# Patient Record
Sex: Male | Born: 1935 | Race: Black or African American | Hispanic: No | Smoking: Former smoker
Health system: Southern US, Community
[De-identification: ages and names within clinical notes are randomized; demographics above are authoritative.]

## PROBLEM LIST (undated history)

## (undated) DIAGNOSIS — N183 Chronic kidney disease, stage 3 unspecified: Secondary | ICD-10-CM

## (undated) DIAGNOSIS — Z91148 Patient's other noncompliance with medication regimen for other reason: Secondary | ICD-10-CM

## (undated) DIAGNOSIS — I441 Atrioventricular block, second degree: Secondary | ICD-10-CM

## (undated) DIAGNOSIS — E039 Hypothyroidism, unspecified: Secondary | ICD-10-CM

## (undated) DIAGNOSIS — I2510 Atherosclerotic heart disease of native coronary artery without angina pectoris: Secondary | ICD-10-CM

## (undated) DIAGNOSIS — Z9114 Patient's other noncompliance with medication regimen: Secondary | ICD-10-CM

## (undated) DIAGNOSIS — I10 Essential (primary) hypertension: Secondary | ICD-10-CM

## (undated) DIAGNOSIS — E785 Hyperlipidemia, unspecified: Secondary | ICD-10-CM

## (undated) DIAGNOSIS — I340 Nonrheumatic mitral (valve) insufficiency: Secondary | ICD-10-CM

## (undated) DIAGNOSIS — H9190 Unspecified hearing loss, unspecified ear: Secondary | ICD-10-CM

## (undated) DIAGNOSIS — I5022 Chronic systolic (congestive) heart failure: Secondary | ICD-10-CM

## (undated) DIAGNOSIS — I4819 Other persistent atrial fibrillation: Secondary | ICD-10-CM

## (undated) DIAGNOSIS — I7300 Raynaud's syndrome without gangrene: Secondary | ICD-10-CM

## (undated) DIAGNOSIS — F4310 Post-traumatic stress disorder, unspecified: Secondary | ICD-10-CM

## (undated) HISTORY — PX: INGUINAL HERNIA REPAIR: SUR1180

## (undated) HISTORY — DX: Atrioventricular block, second degree: I44.1

## (undated) HISTORY — DX: Patient's other noncompliance with medication regimen for other reason: Z91.148

## (undated) HISTORY — PX: APPENDECTOMY: SHX54

## (undated) HISTORY — DX: Patient's other noncompliance with medication regimen: Z91.14

## (undated) HISTORY — DX: Other persistent atrial fibrillation: I48.19

## (undated) HISTORY — DX: Atherosclerotic heart disease of native coronary artery without angina pectoris: I25.10

## (undated) HISTORY — DX: Nonrheumatic mitral (valve) insufficiency: I34.0

## (undated) HISTORY — DX: Essential (primary) hypertension: I10

## (undated) HISTORY — DX: Hyperlipidemia, unspecified: E78.5

## (undated) HISTORY — PX: PROSTATECTOMY: SHX69

## (undated) HISTORY — PX: TONSILLECTOMY: SUR1361

---

## 1997-11-05 ENCOUNTER — Encounter (HOSPITAL_COMMUNITY): Admission: RE | Admit: 1997-11-05 | Discharge: 1998-02-03 | Payer: Self-pay | Admitting: Cardiology

## 1997-12-12 ENCOUNTER — Other Ambulatory Visit: Admission: RE | Admit: 1997-12-12 | Discharge: 1997-12-12 | Payer: Self-pay | Admitting: Cardiology

## 1998-02-03 ENCOUNTER — Encounter (HOSPITAL_COMMUNITY): Admission: RE | Admit: 1998-02-03 | Discharge: 1998-05-04 | Payer: Self-pay | Admitting: Cardiology

## 1998-05-05 ENCOUNTER — Encounter (HOSPITAL_COMMUNITY): Admission: RE | Admit: 1998-05-05 | Discharge: 1998-08-03 | Payer: Self-pay | Admitting: Cardiology

## 1998-05-23 ENCOUNTER — Other Ambulatory Visit: Admission: RE | Admit: 1998-05-23 | Discharge: 1998-05-23 | Payer: Self-pay | Admitting: Family Medicine

## 1998-06-05 ENCOUNTER — Ambulatory Visit (HOSPITAL_COMMUNITY): Admission: RE | Admit: 1998-06-05 | Discharge: 1998-06-05 | Payer: Self-pay | Admitting: Gastroenterology

## 1998-08-04 ENCOUNTER — Encounter (HOSPITAL_COMMUNITY): Admission: RE | Admit: 1998-08-04 | Discharge: 1998-11-02 | Payer: Self-pay | Admitting: Cardiology

## 1998-08-31 ENCOUNTER — Ambulatory Visit (HOSPITAL_BASED_OUTPATIENT_CLINIC_OR_DEPARTMENT_OTHER): Admission: RE | Admit: 1998-08-31 | Discharge: 1998-08-31 | Payer: Self-pay | Admitting: General Surgery

## 1998-09-02 ENCOUNTER — Emergency Department (HOSPITAL_COMMUNITY): Admission: EM | Admit: 1998-09-02 | Discharge: 1998-09-02 | Payer: Self-pay | Admitting: Emergency Medicine

## 1998-11-03 ENCOUNTER — Encounter (HOSPITAL_COMMUNITY): Admission: RE | Admit: 1998-11-03 | Discharge: 1999-02-01 | Payer: Self-pay | Admitting: Cardiology

## 1999-02-02 ENCOUNTER — Encounter (HOSPITAL_COMMUNITY): Admission: RE | Admit: 1999-02-02 | Discharge: 1999-03-05 | Payer: Self-pay | Admitting: Cardiology

## 1999-03-06 ENCOUNTER — Encounter (HOSPITAL_COMMUNITY): Admission: RE | Admit: 1999-03-06 | Discharge: 1999-06-04 | Payer: Self-pay | Admitting: Cardiology

## 1999-09-25 ENCOUNTER — Encounter: Admission: RE | Admit: 1999-09-25 | Discharge: 1999-09-25 | Payer: Self-pay | Admitting: Orthopedic Surgery

## 1999-09-25 ENCOUNTER — Encounter: Payer: Self-pay | Admitting: Orthopedic Surgery

## 2000-11-25 ENCOUNTER — Encounter: Payer: Self-pay | Admitting: Emergency Medicine

## 2000-11-25 ENCOUNTER — Emergency Department (HOSPITAL_COMMUNITY): Admission: EM | Admit: 2000-11-25 | Discharge: 2000-11-25 | Payer: Self-pay | Admitting: *Deleted

## 2000-11-28 ENCOUNTER — Emergency Department (HOSPITAL_COMMUNITY): Admission: EM | Admit: 2000-11-28 | Discharge: 2000-11-28 | Payer: Self-pay | Admitting: Emergency Medicine

## 2000-12-02 ENCOUNTER — Emergency Department (HOSPITAL_COMMUNITY): Admission: EM | Admit: 2000-12-02 | Discharge: 2000-12-02 | Payer: Self-pay | Admitting: Emergency Medicine

## 2001-03-04 ENCOUNTER — Encounter: Admission: RE | Admit: 2001-03-04 | Discharge: 2001-03-04 | Payer: Self-pay | Admitting: Orthopedic Surgery

## 2001-03-04 ENCOUNTER — Encounter: Payer: Self-pay | Admitting: Orthopedic Surgery

## 2001-08-05 HISTORY — PX: CARDIAC CATHETERIZATION: SHX172

## 2001-09-01 ENCOUNTER — Encounter: Payer: Self-pay | Admitting: Orthopedic Surgery

## 2001-09-01 ENCOUNTER — Encounter: Admission: RE | Admit: 2001-09-01 | Discharge: 2001-09-01 | Payer: Self-pay | Admitting: Orthopedic Surgery

## 2001-09-02 ENCOUNTER — Ambulatory Visit (HOSPITAL_BASED_OUTPATIENT_CLINIC_OR_DEPARTMENT_OTHER): Admission: RE | Admit: 2001-09-02 | Discharge: 2001-09-03 | Payer: Self-pay | Admitting: Orthopedic Surgery

## 2001-09-17 ENCOUNTER — Inpatient Hospital Stay (HOSPITAL_COMMUNITY): Admission: EM | Admit: 2001-09-17 | Discharge: 2001-09-19 | Payer: Self-pay | Admitting: Emergency Medicine

## 2001-09-17 ENCOUNTER — Encounter: Payer: Self-pay | Admitting: Emergency Medicine

## 2001-09-18 ENCOUNTER — Encounter: Payer: Self-pay | Admitting: Internal Medicine

## 2002-01-04 ENCOUNTER — Encounter: Payer: Self-pay | Admitting: Family Medicine

## 2002-01-04 ENCOUNTER — Ambulatory Visit (HOSPITAL_COMMUNITY): Admission: RE | Admit: 2002-01-04 | Discharge: 2002-01-04 | Payer: Self-pay | Admitting: Family Medicine

## 2003-09-19 ENCOUNTER — Encounter: Admission: RE | Admit: 2003-09-19 | Discharge: 2003-09-19 | Payer: Self-pay | Admitting: Orthopedic Surgery

## 2004-01-18 ENCOUNTER — Encounter: Admission: RE | Admit: 2004-01-18 | Discharge: 2004-01-18 | Payer: Self-pay | Admitting: Orthopedic Surgery

## 2004-01-20 ENCOUNTER — Ambulatory Visit (HOSPITAL_COMMUNITY): Admission: RE | Admit: 2004-01-20 | Discharge: 2004-01-20 | Payer: Self-pay | Admitting: Orthopedic Surgery

## 2004-01-20 ENCOUNTER — Ambulatory Visit (HOSPITAL_BASED_OUTPATIENT_CLINIC_OR_DEPARTMENT_OTHER): Admission: RE | Admit: 2004-01-20 | Discharge: 2004-01-20 | Payer: Self-pay | Admitting: Orthopedic Surgery

## 2004-10-01 ENCOUNTER — Ambulatory Visit (HOSPITAL_COMMUNITY): Admission: RE | Admit: 2004-10-01 | Discharge: 2004-10-01 | Payer: Self-pay | Admitting: Family Medicine

## 2006-11-04 ENCOUNTER — Emergency Department (HOSPITAL_COMMUNITY): Admission: EM | Admit: 2006-11-04 | Discharge: 2006-11-04 | Payer: Self-pay | Admitting: Emergency Medicine

## 2007-10-14 ENCOUNTER — Inpatient Hospital Stay (HOSPITAL_COMMUNITY): Admission: RE | Admit: 2007-10-14 | Discharge: 2007-10-19 | Payer: Self-pay | Admitting: Urology

## 2007-10-14 ENCOUNTER — Encounter (INDEPENDENT_AMBULATORY_CARE_PROVIDER_SITE_OTHER): Payer: Self-pay | Admitting: Urology

## 2007-11-03 ENCOUNTER — Emergency Department (HOSPITAL_COMMUNITY): Admission: EM | Admit: 2007-11-03 | Discharge: 2007-11-04 | Payer: Self-pay | Admitting: Emergency Medicine

## 2008-10-02 ENCOUNTER — Emergency Department (HOSPITAL_COMMUNITY): Admission: EM | Admit: 2008-10-02 | Discharge: 2008-10-02 | Payer: Self-pay | Admitting: Emergency Medicine

## 2009-01-02 ENCOUNTER — Emergency Department (HOSPITAL_COMMUNITY): Admission: EM | Admit: 2009-01-02 | Discharge: 2009-01-02 | Payer: Self-pay | Admitting: Emergency Medicine

## 2010-06-19 ENCOUNTER — Ambulatory Visit: Payer: Self-pay | Admitting: Cardiology

## 2010-07-12 ENCOUNTER — Ambulatory Visit: Payer: Self-pay | Admitting: Cardiology

## 2010-08-27 ENCOUNTER — Ambulatory Visit: Payer: Self-pay | Admitting: Cardiology

## 2010-09-10 ENCOUNTER — Ambulatory Visit (INDEPENDENT_AMBULATORY_CARE_PROVIDER_SITE_OTHER): Payer: Medicare Other | Admitting: *Deleted

## 2010-09-10 DIAGNOSIS — R079 Chest pain, unspecified: Secondary | ICD-10-CM

## 2010-09-20 ENCOUNTER — Telehealth (INDEPENDENT_AMBULATORY_CARE_PROVIDER_SITE_OTHER): Payer: Self-pay | Admitting: Radiology

## 2010-09-24 ENCOUNTER — Encounter: Payer: Self-pay | Admitting: Internal Medicine

## 2010-09-24 ENCOUNTER — Ambulatory Visit (HOSPITAL_COMMUNITY): Payer: Medicare Other | Attending: Cardiovascular Disease

## 2010-09-24 DIAGNOSIS — R0602 Shortness of breath: Secondary | ICD-10-CM

## 2010-09-24 DIAGNOSIS — I4949 Other premature depolarization: Secondary | ICD-10-CM

## 2010-09-24 DIAGNOSIS — R0989 Other specified symptoms and signs involving the circulatory and respiratory systems: Secondary | ICD-10-CM

## 2010-09-24 DIAGNOSIS — R079 Chest pain, unspecified: Secondary | ICD-10-CM | POA: Insufficient documentation

## 2010-09-26 NOTE — Progress Notes (Signed)
Summary: nuc pre-procedure  Phone Note Outgoing Call   Call placed by: Harlow Asa CNMT Call placed to: Patient Reason for Call: Confirm/change Appt Summary of Call: Left message with information on My[employer] Information Sheet (see scanned document for details).       Nuclear Med Background Indications for Stress Test: Evaluation for Ischemia   History: Heart Catheterization, Myocardial Infarction, Myocardial Perfusion Study  History Comments: MI- remote lateral MI; '03 Cath. occ. LCX- treated medically; '07 MPS EF=44% (-) isch.,large fixed defect; chronic mitral insuff.;CHF  Symptoms: Chest Pain, Dizziness, SOB    Nuclear Pre-Procedure Cardiac Risk Factors: Hypertension, Lipids

## 2010-10-02 NOTE — Assessment & Plan Note (Signed)
Summary: Cardiology Nuclear Testing  Nuclear Med Background Indications for Stress Test: Evaluation for Ischemia   History: Abnormal EKG, Heart Catheterization, Myocardial Infarction, Myocardial Perfusion Study  History Comments: MI- remote lateral MI; '03 Cath. occ. LCX- treated medically; '07 MPS EF=44% (-) isch.,large fixed defect; chronic mitral insuff.;CHF  Symptoms: Chest Pain, Dizziness, Palpitations, SOB    Nuclear Pre-Procedure Cardiac Risk Factors: Hypertension, Lipids Caffeine/Decaff Intake: None NPO After: 5:30 AM Lungs: clear IV 0.9% NS with Angio Cath: 20g     IV Site: R Hand IV Started by: Stanton Kidney, EMT-P Chest Size (in) 42     Height (in): 70.5 Weight (lb): 198 BMI: 28.11  Nuclear Med Study 1 or 2 day study:  1 day     Stress Test Type:  Eugenie Birks Reading MD:  Dietrich Pates, MD     Referring MD:  Waldon Reining Reade([place]) Resting Radionuclide:  Technetium 46m Tetrofosmin     Resting Radionuclide Dose:  10.9 mCi  Stress Radionuclide:  Technetium 50m Tetrofosmin     Stress Radionuclide Dose:  33 mCi   Stress Protocol  Max Systolic BP: 151 mm Hg Lexiscan: 0.4 mg   Stress Test Technologist:  Milana Na, EMT-P     Nuclear Technologist:  Domenic Polite, CNMT  Rest Procedure  Myocardial perfusion imaging was performed at rest 45 minutes following the intravenous administration of Technetium 95m Tetrofosmin.  Stress Procedure  The patient received IV Lexiscan 0.4 mg over 15-seconds.  2nd degree avb at baseline. Technetium 29m Tetrofosmin injected at 30-seconds.  There were no significant changes and rare pvcs with infusion.  Quantitative spect images were obtained after a 45 minute delay.  QPS Raw Data Images:  Diaphragm underlies heart.  Images were motion corrected Stress Images:  Defect in the inferior wall (base, mid, distal), lateral wall (base, mid), inferolateral wall (base, mid, distal) and thinning of apex. Rest Images:  no significant change  from the stress images. Subtraction (SDS):  No evidence of ischemia. Transient Ischemic Dilatation:  1.09  (Normal <1.22)  Lung/Heart Ratio:  .34  (Normal <0.45)  Quantitative Gated Spect Images QGS EDV:  163 ml QGS ESV:  97 ml QGS EF:  40 %   Overall Impression  Exercise Capacity: Lexiscan with no exercise. BP Response: Normal blood pressure response. Clinical Symptoms: Chest tightness ECG Impression: No significant ST segment change suggestive of ischemia. Overall Impression Comments: Scar and possible soft tissue attenuation in the inferior, inferolateral, lateral  wall.  No ischemia.

## 2010-10-17 ENCOUNTER — Other Ambulatory Visit: Payer: Self-pay | Admitting: Gastroenterology

## 2010-11-13 LAB — URINALYSIS, ROUTINE W REFLEX MICROSCOPIC
Bilirubin Urine: NEGATIVE
Ketones, ur: NEGATIVE mg/dL
Nitrite: NEGATIVE
pH: 5.5 (ref 5.0–8.0)

## 2010-11-13 LAB — URINE MICROSCOPIC-ADD ON

## 2010-11-20 LAB — COMPREHENSIVE METABOLIC PANEL
ALT: 22 U/L (ref 0–53)
Alkaline Phosphatase: 88 U/L (ref 39–117)
CO2: 21 mEq/L (ref 19–32)
Chloride: 106 mEq/L (ref 96–112)
GFR calc non Af Amer: 59 mL/min — ABNORMAL LOW (ref >60)
Glucose, Bld: 113 mg/dL — ABNORMAL HIGH (ref 70–99)
Potassium: 4.3 mEq/L (ref 3.5–5.1)
Sodium: 134 mEq/L — ABNORMAL LOW (ref 135–145)
Total Bilirubin: 0.5 mg/dL (ref 0.3–1.2)

## 2010-11-20 LAB — CBC
Hemoglobin: 15.9 g/dL (ref 13.0–17.0)
MCHC: 33.9 g/dL (ref 30.0–36.0)
RBC: 5.62 MIL/uL (ref 4.22–5.81)
WBC: 8 10*3/uL (ref 4.0–10.5)

## 2010-11-20 LAB — POCT I-STAT, CHEM 8
Calcium, Ion: 1.04 mmol/L — ABNORMAL LOW (ref 1.12–1.32)
Chloride: 109 mEq/L (ref 96–112)
Creatinine, Ser: 1.3 mg/dL (ref 0.4–1.5)
Glucose, Bld: 121 mg/dL — ABNORMAL HIGH (ref 70–99)
HCT: 51 % (ref 39.0–52.0)
Potassium: 4.4 mEq/L (ref 3.5–5.1)

## 2010-11-20 LAB — PROTIME-INR: Prothrombin Time: 13 seconds (ref 11.6–15.2)

## 2010-11-20 LAB — DIFFERENTIAL
Basophils Relative: 1 % (ref 0–1)
Eosinophils Absolute: 0.1 10*3/uL (ref 0.0–0.7)
Eosinophils Relative: 1 % (ref 0–5)
Neutrophils Relative %: 61 % (ref 43–77)

## 2010-11-20 LAB — URINE CULTURE

## 2010-11-20 LAB — LIPASE, BLOOD: Lipase: 26 U/L (ref 11–59)

## 2010-11-20 LAB — URINE MICROSCOPIC-ADD ON

## 2010-11-20 LAB — URINALYSIS, ROUTINE W REFLEX MICROSCOPIC
Bilirubin Urine: NEGATIVE
Glucose, UA: NEGATIVE mg/dL
Hgb urine dipstick: NEGATIVE
Protein, ur: 100 mg/dL — AB
Specific Gravity, Urine: 1.03 (ref 1.005–1.030)
Urobilinogen, UA: 0.2 mg/dL (ref 0.0–1.0)

## 2010-12-12 ENCOUNTER — Encounter: Payer: Self-pay | Admitting: Cardiology

## 2010-12-12 DIAGNOSIS — E785 Hyperlipidemia, unspecified: Secondary | ICD-10-CM | POA: Insufficient documentation

## 2010-12-12 DIAGNOSIS — R42 Dizziness and giddiness: Secondary | ICD-10-CM | POA: Insufficient documentation

## 2010-12-12 DIAGNOSIS — I2510 Atherosclerotic heart disease of native coronary artery without angina pectoris: Secondary | ICD-10-CM | POA: Insufficient documentation

## 2010-12-12 DIAGNOSIS — I7300 Raynaud's syndrome without gangrene: Secondary | ICD-10-CM | POA: Insufficient documentation

## 2010-12-12 DIAGNOSIS — I252 Old myocardial infarction: Secondary | ICD-10-CM | POA: Insufficient documentation

## 2010-12-12 DIAGNOSIS — I509 Heart failure, unspecified: Secondary | ICD-10-CM | POA: Insufficient documentation

## 2010-12-12 DIAGNOSIS — I441 Atrioventricular block, second degree: Secondary | ICD-10-CM | POA: Insufficient documentation

## 2010-12-12 DIAGNOSIS — I340 Nonrheumatic mitral (valve) insufficiency: Secondary | ICD-10-CM | POA: Insufficient documentation

## 2010-12-12 DIAGNOSIS — I10 Essential (primary) hypertension: Secondary | ICD-10-CM | POA: Insufficient documentation

## 2010-12-17 ENCOUNTER — Ambulatory Visit: Payer: Medicare Other | Admitting: Cardiology

## 2010-12-18 NOTE — Discharge Summary (Signed)
NAMEVEARL, ALLBAUGH              ACCOUNT NO.:  [id number]   MEDICAL RECORD NO.:  [id number]          PATIENT TYPE:  INP   LOCATION:  1512                         FACILITY:  [facility]   PHYSICIAN:  Boston Service, M.D.DATE OF BIRTH:  [birth year]   DATE OF ADMISSION:  10/14/2007  DATE OF DISCHARGE:  10/19/2007                               DISCHARGE SUMMARY   Indications, past history, medications, allergies, past medical history,  past social history, physical exam and review of systems were all  outlined in the admitting note.   HOSPITAL COURSE:  The patient was admitted October 14, 2007.  An 77-year-  old male with 80 gram prostate, urinary retention, postvoid residual of  [id number] mL, underwent TURP.  Due to his elevated postvoid, a Bonanno SP  tube was left in at the time of surgery.  Postop day #1, the patient  managed to pull out his Ainsworth catheter, which had been used for  continuous bladder irrigation in through the [place] tube and out  through the [place].  It was discarded and replaced with a three-way  Foley catheter.  Bleeding, which had occurred at the time of the patient  pulling out the catheter, resolved with a three-way catheter.  By postop  day #2, we were able to discontinue the continuous bladder irrigation  and by postop day #3, Foley catheter was discontinued at that point, the  patient refused to go home.  He voided on his own on several occasions  and the suprapubic tube was used to check the postvoid residuals.  Voided volumes ranged between about 300 and 600 mL, postvoids strange  between about 300 and in 500 mL.  By postop day #5, the patient was  voiding increasing volumes, residuals were decreasing and it was felt  that combination of his age, 65 years, and hypotonic bladder was slowly  resolving.  He was discharged home October 19, 2007.  Final path on tissue  removed returned BPH.   DISCHARGE MEDICATIONS:  Vicodin, Macrobid with p.r.n. finasteride  and  Flomax.   PLAN:  Will see him back in the office in about a week with a voiding  diary.  Will make a decision at that time about Bonanno SP tube removal.           ______________________________  Boston Service, M.D.     RH/MEDQ  D:  10/29/2007  T:  10/30/2007  Job:  [id number]   cc:   Peter M. [place], M.D.  Fax: [phone]

## 2010-12-18 NOTE — Op Note (Signed)
NAMEDUFF, POZZI              ACCOUNT NO.:  [id number]   MEDICAL RECORD NO.:  [id number]          PATIENT TYPE:  INP   LOCATION:  0004                         FACILITY:  [facility]   PHYSICIAN:  Boston Service, M.D.DATE OF BIRTH:  May 14, 1919   DATE OF PROCEDURE:  10/14/2007  DATE OF DISCHARGE:                               OPERATIVE REPORT   I have reviewed office notes from September 29, 2007.  Appreciate  preoperative clearance by Peter Swaziland, M.D.   PREOPERATIVE DIAGNOSES:  1. Benign prostatic hypertrophy.  2. Urinary retention, postvoid residuals between 800 and 1000 mL      despite Avodart and Flomax therapy.  3. Previous biopsy x3 showed an enlarged, 80 grams, prostate, but no      evidence of cancer.  The PSA pattern recently 11.4, 12.2,  11.8, 12, and 12.7.   POSTOPERATIVE DIAGNOSES:  1. Benign prostatic hypertrophy.  2. Urinary retention, postvoid residuals between 800 and 1000 mL      despite Avodart and Flomax therapy.  3. Previous biopsy x3 showed an enlarged, 80 grams, prostate, but no      evidence of cancer.  The PSA pattern recently 11.4, 12.2,  11.8, 12, and 12.7.   PROCEDURE:  1. Cystoscopy.  2. Bonanno SP tube placement.  3. Transurethral resection of the prostate.  4. Resection and vaporization.  Gyrus  system utilized.   SURGEON:  Boston Service, M.D.   ANESTHESIA:  Spinal.   FINDINGS:  Benign prostatic hypertrophy.   SPECIMENS:  Transurethral resection chips.   ESTIMATED BLOOD LOSS:  Per Anesthesia,  less than 100 mL.   COMPLICATIONS:  None obvious.   DESCRIPTION OF PROCEDURE:  The patient was prepped and draped in the  dorsal lithotomy position after institution of adequate level of general  anesthesia.  Well lubricated 21-French panendoscope was gently inserted  at the urethral meatus.  Normal urethra and sphincter, elongated  prostatic urethra, coapting lateral lobes, densely trabeculated, large  capacity bladder.  Difficulty  visualizing orifices mainly due to  enlarged median tissue.  With the patient in steep Trendelenburg  position, bladder was filled to capacity, about 1000 mL.  Spinal needle  was passed one fingerbreadth above the symphysis pubis with immediate  return of urine.  Bonanno SP tube was then passed adjacent to the spinal  needle with immediate return of urine, advanced into the bladder  position.  Anterior bladder wall confirmed cystoscopically, sewn in  place with 4-0 nylon.   Resectoscope sheath was then inserted.  Resection was initiated with a  single furrow at the 6 o'clock position to allow free efflux of chips.  Gyrus  bipolar system was used.  Resection was then carried from the 10  o'clock position down to the 6 o'clock position on the right lobe, from  the 2 o'clock position on the left lobe, down to the 6 o'clock position.  Small amount of tissue was resected anteriorly and the capsular  perforations were noted.  Once resection had been carried down to the  capsule, the loop element was replaced with the Gyrus nob  which on  the  cautery setting was used to smooth out the floor of the prostatic  urethra and obtain adequate hemostasis.  Chips were then irrigated free  from the bladder.  Bladder was filled to capacity.  Resectoscope sheath  was withdrawn.  Ainsworth catheter was inserted over a stylet with  immediate return of several hundred ml of irrigant, 30 mL in the  balloon.  Catheter was left to light traction.  Continuous bladder  irrigation set up into the [place] and out through the [place].  The  patient returned to recovery in satisfactory condition.           ______________________________  Boston Service, M.D.     RH/MEDQ  D:  10/14/2007  T:  10/15/2007  Job:  [id number]   cc:   Peter M. [place], M.D.  Fax: [phone]

## 2010-12-21 NOTE — Op Note (Signed)
[place]  Patient:    HEBER, HOOG Visit Number: [id number] MRN: [id number]          Service Type: DSU Location: [facility] Attending Physician:  Teena Dunk Dictated by:   Sharlot Gowda., M.D. Proc. Date: 09/02/01 Admit Date:  09/02/2001                             Operative Report  PREOPERATIVE DIAGNOSES: 1. Severe rotator cuff tear, right shoulder. 2. Degenerative tear in anterior and superior labrum. 3. Impingement and acromioclavicular joint arthritis.  POSTOPERATIVE DIAGNOSES: 1. Severe rotator cuff tear, right shoulder. 2. Degenerative tear in anterior and superior labrum. 3. Impingement and acromioclavicular joint arthritis.  OPERATION: 1. Open rotator cuff repair with acromioplasty. 2. Laparoscopic debridement of torn labrum. 3. Open distal clavicle excision.  SURGEON:  Sharlot Gowda., M.D.  ASSISTANT:  Arnoldo Morale, P.A.  INDICATIONS:  This is an 77 year old male with a severe rotator cuff tear not responding to conservative treatment, thought to be amenable to repair as an overnight.  DESCRIPTION OF PROCEDURE:  Arthroscope through posterior and anterior portal. Systematic inspection of the shoulder showed a severe rotator cuff tear with a complete tear of the supraspinatus and a portion of the infraspinatus, not really off the bone.  There was a sleeve of tissue left.  There was a degenerative tear of the anterior and superior labrum that was debrided.  Cuff tissue given the patients age was relatively good, although it was fibrillated on the under surface as well with some horizontal cleavage component.  After arthroscopy and debridement of the torn labrum, the procedure was converted to an open procedure with excision of an extremely large hypertrophic AC joint.  Additionally, the acromion was extremely thickened.  It was resected of about 1 cm of bone on its under surface and distal 1.5 cm  of the clavicle was excised.  This revealed a large cuff tear. The edges were not severely retracted.  The edges were freshened.  Two Arthrex anchors were placed, each with two #2 fiberwire sutures.  Prior to this, an apex stitch, the orientation of which was anterior-to-posterior was placed and left alone.  Two anchors were placed, each with two sutures for a total of four sutures, and repaired the cuff tissue back down to roughened bone.  This was followed by oversewing of the cuff back to the greater tuberosity area with #2 fiberwire.  The deltoid was meticulously repaired with #1 Tycron including one suture through drill holes, subcutaneous tissues with Vicryl, and the skin with the stapling device.  Marcaine with epinephrine infiltrated in the wound, 0.25%.  The patient had a preoperative stellate block.  He was taken to the recovery room in stable condition. Dictated by:   Sharlot Gowda., M.D. Attending Physician:  Teena Dunk DD:  09/02/01 TD:  09/03/01 Job: [id number]

## 2010-12-21 NOTE — Op Note (Signed)
NAME:  Craig [name], Craig [name]                        ACCOUNT NO.:  [id number]   MEDICAL RECORD NO.:  [id number]                   PATIENT TYPE:  AMB   LOCATION:  DSC                                  FACILITY:  [facility]   PHYSICIAN:  Thera Flake., M.D.             DATE OF BIRTH:  29-Apr-1919   DATE OF PROCEDURE:  01/20/2004  DATE OF DISCHARGE:                                 OPERATIVE REPORT   INDICATIONS:  This is an 77 year old male with symptomatic rotator cuff tear  for greater than six months, thought to be amenable to outpatient surgery  and overnight hospitalization.   PREOPERATIVE DIAGNOSES:  1. Large anterior tear, supraspinatus tendon.  2. Degenerative tear in the anterior-superior labrum.  3. Severe impingement with acromioclavicular joint arthritis.   POSTOPERATIVE DIAGNOSES:  1. Large anterior tear, supraspinatus tendon.  2. Degenerative tear in the anterior-superior labrum.  3. Severe impingement with acromioclavicular joint arthritis.   OPERATION:  1. Open rotator cuff tear with acromioplasty.  2. Arthroscopic debridement, torn labrum.  3. Open distal clavicle excision.   SURGEON:  Dyke Brackett, M.D.   ASSISTANTChestine Spore, P.A.   ANESTHESIA:  General/block.   DESCRIPTION OF PROCEDURE:  Arthroscope in the beach chair position after  general anesthesia and a block.  Large cuff tear was identified.  There was  degenerative tear in the anterior-superior labrum.  The biceps tendon was  degenerative but intact although questionable viability relative to  function.  Subscapularis was intact.  No severe degenerative change of the  joint, but rather significant fraying of the labrum.  Debridement of the  labrum as well as the undersurface of the cuff.  Advanced bony hypertrophy  of the [facility] joint and acromion was noted.  The procedure was next converted  into an open procedure.  Incision made bisecting the acromion and AC joint.  A large amount of osteophytes removed from  the anterior edge of the  acromion, relieving severe impingement and a very hypertrophic arthritic  distal clavicle over about 1 cm, revealing the cuff tear.  Bursectomy was  carried out.  Given the patient's age, though, the cuff tissue of the tear  was probably measuring about 5-6 cm.  It was relatively healthy tissue and  not retracted.  Debridement of nonviable tissue was carried out, freshening  of the edges of the tendon as well as burring the greater tuberosity.  Two  Arthrex anchors were used with oversewing the posterior edge of the tear  with a running #1 Ethibond creating essentially a watertight repair under no  tension.  The deltoid had been split by 2-3 cm distal to  the tip of the acromion made with #1 Ethibond including one suture placed  through drill holes.  The subcutaneous tissue was closed with 2-0 Vicryl and  the skin with a running Monocryl.  A lightly compressive sterile dressing  and __________ applied.  Taken to  the recovery room in stable condition.                                               Thera Flake., M.D.    WDC/MEDQ  D:  01/20/2004  T:  01/23/2004  Job:  [id number]

## 2010-12-21 NOTE — H&P (Signed)
[place]  Patient:    Craig [name], Craig [name] Visit Number: [id number] MRN: [id number]          Service Type: MED Location: 2000 2034 01 Attending Physician:  [place], Peter Manning Dictated by:   Darden Palmer., M.D. Admit Date:  09/17/2001   CC:         Peter M. [place], M.D.  Robert A. Eliezer Lofts., M.D.   History and Physical  REASON FOR ADMISSION:  Chest pain.  HISTORY OF PRESENT ILLNESS:  The patient is an 77 year old black male who was admitted to rule out a myocardial infarction because of chest pain.  The patient has a prior history of an acute posterolateral myocardial infarction in February 1995, complicated by pulmonary edema.  He has had some mild to moderate mitral regurgitation since then.  In 1997 he had a cardiac catheterization done because of an abnormal treadmill test, with findings of an occluded circumflex coronary artery and mild disease involving the right coronary artery and LAD.  He had done fairly well but had had episodic shortness of breath over the years.  He has moderate to severe mitral regurgitation, which is thought to be chronic and possibly related to his MI.  He recently had rotator cuff surgery two weeks ago.  He developed anterior chest pain after his first rehab session with his shoulder on Tuesday, and discomfort persisted throughout the evening and he was seen by Dr. Swaziland in the office yesterday.  An EKG was unremarkable.  There was an annoying, nagging discomfort.  His Imdur was increased, troponin and CPK were done, and he was sent home and told to take his usual pain medicines.  The discomfort abated, and he again went to rehab this morning.  After returning home from rehab, he again had the onset of anterior chest discomfort, which became severe.  His wife found him clutching his chest and brought him to the emergency room this afternoon.  He again took a pain pill and the symptoms gradually  abated but also abated somewhat after receiving nitroglycerin.  He is currently pain-free.  Because of the acute and progressive nature of the pain as an outpatient, he is admitted to rule out a myocardial infarction.  PAST MEDICAL HISTORY:  Remarkable for hypertension as well as hyperlipidemia. He has a history of prostatism.  There is no known history of diabetes.  PAST SURGICAL HISTORY:  Inguinal hernia repair and appendectomy.  ALLERGIES:  None.  MEDICATIONS:  Accupril 40 mg daily, Zocor 80 mg daily, Lasix 20 mg daily, aspirin daily, atenolol 50 mg daily.  FAMILY HISTORY:  Brother had a myocardial infarction at age 63.  SOCIAL HISTORY:  Currently lives with his wife.  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  He denies tobacco or alcohol abuse.  He moved here 14 years ago from [place].  REVIEW OF SYSTEMS:  He sees a urologist and has significant symptoms of prostatism.  He does not have any definite claudication or TIAs.  He has had significant shoulder surgery recently.  He has some difficulty moving his shoulder.  He is currently undergoing rehab with this.  Other than as noted above, the remainder of the review of systems is unremarkable.  PHYSICAL EXAMINATION:  VITAL SIGNS:  His blood pressure is 140/80, his pulse is 64 and regular.  GENERAL:  He is a pleasant, elderly black male, who appears friendly and appears younger than his  stated age.  HEENT:  He has hearing aids present in the right ear.  EOMI, PERRLA.  CNS clear.  Fundi definitely seen in the ED.  Pharynx negative.  NECK:  Supple without masses, no JVD, and no thyromegaly.  No bruits.  CHEST:  Lungs were clear bilaterally.  CARDIAC:  Normal S1 and S2.  There is a 2-3/6 systolic murmur at the apex.  ABDOMEN:  Soft, nontender, no hepatosplenomegaly, mass, or aneurysm.  EXTREMITIES:  Femoral pulses are 3+.  Peripheral pulses are 1+ on the right, 2+ dorsalis  pedis on the left.  No edema is noted.  NEUROLOGIC:   Normal.  LYMPHATIC:  Lymph nodes normal.  DIAGNOSTIC STUDIES:  A 12-lead ECG shows early precordial transition, ST elevation in V1 through V4, which is old and chronic, but no acute changes noted.  Chest x-ray is normal.  IMPRESSION: 1. Prolonged chest discomfort occurring at rest in a patient with known    coronary disease, rule out ischemia or myocardial infarction. 2. Recent rotator cuff repair. 3. Coronary artery disease with previous occlusion of the circumflex coronary    artery. 4. Mitral regurgitation, probably ischemic in past. 5. Treated hypertension. 6. Treated hyperlipidemia. 7. Prostatism.  RECOMMENDATIONS:  The patient will be placed in the hospital to rule out a myocardial infarction.  We will keep him NPO for further evaluation by Dr. Swaziland tomorrow.  The pain could be ischemic or could be referred pain from his recent rotator cuff surgery.  Because of the recent surgery, will not anticoagulate since he is pain-free.  Continue aspirin, beta blockers, and current medicines. Dictated by:   Darden Palmer., M.D. Attending Physician:  [place], Peter Manning DD:  09/17/01 TD:  09/18/01 Job: [id number]

## 2010-12-21 NOTE — Cardiovascular Report (Signed)
[place]  Patient:    GAMBLE, ENDERLE Visit Number: [id number] MRN: [id number]          Service Type: MED Location: 2000 2034 01 Attending Physician:  [place], Peter Manning Dictated by:   Peter M. [place], M.D. Proc. Date: 09/18/01 Admit Date:  09/17/2001   CC:         Molly Maduro A. Eliezer Lofts., M.D.  Sharlot Gowda., M.D.   Cardiac Catheterization  PROCEDURE:  Cardiac catheterization.  CARDIOLOGIST:  Peter M. [place], M.D.  INDICATION FOR PROCEDURE:  The patient is an 77 year old black male with remote posterior myocardial infarction who presents with refractory chest pain.  The patient had undergone recent left shoulder surgery.  ACCESS:  Via the right femoral artery and vein using standard Seldinger technique.  EQUIPMENT:  A 6-French 4 cm right and left Judkins catheters, 6-French pigtail, 6-French arterial sheath.  MEDICATIONS:  Local anesthesia with 1% Xylocaine.  CONTRAST: 125 cc of Omnipaque.  HEMODYNAMIC DATA:  Aortic pressure 114/62 with a mean of 82 mmHg.  Left ventricular pressure 120 with an EDP of 15 mmHg.  ANGIOGRAPHIC DATA:  Left coronary artery arises and distributes normally.  Left main coronary artery is normal.  Left anterior descending artery has minor wall irregularities in the proximal and mid vessel less than 20%.  The left circumflex coronary artery is occluded proximally.  There are left-to-left collaterals to the marginal vessels.  The right coronary artery arises and distributes normally.  It is a large dominant vessel.  In the proximal vessel, there are narrowings up to 40%. There are minor irregularities distally.  LEFT VENTRICULAR ANGIOGRAPHY:  Left ventricular angiography was performed in the RAO view.  This demonstrates hypokinesia of the inferior wall.  There is mildly depressed left ventricular function with ejection fraction estimated at 50%.  There is 2+ mitral  insufficiency.  FINAL INTERPRETATION: 1. Single-vessel occlusive atherosclerotic coronary artery disease. 2. Low-normal left ventricular function. 3. Mild to moderate mitral insufficiency.  PLAN:  Continue medical therapy.  This study is unchanged from 1997. Dictated by:   Peter M. [place], M.D. Attending Physician:  [place], Peter Manning DD:  09/18/01 TD:  09/19/01 Job: [id number]

## 2011-01-04 ENCOUNTER — Encounter: Payer: Self-pay | Admitting: Cardiology

## 2011-01-04 ENCOUNTER — Ambulatory Visit (INDEPENDENT_AMBULATORY_CARE_PROVIDER_SITE_OTHER): Payer: Medicare Other | Admitting: Cardiology

## 2011-01-04 ENCOUNTER — Ambulatory Visit: Payer: Medicare Other | Admitting: Cardiology

## 2011-01-04 DIAGNOSIS — I10 Essential (primary) hypertension: Secondary | ICD-10-CM

## 2011-01-04 DIAGNOSIS — I509 Heart failure, unspecified: Secondary | ICD-10-CM

## 2011-01-04 DIAGNOSIS — I2510 Atherosclerotic heart disease of native coronary artery without angina pectoris: Secondary | ICD-10-CM

## 2011-01-04 DIAGNOSIS — I441 Atrioventricular block, second degree: Secondary | ICD-10-CM

## 2011-01-04 NOTE — Assessment & Plan Note (Signed)
He is well compensated on exam. We'll continue with his current diuretic therapy. He is not a candidate for beta blocker therapy due to AV block. We will continue with his angiotensin receptor blocker. I've encouraged him to avoid salt intake.

## 2011-01-04 NOTE — Patient Instructions (Signed)
Happy [address] [place] Ouch!!!  Stay on the same medications.  Avoid salt.  Call me with the name of your new blood pressure medication.  I will see you back again in 4 months.

## 2011-01-04 NOTE — Assessment & Plan Note (Signed)
Asymptomatic. This was this exacerbated by beta blocker therapy.

## 2011-01-04 NOTE — Assessment & Plan Note (Signed)
I have asked that he call us with the name of his blood pressure medications so that we can reconcile this with his records.

## 2011-01-04 NOTE — Assessment & Plan Note (Signed)
He is having no significant anginal symptoms. We will continue with his current antianginal therapy and follow up again in 4 months.

## 2011-01-04 NOTE — Progress Notes (Signed)
   Craig [name] Date of Birth: 06/29/1919   History of Present Illness: [name] is seen today for followup. He states he is doing very well. He just turned 77 years old. He remains active with his gardening. He did have some recent problems with gout but this has subsided. He reports that Dr. Nicholos Johns put him on a new blood pressure medication but he does not know the name of this and his pharmacy records do not indicate a new medication. He denies any dyspnea, chest pain, or increased edema.  Current Outpatient Prescriptions on File Prior to Visit  Medication Sig Dispense Refill  . amLODipine (NORVASC) 5 MG tablet Take 5 mg by mouth daily.        Marland Kitchen aspirin 325 MG tablet Take 325 mg by mouth daily.        . Coenzyme Q10 (COQ10 PO) Take by mouth daily.        . finasteride (PROSCAR) 5 MG tablet Take 5 mg by mouth as needed.        . furosemide (LASIX) 20 MG tablet Take 20 mg by mouth as needed.        . isosorbide mononitrate (IMDUR) 30 MG 24 hr tablet Take 30 mg by mouth daily.        Marland Kitchen losartan-hydrochlorothiazide (HYZAAR) 100-25 MG per tablet Take 1 tablet by mouth daily.        . rosuvastatin (CRESTOR) 10 MG tablet Take 10 mg by mouth daily.          Allergies  Allergen Reactions  . Zocor (Simvastatin)     Past Medical History  Diagnosis Date  . MI, old   . Mitral insufficiency     CHRONIC  . CHF (congestive heart failure)     WITH MODERATE SYSTOLIC DYSFUNCTION  . Hypertension   . Hyperlipidemia   . 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 Coronary artery disease     WITH REMOTE LATERAL MYOCARDIAL INFARCTION  . CAD (coronary artery disease)     EJECTION FRACTION 44%  . Raynaud phenomenon   . Dizziness     Past Surgical History  Procedure Date  . Cardiac catheterization 2003    SHOWED OCCLUSION OF THE LEFT CIRCUMFLEX CORONARY ARTERY  . Inguinal hernia repair   . Appendectomy     History  Smoking status  . Former Smoker  . Quit date: 12/12/1970  Smokeless  tobacco  . Not on file    History  Alcohol Use No    Family History  Problem Relation Age of Onset  . Heart attack Brother     Review of Systems: The review of systems is positive for intermittent gout. He takes indomethacin for this every few days. All other systems were reviewed and are negative.  Physical Exam: BP 142/78  Pulse 60  Ht 5\' 10"  (1.778 m)  Wt 192 lb (87.091 kg)  BMI 27.55 kg/m2 He is a pleasant elderly white male in no acute distress. HEENT exam is unremarkable. He is hard of hearing. Neck is without JVD or bruits. Lungs are clear. Cardiac exam reveals a regular rate and rhythm with a grade 2/6 systolic murmur at the apex. There is no gallop. Abdomen is soft and nontender without masses. Extremities are without edema. His neurologic exam is grossly intact. LABORATORY DATA:   Assessment / Plan:

## 2011-01-08 ENCOUNTER — Telehealth: Payer: Self-pay | Admitting: *Deleted

## 2011-01-08 NOTE — Telephone Encounter (Signed)
Called to give [place] a list of his medications; Losartan 100 mg QD;isosorbide MN 30 mg qd; crestor 10 mg qd;amlopidine 5 mg qd; co q 10 qd; asa 325 mg qd. This matches list we have.

## 2011-01-31 ENCOUNTER — Other Ambulatory Visit: Payer: Self-pay | Admitting: Cardiology

## 2011-01-31 NOTE — Telephone Encounter (Signed)
escribe medication per fax request  

## 2011-02-09 ENCOUNTER — Emergency Department (HOSPITAL_COMMUNITY)
Admission: EM | Admit: 2011-02-09 | Discharge: 2011-02-10 | Disposition: A | Discharge status: Home / Self Care | Payer: Medicare Other | Attending: Emergency Medicine | Admitting: Emergency Medicine

## 2011-02-09 DIAGNOSIS — I2510 Atherosclerotic heart disease of native coronary artery without angina pectoris: Secondary | ICD-10-CM | POA: Insufficient documentation

## 2011-02-09 DIAGNOSIS — I10 Essential (primary) hypertension: Secondary | ICD-10-CM | POA: Insufficient documentation

## 2011-02-09 DIAGNOSIS — R319 Hematuria, unspecified: Secondary | ICD-10-CM | POA: Insufficient documentation

## 2011-02-09 DIAGNOSIS — N400 Enlarged prostate without lower urinary tract symptoms: Secondary | ICD-10-CM | POA: Insufficient documentation

## 2011-02-09 DIAGNOSIS — I252 Old myocardial infarction: Secondary | ICD-10-CM | POA: Insufficient documentation

## 2011-02-09 LAB — URINALYSIS, ROUTINE W REFLEX MICROSCOPIC
Bilirubin Urine: NEGATIVE
Glucose, UA: NEGATIVE mg/dL
Nitrite: NEGATIVE
Specific Gravity, Urine: 1.022 (ref 1.005–1.030)
pH: 5.5 (ref 5.0–8.0)

## 2011-02-11 LAB — URINE CULTURE
Colony Count: NO GROWTH
Culture  Setup Time: 201207081126
Culture: NO GROWTH

## 2011-03-23 ENCOUNTER — Other Ambulatory Visit: Payer: Self-pay | Admitting: Cardiology

## 2011-03-25 NOTE — Telephone Encounter (Signed)
escribe medication per fax request  

## 2011-04-29 LAB — CBC
HCT: 38.3 — ABNORMAL LOW
Platelets: 214
RDW: 14.9
WBC: 11.8 — ABNORMAL HIGH

## 2011-04-29 LAB — URINALYSIS, ROUTINE W REFLEX MICROSCOPIC
Bilirubin Urine: NEGATIVE
Nitrite: NEGATIVE
Specific Gravity, Urine: 1.023
Urobilinogen, UA: 0.2
pH: 6.5

## 2011-04-29 LAB — BASIC METABOLIC PANEL
Calcium: 9.1
Creatinine, Ser: 1.01
GFR calc Af Amer: >60
GFR calc non Af Amer: >60
Sodium: 137

## 2011-04-29 LAB — HEMOGLOBIN AND HEMATOCRIT, BLOOD: HCT: 40.6

## 2011-04-29 LAB — URINE MICROSCOPIC-ADD ON

## 2011-07-09 ENCOUNTER — Telehealth: Payer: Self-pay | Admitting: Cardiology

## 2011-07-09 NOTE — Telephone Encounter (Signed)
Patient called,wanting to know if he needs to continue Allopurinol.Advised to continue and he needs to call PCP for refills.

## 2011-07-09 NOTE — Telephone Encounter (Signed)
Pt was given Allopurinol 300mg  for gout and needs to know if he needs to continue the medication because he needs authurization to get it please [facility] [place] [facility]

## 2011-07-19 ENCOUNTER — Other Ambulatory Visit: Payer: Self-pay | Admitting: Otolaryngology

## 2011-07-19 DIAGNOSIS — H905 Unspecified sensorineural hearing loss: Secondary | ICD-10-CM

## 2011-07-31 ENCOUNTER — Inpatient Hospital Stay: Admission: RE | Admit: 2011-07-31 | Payer: Medicare Other | Source: Ambulatory Visit

## 2011-08-02 ENCOUNTER — Other Ambulatory Visit: Payer: Self-pay | Admitting: *Deleted

## 2011-08-02 MED ORDER — ROSUVASTATIN CALCIUM 10 MG PO TABS: 10.0000 mg | ORAL_TABLET | Freq: Every day | ORAL | Status: DC

## 2011-08-05 ENCOUNTER — Ambulatory Visit
Admission: RE | Admit: 2011-08-05 | Discharge: 2011-08-05 | Disposition: A | Discharge status: Home / Self Care | Payer: Medicare Other | Source: Ambulatory Visit | Attending: Otolaryngology | Admitting: Otolaryngology

## 2011-08-05 DIAGNOSIS — H905 Unspecified sensorineural hearing loss: Secondary | ICD-10-CM

## 2011-08-14 DIAGNOSIS — M109 Gout, unspecified: Secondary | ICD-10-CM | POA: Diagnosis not present

## 2011-08-14 DIAGNOSIS — I10 Essential (primary) hypertension: Secondary | ICD-10-CM | POA: Diagnosis not present

## 2011-08-15 DIAGNOSIS — L259 Unspecified contact dermatitis, unspecified cause: Secondary | ICD-10-CM | POA: Diagnosis not present

## 2011-08-19 ENCOUNTER — Other Ambulatory Visit: Payer: Self-pay | Admitting: *Deleted

## 2011-08-19 MED ORDER — AMLODIPINE BESYLATE 5 MG PO TABS: 5.0000 mg | ORAL_TABLET | Freq: Every day | ORAL | Status: DC

## 2011-08-29 ENCOUNTER — Other Ambulatory Visit: Payer: Self-pay

## 2011-08-29 ENCOUNTER — Ambulatory Visit (HOSPITAL_COMMUNITY): Payer: Medicare Other

## 2011-08-29 ENCOUNTER — Inpatient Hospital Stay (HOSPITAL_COMMUNITY)
Admission: EM | Admit: 2011-08-29 | Discharge: 2011-08-30 | DRG: 313 | Disposition: A | Discharge status: Home / Self Care | Payer: Medicare Other | Source: Ambulatory Visit | Attending: Internal Medicine | Admitting: Internal Medicine

## 2011-08-29 ENCOUNTER — Encounter (HOSPITAL_COMMUNITY): Payer: Self-pay | Admitting: Emergency Medicine

## 2011-08-29 ENCOUNTER — Encounter (HOSPITAL_COMMUNITY)
Admission: EM | Disposition: A | Discharge status: Home / Self Care | Payer: Self-pay | Source: Ambulatory Visit | Attending: Internal Medicine

## 2011-08-29 DIAGNOSIS — Z7982 Long term (current) use of aspirin: Secondary | ICD-10-CM | POA: Diagnosis not present

## 2011-08-29 DIAGNOSIS — R0789 Other chest pain: Secondary | ICD-10-CM | POA: Diagnosis not present

## 2011-08-29 DIAGNOSIS — R079 Chest pain, unspecified: Secondary | ICD-10-CM | POA: Diagnosis not present

## 2011-08-29 DIAGNOSIS — I10 Essential (primary) hypertension: Secondary | ICD-10-CM | POA: Diagnosis not present

## 2011-08-29 DIAGNOSIS — Z87891 Personal history of nicotine dependence: Secondary | ICD-10-CM

## 2011-08-29 DIAGNOSIS — R0602 Shortness of breath: Secondary | ICD-10-CM | POA: Diagnosis not present

## 2011-08-29 DIAGNOSIS — M949 Disorder of cartilage, unspecified: Secondary | ICD-10-CM | POA: Diagnosis present

## 2011-08-29 DIAGNOSIS — Z79899 Other long term (current) drug therapy: Secondary | ICD-10-CM | POA: Diagnosis not present

## 2011-08-29 DIAGNOSIS — I059 Rheumatic mitral valve disease, unspecified: Secondary | ICD-10-CM | POA: Diagnosis not present

## 2011-08-29 DIAGNOSIS — I509 Heart failure, unspecified: Secondary | ICD-10-CM | POA: Diagnosis present

## 2011-08-29 DIAGNOSIS — M109 Gout, unspecified: Secondary | ICD-10-CM | POA: Diagnosis not present

## 2011-08-29 DIAGNOSIS — I252 Old myocardial infarction: Secondary | ICD-10-CM | POA: Diagnosis not present

## 2011-08-29 DIAGNOSIS — E785 Hyperlipidemia, unspecified: Secondary | ICD-10-CM | POA: Diagnosis present

## 2011-08-29 DIAGNOSIS — I2510 Atherosclerotic heart disease of native coronary artery without angina pectoris: Secondary | ICD-10-CM | POA: Diagnosis not present

## 2011-08-29 DIAGNOSIS — M899 Disorder of bone, unspecified: Secondary | ICD-10-CM | POA: Diagnosis present

## 2011-08-29 DIAGNOSIS — I441 Atrioventricular block, second degree: Secondary | ICD-10-CM | POA: Diagnosis present

## 2011-08-29 DIAGNOSIS — Z888 Allergy status to other drugs, medicaments and biological substances status: Secondary | ICD-10-CM

## 2011-08-29 DIAGNOSIS — I7300 Raynaud's syndrome without gangrene: Secondary | ICD-10-CM | POA: Diagnosis not present

## 2011-08-29 HISTORY — DX: Raynaud's syndrome without gangrene: I73.00

## 2011-08-29 HISTORY — DX: Post-traumatic stress disorder, unspecified: F43.10

## 2011-08-29 HISTORY — PX: LEFT HEART CATHETERIZATION WITH CORONARY ANGIOGRAM: SHX5451

## 2011-08-29 HISTORY — DX: Unspecified hearing loss, unspecified ear: H91.90

## 2011-08-29 LAB — HEMOGLOBIN A1C: Hgb A1c MFr Bld: 6.5 % — ABNORMAL HIGH (ref <5.7)

## 2011-08-29 LAB — COMPREHENSIVE METABOLIC PANEL
ALT: 25 U/L (ref 0–53)
AST: 15 U/L (ref 0–37)
CO2: 26 mEq/L (ref 19–32)
Chloride: 105 mEq/L (ref 96–112)
GFR calc non Af Amer: 60 mL/min — ABNORMAL LOW (ref >90)
Potassium: 5 mEq/L (ref 3.5–5.1)
Sodium: 138 mEq/L (ref 135–145)
Total Bilirubin: 0.5 mg/dL (ref 0.3–1.2)

## 2011-08-29 LAB — DIFFERENTIAL
Lymphocytes Relative: 18 % (ref 12–46)
Lymphs Abs: 2.7 10*3/uL (ref 0.7–4.0)
Neutrophils Relative %: 69 % (ref 43–77)

## 2011-08-29 LAB — CARDIAC PANEL(CRET KIN+CKTOT+MB+TROPI)
Relative Index: INVALID (ref 0.0–2.5)
Total CK: 46 U/L (ref 7–232)

## 2011-08-29 LAB — POCT I-STAT, CHEM 8
Calcium, Ion: 1.12 mmol/L (ref 1.12–1.32)
Chloride: 108 mEq/L (ref 96–112)
Creatinine, Ser: 1.1 mg/dL (ref 0.50–1.35)
Glucose, Bld: 93 mg/dL (ref 70–99)
HCT: 38 % — ABNORMAL LOW (ref 39.0–52.0)

## 2011-08-29 LAB — LIPID PANEL
HDL: 46 mg/dL (ref >39)
LDL Cholesterol: 70 mg/dL (ref 0–99)
Total CHOL/HDL Ratio: 2.8 RATIO

## 2011-08-29 LAB — CBC
HCT: 37.5 % — ABNORMAL LOW (ref 39.0–52.0)
Hemoglobin: 12.7 g/dL — ABNORMAL LOW (ref 13.0–17.0)
MCH: 28.2 pg (ref 26.0–34.0)
MCHC: 33.9 g/dL (ref 30.0–36.0)
RDW: 15.4 % (ref 11.5–15.5)

## 2011-08-29 SURGERY — LEFT HEART CATHETERIZATION WITH CORONARY ANGIOGRAM
Anesthesia: LOCAL

## 2011-08-29 MED ORDER — ZOLPIDEM TARTRATE 5 MG PO TABS: 5.0000 mg | ORAL_TABLET | Freq: Every evening | ORAL | Status: DC | PRN

## 2011-08-29 MED ORDER — HEPARIN SOD (PORCINE) IN D5W 100 UNIT/ML IV SOLN
1300.0000 [IU]/h | INTRAVENOUS | Status: DC
  Administered 2011-08-29: 1100 [IU]/h via INTRAVENOUS
  Administered 2011-08-30: 1300 [IU]/h via INTRAVENOUS
  Filled 2011-08-29 (×3): qty 250

## 2011-08-29 MED ORDER — SODIUM CHLORIDE 0.9 % IJ SOLN: 3.0000 mL | Freq: Two times a day (BID) | INTRAMUSCULAR | Status: DC

## 2011-08-29 MED ORDER — ACETAMINOPHEN 325 MG PO TABS: 650.0000 mg | ORAL_TABLET | ORAL | Status: DC | PRN

## 2011-08-29 MED ORDER — ISOSORBIDE MONONITRATE ER 30 MG PO TB24
30.0000 mg | ORAL_TABLET | Freq: Every day | ORAL | Status: DC
  Administered 2011-08-30: 30 mg via ORAL
  Filled 2011-08-29: qty 1

## 2011-08-29 MED ORDER — FINASTERIDE 5 MG PO TABS
5.0000 mg | ORAL_TABLET | Freq: Every day | ORAL | Status: DC
  Administered 2011-08-30: 5 mg via ORAL
  Filled 2011-08-29: qty 1

## 2011-08-29 MED ORDER — HEPARIN BOLUS VIA INFUSION
4000.0000 [IU] | Freq: Once | INTRAVENOUS | Status: AC
  Administered 2011-08-29: 4000 [IU] via INTRAVENOUS

## 2011-08-29 MED ORDER — ASPIRIN 325 MG PO TABS
325.0000 mg | ORAL_TABLET | Freq: Every day | ORAL | Status: DC
  Administered 2011-08-30: 325 mg via ORAL
  Filled 2011-08-29: qty 1

## 2011-08-29 MED ORDER — AMLODIPINE BESYLATE 5 MG PO TABS
5.0000 mg | ORAL_TABLET | Freq: Every day | ORAL | Status: DC
  Administered 2011-08-30: 5 mg via ORAL
  Filled 2011-08-29: qty 1

## 2011-08-29 MED ORDER — FUROSEMIDE 20 MG PO TABS
20.0000 mg | ORAL_TABLET | Freq: Every day | ORAL | Status: DC
  Administered 2011-08-29 – 2011-08-30 (×2): 20 mg via ORAL
  Filled 2011-08-29: qty 1

## 2011-08-29 MED ORDER — COQ10 30 MG PO CAPS: 1.0000 | ORAL_CAPSULE | Freq: Every day | ORAL | Status: DC

## 2011-08-29 MED ORDER — NITROGLYCERIN 0.4 MG SL SUBL
0.4000 mg | SUBLINGUAL_TABLET | SUBLINGUAL | Status: DC | PRN
  Administered 2011-08-29: 0.4 mg via SUBLINGUAL
  Filled 2011-08-29: qty 25

## 2011-08-29 MED ORDER — ONDANSETRON HCL 4 MG/2ML IJ SOLN: 4.0000 mg | Freq: Four times a day (QID) | INTRAMUSCULAR | Status: DC | PRN

## 2011-08-29 MED ORDER — SODIUM CHLORIDE 0.9 % IV SOLN
250.0000 mL | INTRAVENOUS | Status: DC | PRN
  Administered 2011-08-29: 250 mL via INTRAVENOUS

## 2011-08-29 MED ORDER — ALLOPURINOL 300 MG PO TABS
300.0000 mg | ORAL_TABLET | Freq: Every day | ORAL | Status: DC
  Administered 2011-08-30: 300 mg via ORAL
  Filled 2011-08-29: qty 1

## 2011-08-29 MED ORDER — ALPRAZOLAM 0.25 MG PO TABS: 0.2500 mg | ORAL_TABLET | Freq: Two times a day (BID) | ORAL | Status: DC | PRN

## 2011-08-29 MED ORDER — ROSUVASTATIN CALCIUM 10 MG PO TABS
10.0000 mg | ORAL_TABLET | Freq: Every day | ORAL | Status: DC
  Filled 2011-08-29 (×2): qty 1

## 2011-08-29 MED ORDER — SODIUM CHLORIDE 0.9 % IJ SOLN: 3.0000 mL | INTRAMUSCULAR | Status: DC | PRN

## 2011-08-29 NOTE — Progress Notes (Signed)
Pt complaining of 5/10 chest discomfort while lying in bed. No radiating, no changes with palpitation. Worse with deep inspiration. 2 SL nitro administered and EKG obtained, revealing what appears to be complete heart block with HR in 60's. Dr. Gwendalyn Ege made aware of changes and will be by to see the patient shortly. VSS and no other complaints. Will continue to monitor. Duwaine Maxin, RN

## 2011-08-29 NOTE — H&P (Signed)
History and Physical  Patient ID: Craig [name] Patient ID: Craig [name] MRN: [id number], DOB/AGE: 03/31/1919 78 y.o. Date of Encounter: 08/29/2011  Primary Physician: [name], MD, MD Primary Cardiologist: [place]   Chief Complaint: chest pain  HPI: 78 year old male with a history of CAD. He is a good historian. He was in his usual state of health when he developed chest and abdominal pain several days ago. His symptoms were intermittent but he was also experiencing the pain most of the time. He did not try any medications specifically for the pain. He took all of his usual medications. He feels the symptoms may have been worse with exertion and feels that they would decrease some with rest. He feels like he was experiencing the pain most of the time. He also had significant dyspnea with it and reports increased dyspnea on exertion as well as PND but denies orthopnea. He has had no recent illnesses, fevers or chills. He mentions abdominal pain and when asked to localize the pain he points to his entire upper abdomen and chest area. There is no point tenderness or smaller area where he feels the pain is worse. His activity level has been significantly decreased all week because of the pain. He also feels short of breath has been significant. He has had no nausea, vomiting or diaphoresis. There is been no association with meals. He has had no recent travels and no lower extremity pain or swelling. He does not feel that any particular movement or deep inspiration make the pain worse. At its worst, the pain was a 7/10. He called his family physician because of the chest pain. He was seen today and sent by EMS to the emergency room. His ECG in the doctor's office was significantly different from an old ECG and a code STEMI was called. By arrival in the cath lab, he was pain free. Since his chest pain is currently a 0/10, and his ECG is unchanged from his most recent ECG, he will be  admitted to cardiology.   Past Medical History  Diagnosis Date  . MI, old   . Mitral insufficiency     CHRONIC  . CHF (congestive heart failure)     WITH MODERATE SYSTOLIC DYSFUNCTION  . Hypertension   . Hyperlipidemia   . 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 Coronary artery disease     WITH REMOTE LATERAL MYOCARDIAL INFARCTION  . CAD (coronary artery disease)     EJECTION FRACTION 44%   Gout   . Raynaud phenomenon   . Dizziness      Surgical History:  Past Surgical History  Procedure Date  . Cardiac catheterization 2003    Left main coronary artery is normal. Left anterior descending artery has minor wall irregularities in the proximal and mid vessel < 20%. The left circumflex coronary artery is occluded proximally.  There are left-to-left collaterals to the marginal vessels. The right coronary artery arises and distributes normally.  It is a large dominant vessel.  In the proximal vessel, there are narrowings up to 40%. There are minor irregularities distally.  LEFT VENTRICULAR ANGIOGRAPHY:  Left ventricular angiography was performed in the RAO view.  This demonstrates hypokinesia of the inferior wall.  There is mildly depressed left ventricular function with ejection fraction estimated at 50%.  There is 2+ mitral insufficiency. FINAL INTERPRETATION: 1. Single-vessel occlusive atherosclerotic coronary artery disease. 2. Low-normal left ventricular function. 3. Mild to moderate  mitral insufficiency.   Bilateral shoulder surgery   . Inguinal hernia repair   . Appendectomy   . Prostatectomy      I have reviewed the patient's current medications. Prior to Admission:   amLODipine (NORVASC) 5 MG tablet Take 5 mg by mouth daily.  aspirin 325 MG tablet Take 325 mg by mouth daily.   Coenzyme Q10 (COQ10 PO) Take by mouth daily.   finasteride (PROSCAR) 5 MG tablet Take 5 mg by mouth as needed.   furosemide (LASIX) 20 MG tablet Take 20 mg by mouth as needed.   isosorbide  mononitrate (IMDUR) 30 MG 24 hr tablet Take 30 mg by mouth daily. losartan-hydrochlorothiazide (HYZAAR) 100-25 MG per tablet Take 1 tablet by mouth daily.   rosuvastatin (CRESTOR) 10 MG tablet Take 10 mg by mouth daily.   Per Dr. Nicholos Johns: 1. isosorbide dinitrate, 60 mg, one half tab daily 2. Finasteride 5 mg daily 3. Aspirin 325 mg daily 4. Coenzyme Q 10 esterase did 5. Multivitamins daily 6. Vicodin 5/500, one or 2 tabs every 6 hours when necessary 7. Indomethacin 50 mg every 8 hours when necessary 8. He Crestor 10 mg, one half tablet daily 9. Allopurinol 300 mg one and a half tablets daily 10.Amlodipine 5 mg daily 11. Losartan 100 mg daily 12. Benadryl when necessary   Allergies:  Allergies  Allergen Reactions  . Zocor (Simvastatin)     History   Social History  . Marital Status: Married- primary caregiver for his wife whom he states is feeble     Spouse Name: N/A    Number of Children: 8  . Years of Education: N/A   Occupational History  . 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 Smoking status: Former Smoker    Quit date: 12/12/1970  . Smokeless tobacco: Not on file  . Alcohol Use: No  . Drug Use: No  . Sexually Active:    Other Topics Concern  . Not on file   Social History Narrative  . 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  He denies recent tobacco use or alcohol abuse.  He moved here 24 years ago from [place]. He stays active around the house and at work.      Family History  Problem Relation Age of Onset   Both parents are deceased    . Hx of a heart attack Brother 47 years old     Physical Exam: Blood pressure 105/58, pulse 57, temperature 98.2 F (36.8 C), temperature source Oral, resp. rate 18, height 5\' 10"  (1.778 m), weight 192 lb (87.091 kg), SpO2 99.00%. General: Well developed, well nourished, elderly African American  male in no acute distress. Head: Normocephalic, atraumatic, sclera non-icteric, no xanthomas, nares are without discharge. He is edentulous and has dentures in place  Neck:No carotid bruits. JVD 6 cm. No thyromegally Lungs: Clear bilaterally to auscultation without wheezes, rales, or rhonchi. Good inspiratory effort Heart: RRR with S1 S2. 2/6 systolic murmur at the apex, no rubs or gallops appreciated. Abdomen: Soft, non-tender, non-distended with normoactive bowel sounds. No hepatomegaly. No rebound/guarding. No obvious abdominal masses. Msk:  Strength and tone appear normal for age. No joint deformities or effusions, no spine or costo-vertebral angle tenderness. Extremities: No clubbing or cyanosis. No edema.  Distal pedal pulses are 2+ and equal bilaterally in the upper extremities. Distal pulses are slightly decreased in both  lower extremities. Neuro: Alert and oriented X 3. Moves all extremities spontaneously. No focal deficits noted. Psych:  Responds to questions appropriately with a normal affect. Skin: No rashes or lesions noted  Review of Systems: He has not had any recent illnesses, fevers or chills. He has no recent cough or cold symptoms. He has not noted any lower extremity edema. He has not been taking his when necessary medications for gout recently. He does have some chronic musculoskeletal aches and pains but these are not any worse. He has not had any reflux symptoms, melena or hematemesis. All other systems reviewed and are otherwise negative except as noted above.  Labs: pending  Lab Results  Component Value Date   WBC 8.0 10/02/2008   HGB 12.9* 08/29/2011   HCT 38.0* 08/29/2011   MCV 83.7 10/02/2008   PLT 184 10/02/2008   No results found for this basename: INR in the last 72 hours  Lab 08/29/11 1602  NA 139  K 4.6  CL 108  CO2 --  BUN 18  CREATININE 1.10  CALCIUM --  PROT --  BILITOT --  ALKPHOS --  ALT --  AST --  GLUCOSE 93   No results found for this  basename: CKTOTAL:4,CKMB:4,TROPONINI:4 in the last 72 hours       Radiology/Studies:  PORTABLE CHEST - 1 VIEW Comparison: 10/12/2007. Findings: Trachea is midline. Heart size normal. Minimal linear atelectasis or scarring at the left lung base. Lungs are otherwise clear. No pleural fluid. IMPRESSION: No acute findings. Last Resulted: 08/29/11 3:36 PM    EKG: Sinus bradycardia, no new ischemic changes compared to 2009.  ASSESSMENT AND PLAN:  1. Chest pain: Mr. Sharol Harness has recent chest pain with typical and atypical symptoms. A point-of-care troponin is ordered as well as regular cardiac enzymes and other labs. We will followup on all of these. We will continue to cycle cardiac enzymes. We will check an echocardiogram. He will be started on heparin and nitroglycerin. He has a known occlusion of the circumflex. Tomorrow, if enzymes are negative, consideration can be given to medical management versus catheterization. If his enzymes are elevated, cardiac catheterization is indicated. We will screen for cardiac risk factors.  Signed, Theodore Demark PA-C  This 78 year old healthy gentleman with known coronary artery disease presenting with chest pain was seen recently by his PCP and given a clean bill of health. In the wake of that he developed irruption-cutaneous which prompted the prescription of medication which tapered which is likely steroids. In the wake of that he developed shortness of breath and some abdominal chest discomfort which has been unrelated to exertion not aggravated by exertion but not problematic enough to keep him from eating. It has been relatively steady for the last couple of days.  Because of the discomfort, he was seen by his PCP and referred to the emergency room. He was called a code STEMI. On arrival, however, he had no pain and he was referred to the emergency room for further evaluation. He remains pain-free. The other major complaint has been dyspnea. He denies  peripheral edema, orthopnea, recent travel. He has had no fevers or cough.  Initial electrocardiograms were notable for ST elevation in the anterior precordium. These were quite distinct we'll echocardiogram sounded 2005. However, electro-cardiogram 2009 demonstrates a similar qualitative although quantitatively less impressive mild ST segment elevation in the anterior precordium  The cause of his dyspnea is not clear. I wonder whether the steroids were associated with some fluid retention. We  will check a d-dimer to exclude pulmonary embolism. His initial point-of-care cardiac markers are negative. We will cycle the night. I would anticipate early discharge in the event that we don't find objective evidence of acute hemodynamic/cardiovascular process..     08/29/2011, 4:15 PM   .

## 2011-08-29 NOTE — Progress Notes (Signed)
Upon arrival to the floor, EKG revealed patient in 2nd degree Type 1 Heart Block.  Patient has no complaints.  Ward Givens, NP notified.  Will continue to monitor. Nolon Nations

## 2011-08-29 NOTE — ED Provider Notes (Signed)
History     CSN: [id number]  Arrival date & time 08/29/11  1432   First MD Initiated Contact with Patient 08/29/11 1439      Chief Complaint  Patient presents with  . Chest Pain    (Consider location/radiation/quality/duration/timing/severity/associated sxs/prior treatment) HPI Patient is a 78 yo male who presented by EMS from his PCP's office as a CODE STEMI.  Patient was taken initially to the cath lab where his CODE was cancelled.  Patient was then brought to room 6.  He had chest pain over the past 2 days that was intermittent and localized to the left upper chest.  He is chest pain free at this time.  He has had no recent cough or fever.  He has associated shortness of breath but no nausea, vomiting, or diaphoresis.  He can not tell me anything that makes this better or worse.  Patient does have a history of CAD and has been seen by Dr. [place] in the past.   Past Medical History  Diagnosis Date  . MI, old   . Mitral insufficiency     CHRONIC  . CHF (congestive heart failure)     WITH MODERATE SYSTOLIC DYSFUNCTION  . Hypertension   . Hyperlipidemia   . 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 Coronary artery disease     WITH REMOTE LATERAL MYOCARDIAL INFARCTION  . CAD (coronary artery disease)     EJECTION FRACTION 44%  . Raynaud phenomenon   . Dizziness     Past Surgical History  Procedure Date  . Cardiac catheterization 2003    SHOWED OCCLUSION OF THE LEFT CIRCUMFLEX CORONARY ARTERY  . Inguinal hernia repair   . Appendectomy     Family History  Problem Relation Age of Onset  . Heart attack Brother     History  Substance Use Topics  . Smoking status: Former Smoker    Quit date: 12/12/1970  . Smokeless tobacco: Not on file  . Alcohol Use: No      Review of Systems  Constitutional: Negative.   HENT: Negative.   Eyes: Negative.   Respiratory: Positive for shortness of breath.   Cardiovascular: Positive for chest pain.  Gastrointestinal: Negative.     Genitourinary: Negative.   Musculoskeletal: Negative.   Skin: Negative.   Neurological: Negative.   Hematological: Negative.   Psychiatric/Behavioral: Negative.   All other systems reviewed and are negative.    Allergies  Zocor  Home Medications   Current Outpatient Rx  Name Route Sig Dispense Refill  . AMLODIPINE BESYLATE 5 MG PO TABS Oral Take 1 tablet (5 mg total) by mouth daily. 90 tablet 3  . ASPIRIN 325 MG PO TABS Oral Take 325 mg by mouth daily.      . COQ10 PO Oral Take by mouth daily.      Marland Kitchen FINASTERIDE 5 MG PO TABS Oral Take 5 mg by mouth as needed.      . FUROSEMIDE 20 MG PO TABS Oral Take 20 mg by mouth as needed.      . INDOMETHACIN 50 MG PO CAPS  Ad lib.    . ISOSORBIDE MONONITRATE ER 30 MG PO TB24 Oral Take 30 mg by mouth daily.      Marland Kitchen LOSARTAN POTASSIUM-HCTZ 100-25 MG PO TABS  TAKE 1 TABLET BY MOUTH ONCE A DAY 90 tablet 3  . ROSUVASTATIN CALCIUM 10 MG PO TABS Oral Take 1 tablet (10 mg total) by mouth daily. 30  tablet 5    NEEDS REFILLS    There were no vitals taken for this visit.  Physical Exam  Nursing note and vitals reviewed. Constitutional: He is oriented to person, place, and time. He appears well-developed and well-nourished. No distress.  HENT:  Head: Normocephalic and atraumatic.  Eyes: Conjunctivae and EOM are normal. Pupils are equal, round, and reactive to light.  Cardiovascular: Intact distal pulses.  An irregular rhythm present. Bradycardia present.   Pulmonary/Chest: Effort normal and breath sounds normal. No respiratory distress. He has no wheezes. He has no rales.  Abdominal: Soft. Bowel sounds are normal. He exhibits no distension. There is no tenderness. There is no rebound and no guarding.  Musculoskeletal: Normal range of motion. He exhibits no edema.  Neurological: He is alert and oriented to person, place, and time. No cranial nerve deficit. He exhibits normal muscle tone. Coordination normal.  Skin: Skin is warm and dry. No rash  noted.  Psychiatric: He has a normal mood and affect.    ED Course  Procedures (including critical care time)   Date: 08/29/2011  Rate: 58  Rhythm: atrial fibrillation  QRS Axis: normal  Intervals: normal  ST/T Wave abnormalities: nonspecific T wave changes  Conduction Disutrbances:none  Narrative Interpretation:   Old EKG Reviewed: unchanged  Labs Reviewed  CBC - Abnormal; Notable for the following:    WBC 15.1 (*)    Hemoglobin 12.7 (*)    HCT 37.5 (*)    All other components within normal limits  PRO B NATRIURETIC PEPTIDE - Abnormal; Notable for the following:    Pro B Natriuretic peptide (BNP) 1670.0 (*)    All other components within normal limits  DIFFERENTIAL - Abnormal; Notable for the following:    Neutro Abs 10.5 (*)    Monocytes Absolute 1.7 (*)    All other components within normal limits  COMPREHENSIVE METABOLIC PANEL - Abnormal; Notable for the following:    Albumin 3.1 (*)    GFR calc non Af Amer 60 (*)    GFR calc Af Amer 69 (*)    All other components within normal limits  POCT I-STAT, CHEM 8 - Abnormal; Notable for the following:    Hemoglobin 12.9 (*)    HCT 38.0 (*)    All other components within normal limits  D-DIMER, QUANTITATIVE - Abnormal; Notable for the following:    D-Dimer, Quant 0.92 (*)    All other components within normal limits  CARDIAC PANEL(CRET KIN+CKTOT+MB+TROPI)  PROTIME-INR  APTT  LIPID PANEL  POCT I-STAT TROPONIN I  I-STAT TROPONIN I  I-STAT, CHEM 8  CARDIAC PANEL(CRET KIN+CKTOT+MB+TROPI)  CARDIAC PANEL(CRET KIN+CKTOT+MB+TROPI)  HEMOGLOBIN A1C  I-STAT TROPONIN I  D-DIMER, QUANTITATIVE  HEPARIN LEVEL (UNFRACTIONATED)  CBC   Dg Chest Portable 1 View  08/29/2011  *RADIOLOGY REPORT*  Clinical Data: Chest pain.  PORTABLE CHEST - 1 VIEW  Comparison: 10/12/2007.  Findings: Trachea is midline.  Heart size normal.  Minimal linear atelectasis or scarring at the left lung base.  Lungs are otherwise clear.  No pleural fluid.   IMPRESSION: No acute findings.  Original Report Authenticated By: Reyes Ivan, M.D.     1. Chest pain       MDM  Patient was pain free and stable upon arrival.  Cardiology was already at bedside as they had brought the patient down from cath lab after canceling the code STEMI.  Patient had already received ASA and was pain free.  Lab workup was ordered.  CXR  was normal.  Lab work-up was remarkable for elevated BNP.  CXR was clear and patient had mild leukocytosis. He remained stable and cardiology admitted for further management.       Cyndra Numbers, MD 08/29/11 2129

## 2011-08-29 NOTE — ED Notes (Signed)
[id number] Ready 

## 2011-08-29 NOTE — ED Notes (Signed)
Pt to ED via EMS from PCP. C/O chest pain x3 days to upper left chest nonradiating. Pt went to cath lab upon arrival. STEMI cancelled per MD; pt brought into ED room 6. MD in room assessing pt now. Dr.Jordan is pts cardiologist.

## 2011-08-29 NOTE — Progress Notes (Signed)
Called to evaluate EKG due to concerns for possible complete heart block.  EKG from 2137 demonstrates sinus rhythm with 2nd degree heart block (Type 1).  This is unchanged.  Patient notes occasional pleuritic pain on deep inspiration.  Troponins are negative.  Continue to monitor and cycle CE.   Gwendalyn Ege, Cardiology

## 2011-08-29 NOTE — Progress Notes (Signed)
ANTICOAGULATION CONSULT NOTE - Initial Consult  Pharmacy Consult for Heparin Indication: chest pain/ACS  Allergies  Allergen Reactions  . Zocor (Simvastatin)     Patient Measurements: Height: 5\' 10"  (177.8 cm) Weight: 192 lb (87.091 kg) IBW/kg (Calculated) : 73    Vital Signs: Temp: 98.2 F (36.8 C) (01/24 1439) Temp src: Oral (01/24 1439) BP: 134/77 mmHg (01/24 1703) Pulse Rate: 61  (01/24 1703)  Labs:  Basename 08/29/11 1621 08/29/11 1602 08/29/11 1533  HGB -- 12.9* 12.7*  HCT -- 38.0* 37.5*  PLT -- -- 195  APTT 31 -- --  LABPROT 14.2 -- --  INR 1.08 -- --  HEPARINUNFRC -- -- --  CREATININE -- 1.10 --  CKTOTAL -- -- --  CKMB -- -- --  TROPONINI -- -- --   Estimated Creatinine Clearance: 46.1 ml/min (by C-G formula based on Cr of 1.1).  Medical History: Past Medical History  Diagnosis Date  . MI, old   . Mitral insufficiency     CHRONIC  . CHF (congestive heart failure)     WITH MODERATE SYSTOLIC DYSFUNCTION  . Hypertension   . Hyperlipidemia   . 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 Coronary artery disease     WITH REMOTE LATERAL MYOCARDIAL INFARCTION  . CAD (coronary artery disease)     EJECTION FRACTION 44%  . Raynaud phenomenon   . Dizziness     Medications:  Scheduled:    . allopurinol  300 mg Oral Daily  . amLODipine  5 mg Oral Daily  . aspirin  325 mg Oral Daily  . CoQ10  1 capsule Oral Daily  . finasteride  5 mg Oral Daily  . furosemide  20 mg Oral Daily  . isosorbide mononitrate  30 mg Oral Daily  . rosuvastatin  10 mg Oral QHS    Assessment: 78 year old admitted with chest pain.  Cycling enzymes to determine if cath vs medical management  Goal of Therapy:  Heparin level 0.3-0.7 units/ml   Plan:  1) Heparin 4000 units iv bolus x 1 2) Heparin drip at 1100 units / hr 3) Heparin level 8 hours after heparin starts 4) Daily heparin level / CBC  Thank you.  Elwin Sleight 08/29/2011,5:10 PM

## 2011-08-30 ENCOUNTER — Encounter (HOSPITAL_COMMUNITY): Payer: Self-pay | Admitting: Radiology

## 2011-08-30 ENCOUNTER — Inpatient Hospital Stay (HOSPITAL_COMMUNITY): Payer: Medicare Other

## 2011-08-30 ENCOUNTER — Other Ambulatory Visit: Payer: Self-pay

## 2011-08-30 DIAGNOSIS — I2510 Atherosclerotic heart disease of native coronary artery without angina pectoris: Secondary | ICD-10-CM | POA: Diagnosis not present

## 2011-08-30 DIAGNOSIS — R079 Chest pain, unspecified: Secondary | ICD-10-CM | POA: Diagnosis not present

## 2011-08-30 DIAGNOSIS — I509 Heart failure, unspecified: Secondary | ICD-10-CM | POA: Diagnosis not present

## 2011-08-30 DIAGNOSIS — I10 Essential (primary) hypertension: Secondary | ICD-10-CM | POA: Diagnosis not present

## 2011-08-30 DIAGNOSIS — R0602 Shortness of breath: Secondary | ICD-10-CM | POA: Diagnosis not present

## 2011-08-30 DIAGNOSIS — R799 Abnormal finding of blood chemistry, unspecified: Secondary | ICD-10-CM | POA: Diagnosis not present

## 2011-08-30 DIAGNOSIS — I059 Rheumatic mitral valve disease, unspecified: Secondary | ICD-10-CM | POA: Diagnosis not present

## 2011-08-30 DIAGNOSIS — I252 Old myocardial infarction: Secondary | ICD-10-CM | POA: Diagnosis not present

## 2011-08-30 DIAGNOSIS — R0789 Other chest pain: Secondary | ICD-10-CM | POA: Diagnosis not present

## 2011-08-30 LAB — CBC
HCT: 38.8 % — ABNORMAL LOW (ref 39.0–52.0)
Hemoglobin: 13.3 g/dL (ref 13.0–17.0)
MCHC: 34.3 g/dL (ref 30.0–36.0)
RBC: 4.66 MIL/uL (ref 4.22–5.81)

## 2011-08-30 LAB — HEPARIN LEVEL (UNFRACTIONATED): Heparin Unfractionated: 0.23 IU/mL — ABNORMAL LOW (ref 0.30–0.70)

## 2011-08-30 LAB — CARDIAC PANEL(CRET KIN+CKTOT+MB+TROPI)
CK, MB: 2 ng/mL (ref 0.3–4.0)
Relative Index: INVALID (ref 0.0–2.5)
Total CK: 39 U/L (ref 7–232)
Troponin I: <0.3 ng/mL (ref <0.30)

## 2011-08-30 MED ORDER — NITROGLYCERIN 0.4 MG SL SUBL: 0.4000 mg | SUBLINGUAL_TABLET | SUBLINGUAL | Status: DC | PRN

## 2011-08-30 MED ORDER — IOHEXOL 300 MG/ML  SOLN
100.0000 mL | Freq: Once | INTRAMUSCULAR | Status: AC | PRN
  Administered 2011-08-30: 100 mL via INTRAVENOUS

## 2011-08-30 MED ORDER — HEPARIN BOLUS VIA INFUSION
1500.0000 [IU] | Freq: Once | INTRAVENOUS | Status: AC
  Administered 2011-08-30: 1500 [IU] via INTRAVENOUS
  Filled 2011-08-30: qty 1500

## 2011-08-30 MED ORDER — FUROSEMIDE 20 MG PO TABS: 20.0000 mg | ORAL_TABLET | Freq: Every day | ORAL | Status: DC

## 2011-08-30 NOTE — Progress Notes (Signed)
ANTICOAGULATION CONSULT NOTE - Initial Consult  Pharmacy Consult for Heparin Indication: chest pain/ACS  Allergies  Allergen Reactions  . Zocor (Simvastatin)     Patient Measurements: Height: 5' 10.5" (179.1 cm) Weight: 192 lb 1.6 oz (87.136 kg) IBW/kg (Calculated) : 74.15    Vital Signs: Temp: 98.2 F (36.8 C) (01/24 2100) Temp src: Oral (01/24 2100) BP: 147/87 mmHg (01/24 2100) Pulse Rate: 63  (01/24 2100)  Labs:  Basename 08/30/11 0219 08/30/11 0005 08/29/11 1630 08/29/11 1621 08/29/11 1602 08/29/11 1533  HGB 13.3 -- -- -- 12.9* --  HCT 38.8* -- -- -- 38.0* 37.5*  PLT 199 -- -- -- -- 195  APTT -- -- -- 31 -- --  LABPROT -- -- -- 14.2 -- --  INR -- -- -- 1.08 -- --  HEPARINUNFRC 0.23* -- -- -- -- --  CREATININE -- -- -- 1.06 1.10 --  CKTOTAL -- 39 46 -- -- --  CKMB -- 2.0 2.0 -- -- --  TROPONINI -- <0.30 <0.30 -- -- --   Estimated Creatinine Clearance: 48.6 ml/min (by C-G formula based on Cr of 1.06).  Assessment: 78 year old admitted with chest pain.    Goal of Therapy:  Heparin level 0.3-0.7 units/ml   Plan:  1) Heparin 1500 units iv bolus x 1 2) Increase Heparin 1300 units / hr 3) Heparin level in 8 hours     Edras Wilford, Gary Fleet 08/30/2011,3:39 AM

## 2011-08-30 NOTE — Discharge Summary (Signed)
CARDIOLOGY DISCHARGE SUMMARY   Patient ID: Craig [name] MRN: [id number] DOB/AGE: 04/12/1919 78 y.o.  Admit date: 08/29/2011 Discharge date: 08/30/2011  Primary Discharge Diagnosis:  Chest pain Secondary Discharge Diagnosis:  Patient Active Problem List  Diagnoses  . Coronary artery disease  . MI, old  . Mitral insufficiency  . CHF (congestive heart failure)  . Hypertension  . Hyperlipidemia  . AV block, 2nd degree  . CAD (coronary artery disease)  . Raynaud phenomenon  . Dizziness  . SOB (shortness of breath) on exertion   Procedures: CT angiogram of the chest  Hospital Course: Craig [name] is a 78 year old male with a history of coronary artery disease. He had chest pain and shortness of breath that started several days before the day of admission. He saw his primary care physician and was sent to the emergency room where he was admitted for further evaluation and treatment.  His cardiac enzymes were negative for MI. His white count was elevated at about 15,000 on admission but dropped to 12.5 thousand by discharge. He had no cough or upper respiratory infection symptoms. He had no dysuria. He was afebrile. His BNP was mildly elevated but he had O2 saturations that were within normal limits on room air. His chest x-ray did not show any acute disease.  On 08/30/2011, Craig [name] was seen by Dr. Graciela Husbands. He was feeling better and ambulating without chest pain or shortness of breath. Dr. Graciela Husbands felt that he would benefit from an increased Lasix dose for a few days, but considered him stable for discharge, to get a stress test and then follow up as an outpatient.  Labs:   Lab Results  Component Value Date   WBC 12.5* 08/30/2011   HGB 13.3 08/30/2011   HCT 38.8* 08/30/2011   MCV 83.3 08/30/2011   PLT 199 08/30/2011    Lab 08/29/11 1621  NA 138  K 5.0  CL 105  CO2 26  BUN 17  CREATININE 1.06  CALCIUM 9.0  PROT 6.8  BILITOT 0.5  ALKPHOS 66  ALT 25  AST 15  GLUCOSE 87     Basename 08/30/11 0005 08/29/11 1630  CKTOTAL 39 46  CKMB 2.0 2.0  CKMBINDEX -- --  TROPONINI <0.30 <0.30   Lipid Panel     Component Value Date/Time   CHOL 131 08/29/2011 1630   TRIG 75 08/29/2011 1630   HDL 46 08/29/2011 1630   CHOLHDL 2.8 08/29/2011 1630   VLDL 15 08/29/2011 1630   LDLCALC 70 08/29/2011 1630    Pro B Natriuretic peptide (BNP)  Date/Time Value Range Status  08/29/2011  3:33 PM 1670.0* 0-450 (pg/mL) Final    Basename 08/29/11 1621  INR 1.08       Radiology:  PORTABLE CHEST - 1 VIEW Comparison: 10/12/2007. Findings: Trachea is midline. Heart size normal. Minimal linear atelectasis or scarring at the left lung base. Lungs are otherwise clear. No pleural fluid. IMPRESSION: No acute findings. Original Report Authenticated By: Reyes Ivan, M.D. Last Resulted: 08/29/11 3:36 PM   CT Angiogram of the chest IMPRESSION: No CT evidence for acute pulmonary embolus.  Probable areas of air trapping in the upper lobes.  Heterogeneous mineralization in the visualized bony structures. This could be related to osteopenia, but an infiltrative marrow process is not excluded.  Original Report Authenticated By: ERIC A. MANSELL, M.D. Last Resulted: 08/30/11 12:27 PM   EKG:29-Aug-2011 14:39:08  SINUS OR ECTOPIC ATRIAL RHYTHM ~ P axis (-45,135) FIRST DEGREE AV BLOCK ~  PR >210, V-rate 50- 90 RSR' IN V1 OR V2, RIGHT VCD OR RVH ~ QRS area positive & R' V1/V2 Abnormal ECG No significant change since last tracing Vent. rate 61 BPM PR interval 441 ms QRS duration 84 ms QT/QTc 428/431 ms P-R-T axes -88 11 72  FOLLOW UP PLANS AND APPOINTMENTS Discharge Orders    Future Appointments: Provider: Department: Dept Phone: Center:   09/10/2011 11:30 AM [facility]  None   09/13/2011 9:30 AM Rosalio Macadamia, NP [facility] [phone] None     Future Orders Please Complete By Expires   Diet - low sodium heart healthy      Increase  activity slowly        Medication List  As of 08/30/2011  3:25 PM   TAKE these medications         allopurinol 300 MG tablet   Commonly known as: ZYLOPRIM   Take 300 mg by mouth daily.      amLODipine 5 MG tablet   Commonly known as: NORVASC   Take 5 mg by mouth daily.      aspirin 325 MG tablet   Take 325 mg by mouth daily.      COQ10 PO   Take 1 capsule by mouth daily.      finasteride 5 MG tablet   Commonly known as: PROSCAR   Take 5 mg by mouth as needed.      furosemide 20 MG tablet   Commonly known as: LASIX   Take 1 tablet (20 mg total) by mouth daily.      isosorbide mononitrate 30 MG 24 hr tablet   Commonly known as: IMDUR   Take 30 mg by mouth daily.      nitroGLYCERIN 0.4 MG SL tablet   Commonly known as: NITROSTAT   Place 1 tablet (0.4 mg total) under the tongue every 5 (five) minutes as needed for chest pain.      rosuvastatin 10 MG tablet   Commonly known as: CRESTOR   Take 10 mg by mouth at bedtime.           Follow-up Information    Follow up with Norma Fredrickson, NP. (For Dr Swaziland on February 8th at 9:30am)    Contact information:   [address] [place] [address] [phone]          BRING ALL MEDICATIONS WITH YOU TO FOLLOW UP APPOINTMENTS  Time spent with patient to include physician time: 40 min Signed: Theodore Demark 08/30/2011, 3:25 PM Co-Sign MD

## 2011-08-30 NOTE — Progress Notes (Signed)
ANTICOAGULATION CONSULT NOTE -Follow Up  Pharmacy Consult for Heparin Indication: chest pain/ACS  Allergies  Allergen Reactions  . Zocor (Simvastatin)     Patient Measurements: Height: 5' 10.5" (179.1 cm) Weight: 192 lb 11.2 oz (87.408 kg) IBW/kg (Calculated) : 74.15   Assessment: 78 year old admitted with chest pain.    Anticoagulation: ACS: heparin at 1300 units/hr CBC stable, no bleeding noted. Follow up CT result and plan for duration of anticoagulation therapy. Cardiovascular: CAD, history of MI, hypertension, hyperlipidemia, CHF (EF 44%): aspirin, IMDUR, crestor, amlodipine, lasix: BP at goal. Endocrinology: gout, raynaud's: allopurinol, denies pain PTA Medication Issues: All home medications resumed Best Practices: DVT Prophylaxsis: IV heparin  Goal of Therapy:  Heparin level 0.3-0.7 units/ml   Plan:  1) Heparin level at goal, Continue heparin at 1300 Units/hr 2) Recheck heparin level in the am.  Vital Signs: Temp: 97.7 F (36.5 C) (01/25 0500) Temp src: Oral (01/25 0500) BP: 123/78 mmHg (01/25 0936) Pulse Rate: 57  (01/25 0500)  Labs:  Basename 08/30/11 1235 08/30/11 0219 08/30/11 0005 08/29/11 1630 08/29/11 1621 08/29/11 1602 08/29/11 1533  HGB -- 13.3 -- -- -- 12.9* --  HCT -- 38.8* -- -- -- 38.0* 37.5*  PLT -- 199 -- -- -- -- 195  APTT -- -- -- -- 31 -- --  LABPROT -- -- -- -- 14.2 -- --  INR -- -- -- -- 1.08 -- --  HEPARINUNFRC 0.57 0.23* -- -- -- -- --  CREATININE -- -- -- -- 1.06 1.10 --  CKTOTAL -- -- 39 46 -- -- --  CKMB -- -- 2.0 2.0 -- -- --  TROPONINI -- -- <0.30 <0.30 -- -- --   Estimated Creatinine Clearance: 48.6 ml/min (by C-G formula based on Cr of 1.06).   Craig [name] Christine Virginia Crews 08/30/2011,1:17 PM

## 2011-08-30 NOTE — Progress Notes (Signed)
D/c orders received; iv removed with gauze on; pt remains in stable condition; pt meds and instructions reviewed and given to pt; answered pts questions, pt d/c home

## 2011-08-30 NOTE — Progress Notes (Signed)
Clinical Social Worker received referral for advance directive request. CSW met with patient and provided the advance directive packet and a MOST form to be filled out with his physician. CSW answered patient's questions and explained the documents in the packet. CSW informed patient that hospital can notarize form during admission and places outside the hospital that can notarize forms as well. CSW will sign off as social work intervention is no longer needed. Please consult [place] again, should new needs arise.   Rozetta Nunnery MSW, [employer] [phone]

## 2011-08-30 NOTE — Progress Notes (Signed)
Patient Name: ARSHAWN VALDEZ Date of Encounter: 08/30/2011  Active Problems:  Coronary artery disease  Hypertension  SOB (shortness of breath) on exertion    SUBJECTIVE:Had 1 episode of chest pain/SOB overnight, started at rest and resolved without intervention except O2. Rested well overnight, no other problems.  OBJECTIVE Filed Vitals:   08/29/11 1739 08/29/11 1756 08/29/11 2100 08/30/11 0500  BP:  141/65 147/87 134/81  Pulse:  60 63 57  Temp:  97.4 F (36.3 C) 98.2 F (36.8 C) 97.7 F (36.5 C)  TempSrc:  Oral Oral Oral  Resp:  16 18 18   Height: 5' 10.5" (1.791 m)     Weight: 192 lb 1.6 oz (87.136 kg)   192 lb 11.2 oz (87.408 kg)  SpO2:  97% 97% 100%    Intake/Output Summary (Last 24 hours) at 08/30/11 0745 Last data filed at 08/30/11 0500  Gross per 24 hour  Intake      0 ml  Output   1300 ml  Net  -1300 ml   Weight change:  Wt Readings from Last 3 Encounters:  08/30/11 192 lb 11.2 oz (87.408 kg)  08/30/11 192 lb 11.2 oz (87.408 kg)  01/04/11 192 lb (87.091 kg)     PHYSICAL EXAM  General: Well developed, well nourished, in no acute distress. Head: Normocephalic, atraumatic.  Neck: Supple without bruits, JVD not elevated. Lungs:  Resp regular and unlabored, CTA. Heart: RRR, S1, S2, no S3, S4, 2/6 murmur. Abdomen: Soft, non-tender, non-distended, BS + x 4.  Extremities: No clubbing, cyanosis, no edema.  Neuro: Alert and oriented X 3. Moves all extremities spontaneously. Psych: Normal affect.  LABS:  CBC: Basename 08/30/11 0219 08/29/11 1621 08/29/11 1602 08/29/11 1533  WBC 12.5* -- -- 15.1*  NEUTROABS -- 10.5* -- --  HGB 13.3 -- 12.9* --  HCT 38.8* -- 38.0* --  MCV 83.3 -- -- 83.3  PLT 199 -- -- 195   INR: Basename 08/29/11 1621  INR 1.08   Basic Metabolic Panel: Basename 08/29/11 1621 08/29/11 1602  NA 138 139  K 5.0 4.6  CL 105 108  CO2 26 --  GLUCOSE 87 93  BUN 17 18  CREATININE 1.06 1.10  CALCIUM 9.0 --  MG -- --  PHOS -- --    Liver Function Tests: Basename 08/29/11 1621  AST 15  ALT 25  ALKPHOS 66  BILITOT 0.5  PROT 6.8  ALBUMIN 3.1*   No results found for this basename: LIPASE:2,AMYLASE:2 in the last 72 hours Cardiac Enzymes: Basename 08/30/11 0005 08/29/11 1630  CKTOTAL 39 46  CKMB 2.0 2.0  CKMBINDEX -- --  TROPONINI <0.30 <0.30   BNP: Pro B Natriuretic peptide (BNP)  Date/Time Value Range Status  08/29/2011  3:33 PM 1670.0* 0-450 (pg/mL) Final   Dimer: Basename 08/29/11 1621  DDIMER 0.92*   Hemoglobin A1C: Basename 08/29/11 1621  HGBA1C 6.5*   Fasting Lipid Panel: Basename 08/29/11 1630  CHOL 131  HDL 46  LDLCALC 70  TRIG 75  CHOLHDL 2.8  LDLDIRECT --   TELE:  SR      Radiology/Studies:  Dg Chest Portable 1 View 08/29/2011  *RADIOLOGY REPORT*  Clinical Data: Chest pain.  PORTABLE CHEST - 1 VIEW  Comparison: 10/12/2007.  Findings: Trachea is midline.  Heart size normal.  Minimal linear atelectasis or scarring at the left lung base.  Lungs are otherwise clear.  No pleural fluid.  IMPRESSION: No acute findings.  Original Report Authenticated By: Reyes Ivan, M.D.  Current Medications:     . allopurinol  300 mg Oral Daily  . amLODipine  5 mg Oral Daily  . aspirin  325 mg Oral Daily  . finasteride  5 mg Oral Daily  . furosemide  20 mg Oral Daily  . heparin  1,500 Units Intravenous Once  . heparin  4,000 Units Intravenous Once  . isosorbide mononitrate  30 mg Oral Daily  . rosuvastatin  10 mg Oral QHS  . sodium chloride  3 mL Intravenous Q12H  . DISCONTD: CoQ10  1 capsule Oral Daily    ASSESSMENT AND PLAN: 1. Chest pain - enzymes negative for MI. D-dimer slightly elevated - do CTA chest. Pt is on heparin.  2. Hx CAD - No clear ischemic symptoms  3. HTN - SBP 120s-140s on current Rx  4. SOB - sats OK on room air, CXR without significant abnormality, CTA chest pending.   Signed, Theodore Demark , PA-C 7:45 AM 08/30/2011  Addendum: CTA Chest  08/30/2011 IMPRESSION: No CT evidence for acute pulmonary embolus.  Probable areas of air trapping in the upper lobes.  Heterogeneous mineralization in the visualized bony structures. This could be related to osteopenia, but an infiltrative marrow process is not excluded.  Syndrome of shortness of breath and chest pain has resolved without clear explananaion  We will discharge to home on  LASIX BID for 5 days And out ptmyoview  F/u Dr [place] Dorina Hoyer weeks

## 2011-09-02 NOTE — Progress Notes (Signed)
UR Completed. POST DISCHARGE Simmons, Undra Harriman F [phone]   

## 2011-09-04 ENCOUNTER — Telehealth: Payer: Self-pay | Admitting: Nurse Practitioner

## 2011-09-04 NOTE — Telephone Encounter (Signed)
Patient called,stating he went to ER and was told to take Lasix 20 mg twice a day for 5 days,wanting to know how he should take now.Advised to take like previously 20 mg as needed.

## 2011-09-04 NOTE — Telephone Encounter (Signed)
New problem Pt wants to know if he should continue to take furosemide please call

## 2011-09-09 ENCOUNTER — Telehealth: Payer: Self-pay | Admitting: Cardiology

## 2011-09-09 ENCOUNTER — Other Ambulatory Visit (HOSPITAL_COMMUNITY): Payer: Self-pay | Admitting: Cardiology

## 2011-09-09 DIAGNOSIS — R079 Chest pain, unspecified: Secondary | ICD-10-CM

## 2011-09-09 NOTE — Telephone Encounter (Signed)
New Problem   Patient returning nurses call, he can be reached at hm#

## 2011-09-10 ENCOUNTER — Ambulatory Visit (HOSPITAL_COMMUNITY): Payer: Medicare Other | Attending: Cardiology | Admitting: Radiology

## 2011-09-10 VITALS — BP 132/84 | Ht 70.0 in | Wt 187.0 lb

## 2011-09-10 DIAGNOSIS — I10 Essential (primary) hypertension: Secondary | ICD-10-CM | POA: Insufficient documentation

## 2011-09-10 DIAGNOSIS — R42 Dizziness and giddiness: Secondary | ICD-10-CM | POA: Insufficient documentation

## 2011-09-10 DIAGNOSIS — R079 Chest pain, unspecified: Secondary | ICD-10-CM | POA: Diagnosis not present

## 2011-09-10 DIAGNOSIS — R0989 Other specified symptoms and signs involving the circulatory and respiratory systems: Secondary | ICD-10-CM | POA: Insufficient documentation

## 2011-09-10 DIAGNOSIS — E785 Hyperlipidemia, unspecified: Secondary | ICD-10-CM | POA: Diagnosis not present

## 2011-09-10 DIAGNOSIS — R0609 Other forms of dyspnea: Secondary | ICD-10-CM | POA: Insufficient documentation

## 2011-09-10 DIAGNOSIS — I441 Atrioventricular block, second degree: Secondary | ICD-10-CM | POA: Insufficient documentation

## 2011-09-10 DIAGNOSIS — I2510 Atherosclerotic heart disease of native coronary artery without angina pectoris: Secondary | ICD-10-CM | POA: Diagnosis not present

## 2011-09-10 DIAGNOSIS — I252 Old myocardial infarction: Secondary | ICD-10-CM | POA: Insufficient documentation

## 2011-09-10 DIAGNOSIS — Z87891 Personal history of nicotine dependence: Secondary | ICD-10-CM | POA: Diagnosis not present

## 2011-09-10 MED ORDER — TECHNETIUM TC 99M TETROFOSMIN IV KIT
33.0000 | PACK | Freq: Once | INTRAVENOUS | Status: AC | PRN
  Administered 2011-09-10: 33 via INTRAVENOUS

## 2011-09-10 MED ORDER — REGADENOSON 0.4 MG/5ML IV SOLN
0.4000 mg | Freq: Once | INTRAVENOUS | Status: AC
  Administered 2011-09-10: 0.4 mg via INTRAVENOUS

## 2011-09-10 MED ORDER — TECHNETIUM TC 99M TETROFOSMIN IV KIT
11.0000 | PACK | Freq: Once | INTRAVENOUS | Status: AC | PRN
  Administered 2011-09-10: 11 via INTRAVENOUS

## 2011-09-10 NOTE — Progress Notes (Signed)
[facility] SITE 3 NUCLEAR MED [address] [place] [address] [phone]  Cardiology Nuclear Med Study  Craig [name] is a 78 y.o. male [id number] 10-02-1918   Nuclear Med Background Indication for Stress Test:  Evaluation for Ischemia and 08/29/11 [facility] with Chest Pain, (-) Enzymes History:  Myocardial Infarction; H/O 2nd Degree AVB;  '03 Cath:Occluded CFX with collaterals, N/O CAD LAD/RCA, medical tx; 2/12 MPS:No ischemia, EF=40% Cardiac Risk Factors: History of Smoking, Hypertension and Lipids  Symptoms:  Chest Pain (last episode of chest discomfort:none since discharge), Dizziness and DOE   Nuclear Pre-Procedure Caffeine/Decaff Intake:  None NPO After: 7:00pm   Lungs:  Clear.  O2 SAT 98% on RA. IV 0.9% NS with Angio Cath:  20g  IV Site: R Hand  IV Started by:  Cathlyn Parsons, RN  Chest Size (in):  44 Cup Size: n/a  Height: 5\' 10"  (1.778 m)  Weight:  187 lb (84.823 kg)  BMI:  Body mass index is 26.83 kg/(m^2). Tech Comments:  n/a    Nuclear Med Study 1 or 2 day study: 1 day  Stress Test Type:  Treadmill/Lexiscan  Reading MD: Willa Rough, MD  Order Authorizing Provider:  Peter Swaziland, MD  Resting Radionuclide: Technetium 20m Tetrofosmin  Resting Radionuclide Dose: 11.0 mCi   Stress Radionuclide:  Technetium 110m Tetrofosmin  Stress Radionuclide Dose: 33.0 mCi           Stress Protocol Rest HR: 53 Stress HR: 89  Rest BP: Sitting 132/84  Standing 134/86 Stress BP: 158/88  Exercise Time (min): 2:00 METS: n/a   Predicted Max HR: 130 bpm % Max HR: 68.46 bpm Rate Pressure Product: 42595   Dose of Adenosine (mg):  n/a Dose of Lexiscan: 0.4 mg  Dose of Atropine (mg): n/a Dose of Dobutamine: n/a mcg/kg/min (at max HR)  Stress Test Technologist: Smiley Houseman, CMA-N  Nuclear Technologist:  Domenic Polite, CNMT     Rest Procedure:  Myocardial perfusion imaging was performed at rest 45 minutes following the intravenous administration  of Technetium 25m Tetrofosmin.  Rest ECG: ? 3rd degree AVB vs 2nd degree AVB, occasional PVC's.  (has a h/o 2nd AVB)   Stress Procedure:  The patient received IV Lexiscan 0.4 mg over 15-seconds with concurrent low level exercise and then Technetium 39m Tetrofosmin was injected at 30-seconds while the patient continued walking one more minute.  There were episodes of 2nd degree AVB with Lexiscan.  Quantitative spect images were obtained after a 45-minute delay.  Stress ECG: No significant change from baseline ECG  QPS Raw Data Images:  Patient motion noted; appropriate software correction applied. Stress Images:  There is severe decreased uptake in the entire inferolateral wall and lateral wall. Rest Images:  Images are the same as stress Subtraction (SDS):  There may be very slight peri-infarct ischemia Transient Ischemic Dilatation (Normal <1.22):  1.05 Lung/Heart Ratio (Normal <0.45):  0.27  Quantitative Gated Spect Images QGS EDV:  167 ml QGS ESV:  101 ml QGS cine images:  Hypokinesis of the inferolateral and lateral walls QGS EF: 40%  Impression Exercise Capacity:  Lexiscan with low level exercise. BP Response:  Normal blood pressure response. Clinical Symptoms:  No chest pain. ECG Impression:  No significant ST segment change suggestive of ischemia. Comparison with Prior Nuclear Study: See discussion below  Overall Impression:  There is a large dense scar that affects the entire inferolateral wall and lateral wall. There may be very slight peri-infarct ischemia. The images  are very similar to the prior study of February, 2012.  Willa Rough, MD

## 2011-09-13 ENCOUNTER — Encounter: Payer: Self-pay | Admitting: Nurse Practitioner

## 2011-09-13 ENCOUNTER — Ambulatory Visit (INDEPENDENT_AMBULATORY_CARE_PROVIDER_SITE_OTHER): Payer: Medicare Other | Admitting: Nurse Practitioner

## 2011-09-13 VITALS — BP 152/88 | HR 66 | Ht 70.5 in | Wt 191.0 lb

## 2011-09-13 DIAGNOSIS — I2510 Atherosclerotic heart disease of native coronary artery without angina pectoris: Secondary | ICD-10-CM | POA: Diagnosis not present

## 2011-09-13 DIAGNOSIS — E785 Hyperlipidemia, unspecified: Secondary | ICD-10-CM | POA: Diagnosis not present

## 2011-09-13 NOTE — Assessment & Plan Note (Addendum)
He is doing well. No recurrence of chest pain. His Myoview is reviewed with him. Will keep him on his current regimen. Sodium restriction is strongly encouraged. Will continue with medical management. We will see him back in 3 months. Patient is agreeable to this plan and will call if any problems develop in the interim.

## 2011-09-13 NOTE — Progress Notes (Signed)
Craig [name] Date of Birth: Dec 13, 1918 Medical Record #[id number]  History of Present Illness: Craig [name] is seen today for a post hospital visit. He is seen for Dr. Sw[place]il[place]. He has had a recent admission for chest pain and shortness of breath. His enzymes were negative. His BNP was elevated. He was discharged and had an outpatient Myoview. This is felt to be unchanged. Has known total LCX occlusion and has known LV dysfunction. EF is 40%.   He comes in today. Says he is feeling better. No further chest pain. Says he is taking his medicines but is not really able to tell me what he takes. Sounds like he doesn't really take his Lasix very much. Does not really watch his salt. [facility] is staying [facility]   Current Outpatient Prescriptions on File Prior to Visit  Medication Sig Dispense Refill  . allopurinol (ZYLOPRIM) 300 MG tablet Take 300 mg by mouth daily.      Marland Kitchen amLODipine (NORVASC) 5 MG tablet Take 5 mg by mouth daily.      Marland Kitchen aspirin 325 MG tablet Take 325 mg by mouth daily.        . Coenzyme Q10 (COQ10 PO) Take 1 capsule by mouth daily.       . finasteride (PROSCAR) 5 MG tablet Take 5 mg by mouth as needed.        . furosemide (LASIX) 20 MG tablet Take 1 tablet (20 mg total) by mouth daily.  35 tablet  11  . isosorbide mononitrate (IMDUR) 30 MG 24 hr tablet Take 30 mg by mouth daily.        . nitroGLYCERIN (NITROSTAT) 0.4 MG SL tablet Place 1 tablet (0.4 mg total) under the tongue every 5 (five) minutes as needed for chest pain.  25 tablet  3  . rosuvastatin (CRESTOR) 10 MG tablet Take 10 mg by mouth at bedtime.        Allergies  Allergen Reactions  . Zocor (Simvastatin)     Past Medical History  Diagnosis Date  . Mitral insufficiency     CHRONIC  . CHF (congestive heart failure)     WITH MODERATE SYSTOLIC DYSFUNCTION  . Hypertension   . Hyperlipidemia   . 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 Coronary artery disease     WITH REMOTE LATERAL  MYOCARDIAL INFARCTION; s/p Myoview 2/13 with consistent findings of known LCX occlusion and moderate LV dysfunction; EF is 40%  . Dizziness   . Myocardial infarct, old   . Raynaud's disease /phenomenon   . Hard of hearing     bilaterally  . Gout   . PTSD (post-traumatic stress disorder)     "being treated for it now but not a big degree"  . Shortness of breath     "usually at night; lying down"  . Asthma   . Mitral insufficiency   . Second degree AV block, Mobitz type I 2012    improved off of atenolol  . Noncompliance with medication regimen     Past Surgical History  Procedure Date  . Cardiac catheterization 2003    SHOWED OCCLUSION OF THE LEFT CIRCUMFLEX CORONARY ARTERY  . Appendectomy   . Prostatectomy   . Tonsillectomy   . Inguinal hernia repair     left    History  Smoking status  . Former Smoker -- 1.5 packs/day for 20 years  . Quit date: 12/12/1970  Smokeless tobacco  . Never Used    History  Alcohol Use  . Yes    "very little"    Family History  Problem Relation Age of Onset  . Heart attack Brother     Review of Systems: The review of systems is positive for hearing deficit.  All other systems were reviewed and are negative.  Physical Exam: BP 152/88  Pulse 66  Ht 5' 10.5" (1.791 m)  Wt 191 lb (86.637 kg)  BMI 27.02 kg/m2 Patient is very pleasant and in no acute distress. He looks younger than his stated age. Skin is warm and dry. Color is normal.  HEENT is unremarkable. Normocephalic/atraumatic. PERRL. Sclera are nonicteric. Neck is supple. No masses. No JVD. Lungs are clear. Cardiac exam shows a regular rate and rhythm. Abdomen is soft. Extremities are without edema. Gait and ROM are intact. No gross neurologic deficits noted.   Lab Results  Component Value Date   WBC 12.5* 08/30/2011   HGB 13.3 08/30/2011   HCT 38.8* 08/30/2011   PLT 199 08/30/2011   GLUCOSE 87 08/29/2011   CHOL 131 08/29/2011   TRIG 75 08/29/2011   HDL 46 08/29/2011   LDLCALC  70 08/29/2011   ALT 25 08/29/2011   AST 15 08/29/2011   NA 138 08/29/2011   K 5.0 08/29/2011   CL 105 08/29/2011   CREATININE 1.06 08/29/2011   BUN 17 08/29/2011   CO2 26 08/29/2011   INR 1.08 08/29/2011   HGBA1C 6.5* 08/29/2011    Assessment / Plan:

## 2011-09-13 NOTE — Patient Instructions (Signed)
Continue with your current medicines.  Limit sodium intake. Goal is to have less than 2000 mg (2gm) of salt per day.  We will see you back in about 3 months.  Call the [facility] office at [phone] if you have any questions, problems or concerns.

## 2011-09-13 NOTE — Assessment & Plan Note (Signed)
Has had recent lipids. Will continue with statin therapy.

## 2011-10-01 ENCOUNTER — Other Ambulatory Visit: Payer: Self-pay | Admitting: Cardiology

## 2011-10-01 NOTE — Telephone Encounter (Signed)
Fu call °Pt calling back again °

## 2011-10-01 NOTE — Telephone Encounter (Signed)
Patient called advised to continue Lasix 20 mg daily.

## 2011-10-01 NOTE — Telephone Encounter (Signed)
Pt wants to know if he needs to still take lasix he read the side effects and they seem sketchy so he is not sure if he needs to be taking this

## 2011-10-07 DIAGNOSIS — R0989 Other specified symptoms and signs involving the circulatory and respiratory systems: Secondary | ICD-10-CM | POA: Diagnosis not present

## 2011-10-07 DIAGNOSIS — I10 Essential (primary) hypertension: Secondary | ICD-10-CM | POA: Diagnosis not present

## 2011-10-07 DIAGNOSIS — R0609 Other forms of dyspnea: Secondary | ICD-10-CM | POA: Diagnosis not present

## 2011-10-07 MED ORDER — FUROSEMIDE 20 MG PO TABS: 20.0000 mg | ORAL_TABLET | Freq: Every day | ORAL | Status: DC

## 2011-10-07 NOTE — Telephone Encounter (Signed)
Patient advised to continue Lasix 20 mg daily.

## 2011-10-20 ENCOUNTER — Other Ambulatory Visit: Payer: Self-pay

## 2011-10-20 ENCOUNTER — Encounter (HOSPITAL_COMMUNITY): Payer: Self-pay | Admitting: Emergency Medicine

## 2011-10-20 ENCOUNTER — Emergency Department (HOSPITAL_COMMUNITY): Payer: Medicare Other

## 2011-10-20 ENCOUNTER — Inpatient Hospital Stay (HOSPITAL_COMMUNITY)
Admission: EM | Admit: 2011-10-20 | Discharge: 2011-10-22 | DRG: 293 | Disposition: A | Discharge status: Home / Self Care | Payer: Medicare Other | Attending: Cardiology | Admitting: Cardiology

## 2011-10-20 DIAGNOSIS — I5043 Acute on chronic combined systolic (congestive) and diastolic (congestive) heart failure: Principal | ICD-10-CM | POA: Diagnosis present

## 2011-10-20 DIAGNOSIS — I219 Acute myocardial infarction, unspecified: Secondary | ICD-10-CM | POA: Diagnosis not present

## 2011-10-20 DIAGNOSIS — E785 Hyperlipidemia, unspecified: Secondary | ICD-10-CM | POA: Diagnosis not present

## 2011-10-20 DIAGNOSIS — I509 Heart failure, unspecified: Secondary | ICD-10-CM | POA: Diagnosis not present

## 2011-10-20 DIAGNOSIS — I2510 Atherosclerotic heart disease of native coronary artery without angina pectoris: Secondary | ICD-10-CM | POA: Diagnosis not present

## 2011-10-20 DIAGNOSIS — I252 Old myocardial infarction: Secondary | ICD-10-CM

## 2011-10-20 DIAGNOSIS — I059 Rheumatic mitral valve disease, unspecified: Secondary | ICD-10-CM | POA: Diagnosis present

## 2011-10-20 DIAGNOSIS — J90 Pleural effusion, not elsewhere classified: Secondary | ICD-10-CM | POA: Diagnosis not present

## 2011-10-20 DIAGNOSIS — R001 Bradycardia, unspecified: Secondary | ICD-10-CM | POA: Diagnosis present

## 2011-10-20 DIAGNOSIS — Z91199 Patient's noncompliance with other medical treatment and regimen due to unspecified reason: Secondary | ICD-10-CM

## 2011-10-20 DIAGNOSIS — I498 Other specified cardiac arrhythmias: Secondary | ICD-10-CM | POA: Diagnosis not present

## 2011-10-20 DIAGNOSIS — I10 Essential (primary) hypertension: Secondary | ICD-10-CM | POA: Diagnosis present

## 2011-10-20 DIAGNOSIS — Z87891 Personal history of nicotine dependence: Secondary | ICD-10-CM | POA: Diagnosis not present

## 2011-10-20 DIAGNOSIS — I340 Nonrheumatic mitral (valve) insufficiency: Secondary | ICD-10-CM

## 2011-10-20 DIAGNOSIS — F4310 Post-traumatic stress disorder, unspecified: Secondary | ICD-10-CM | POA: Diagnosis present

## 2011-10-20 DIAGNOSIS — Z9119 Patient's noncompliance with other medical treatment and regimen: Secondary | ICD-10-CM | POA: Diagnosis not present

## 2011-10-20 DIAGNOSIS — I2589 Other forms of chronic ischemic heart disease: Secondary | ICD-10-CM | POA: Diagnosis present

## 2011-10-20 DIAGNOSIS — R0602 Shortness of breath: Secondary | ICD-10-CM | POA: Diagnosis not present

## 2011-10-20 LAB — BASIC METABOLIC PANEL
BUN: 18 mg/dL (ref 6–23)
CO2: 23 mEq/L (ref 19–32)
Chloride: 104 mEq/L (ref 96–112)
Creatinine, Ser: 1.03 mg/dL (ref 0.50–1.35)
GFR calc Af Amer: 72 mL/min — ABNORMAL LOW (ref >90)
Glucose, Bld: 113 mg/dL — ABNORMAL HIGH (ref 70–99)
Potassium: 4 mEq/L (ref 3.5–5.1)

## 2011-10-20 LAB — CBC
HCT: 39.5 % (ref 39.0–52.0)
Hemoglobin: 13.1 g/dL (ref 13.0–17.0)
MCH: 28.2 pg (ref 26.0–34.0)
MCHC: 33.2 g/dL (ref 30.0–36.0)
MCV: 84.9 fL (ref 78.0–100.0)
RDW: 15.8 % — ABNORMAL HIGH (ref 11.5–15.5)

## 2011-10-20 LAB — POCT I-STAT TROPONIN I: Troponin i, poc: 0.01 ng/mL (ref 0.00–0.08)

## 2011-10-20 MED ORDER — SODIUM CHLORIDE 0.9 % IV SOLN: 250.0000 mL | INTRAVENOUS | Status: DC | PRN

## 2011-10-20 MED ORDER — ACETAMINOPHEN 325 MG PO TABS
650.0000 mg | ORAL_TABLET | ORAL | Status: DC | PRN
  Filled 2011-10-20: qty 2

## 2011-10-20 MED ORDER — FUROSEMIDE 10 MG/ML IJ SOLN
40.0000 mg | Freq: Two times a day (BID) | INTRAMUSCULAR | Status: DC
  Administered 2011-10-20 – 2011-10-22 (×4): 40 mg via INTRAVENOUS
  Filled 2011-10-20 (×6): qty 4

## 2011-10-20 MED ORDER — ALLOPURINOL 150 MG HALF TABLET
350.0000 mg | ORAL_TABLET | Freq: Every day | ORAL | Status: DC
  Administered 2011-10-20 – 2011-10-22 (×3): 350 mg via ORAL
  Filled 2011-10-20 (×3): qty 2

## 2011-10-20 MED ORDER — FUROSEMIDE 10 MG/ML IJ SOLN
40.0000 mg | Freq: Once | INTRAMUSCULAR | Status: AC
  Administered 2011-10-20: 40 mg via INTRAVENOUS
  Filled 2011-10-20: qty 4

## 2011-10-20 MED ORDER — AMLODIPINE BESYLATE 5 MG PO TABS
5.0000 mg | ORAL_TABLET | Freq: Every day | ORAL | Status: DC
  Administered 2011-10-20 – 2011-10-22 (×3): 5 mg via ORAL
  Filled 2011-10-20 (×3): qty 1

## 2011-10-20 MED ORDER — ASPIRIN 325 MG PO TABS
325.0000 mg | ORAL_TABLET | Freq: Every day | ORAL | Status: DC
  Administered 2011-10-20 – 2011-10-22 (×3): 325 mg via ORAL
  Filled 2011-10-20 (×3): qty 1

## 2011-10-20 MED ORDER — ATORVASTATIN CALCIUM 20 MG PO TABS
20.0000 mg | ORAL_TABLET | Freq: Every day | ORAL | Status: DC
  Administered 2011-10-21: 20 mg via ORAL
  Filled 2011-10-20 (×2): qty 1

## 2011-10-20 MED ORDER — ISOSORBIDE MONONITRATE ER 30 MG PO TB24
30.0000 mg | ORAL_TABLET | Freq: Every day | ORAL | Status: DC
  Administered 2011-10-20 – 2011-10-22 (×3): 30 mg via ORAL
  Filled 2011-10-20 (×3): qty 1

## 2011-10-20 MED ORDER — POTASSIUM CHLORIDE CRYS ER 20 MEQ PO TBCR
20.0000 meq | EXTENDED_RELEASE_TABLET | Freq: Every day | ORAL | Status: DC
  Administered 2011-10-20 – 2011-10-22 (×3): 20 meq via ORAL
  Filled 2011-10-20 (×3): qty 1

## 2011-10-20 MED ORDER — LOSARTAN POTASSIUM 50 MG PO TABS
100.0000 mg | ORAL_TABLET | Freq: Every day | ORAL | Status: DC
  Administered 2011-10-20 – 2011-10-22 (×3): 100 mg via ORAL
  Filled 2011-10-20 (×3): qty 2

## 2011-10-20 MED ORDER — ALPRAZOLAM 0.25 MG PO TABS: 0.2500 mg | ORAL_TABLET | Freq: Two times a day (BID) | ORAL | Status: DC | PRN

## 2011-10-20 MED ORDER — NITROGLYCERIN 0.4 MG SL SUBL: 0.4000 mg | SUBLINGUAL_TABLET | SUBLINGUAL | Status: DC | PRN

## 2011-10-20 MED ORDER — SODIUM CHLORIDE 0.9 % IJ SOLN
3.0000 mL | Freq: Two times a day (BID) | INTRAMUSCULAR | Status: DC
  Administered 2011-10-20 – 2011-10-22 (×4): 3 mL via INTRAVENOUS

## 2011-10-20 MED ORDER — LOSARTAN POTASSIUM 50 MG PO TABS: 100.0000 mg | ORAL_TABLET | Freq: Every day | ORAL | Status: DC

## 2011-10-20 MED ORDER — ONDANSETRON HCL 4 MG/2ML IJ SOLN: 4.0000 mg | Freq: Four times a day (QID) | INTRAMUSCULAR | Status: DC | PRN

## 2011-10-20 MED ORDER — SODIUM CHLORIDE 0.9 % IJ SOLN
3.0000 mL | INTRAMUSCULAR | Status: DC | PRN
  Administered 2011-10-20: 3 mL via INTRAVENOUS

## 2011-10-20 NOTE — ED Notes (Signed)
Pt. Stated, I started having some SOB 2 weeks ago and now I've had some dizziness

## 2011-10-20 NOTE — ED Provider Notes (Signed)
History     CSN: [id number]  Arrival date & time 10/20/11  1001   First MD Initiated Contact with Patient 10/20/11 1017     Chief Complaint  Patient presents with  . Shortness of Breath   HPI Pt states he has been feeling a little short of breath for a few months off and on.  He did see his doctor a week or so ago and checked out OK.  Pt states yesterday however it started to get a bit worse.  No cough.  The dyspnea is worse with exertion or movement.  The symptoms increase with lying flat.  No fever, vomit or diarrhea.  No pain in his chest.  Patient states he got dizzy this morning when he tried to stand up straight came to the emergency room. He denies any significant discomfort or pain at this time. Past Medical History  Diagnosis Date  . Mitral insufficiency     CHRONIC  . CHF (congestive heart failure)     WITH MODERATE SYSTOLIC DYSFUNCTION  . Hypertension   . Hyperlipidemia   . 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 Coronary artery disease     WITH REMOTE LATERAL MYOCARDIAL INFARCTION; s/p Myoview 2/13 with consistent findings of known LCX occlusion and moderate LV dysfunction; EF is 40%  . Dizziness   . Myocardial infarct, old   . Raynaud's disease /phenomenon   . Hard of hearing     bilaterally  . Gout   . PTSD (post-traumatic stress disorder)     "being treated for it now but not a big degree"  . Shortness of breath     "usually at night; lying down"  . Asthma   . Mitral insufficiency   . Second degree AV block, Mobitz type I 2012    improved off of atenolol  . Noncompliance with medication regimen     Past Surgical History  Procedure Date  . Cardiac catheterization 2003    SHOWED OCCLUSION OF THE LEFT CIRCUMFLEX CORONARY ARTERY  . Appendectomy   . Prostatectomy   . Tonsillectomy   . Inguinal hernia repair     left    Family History  Problem Relation Age of Onset  . Heart attack Brother     History  Substance Use Topics  . Smoking status:  Former Smoker -- 1.5 packs/day for 20 years    Quit date: 12/12/1970  . Smokeless tobacco: Never Used  . Alcohol Use: Yes     "very little"      Review of Systems  Constitutional: Negative for fever.  Respiratory: Negative for choking.   Cardiovascular: Negative for leg swelling.  Neurological: Positive for light-headedness.  All other systems reviewed and are negative.    Allergies  Zocor  Home Medications   Current Outpatient Rx  Name Route Sig Dispense Refill  . ALLOPURINOL 300 MG PO TABS Oral Take 350 mg by mouth daily.     Marland Kitchen AMLODIPINE BESYLATE 5 MG PO TABS Oral Take 5 mg by mouth daily.    . ASPIRIN 325 MG PO TABS Oral Take 325 mg by mouth daily.      . COQ10 PO Oral Take 1 capsule by mouth daily.     Marland Kitchen FINASTERIDE 5 MG PO TABS Oral Take 5 mg by mouth as needed.      . FUROSEMIDE 20 MG PO TABS Oral Take 20 mg by mouth daily.    . ISOSORBIDE MONONITRATE ER 30  MG PO TB24 Oral Take 30 mg by mouth daily.      Marland Kitchen LOSARTAN POTASSIUM 100 MG PO TABS Oral Take 100 mg by mouth daily.    . NEBIVOLOL HCL 10 MG PO TABS Oral Take 10 mg by mouth every morning.    Marland Kitchen NITROGLYCERIN 0.4 MG SL SUBL Sublingual Place 0.4 mg under the tongue every 5 (five) minutes as needed.    Marland Kitchen ROSUVASTATIN CALCIUM 10 MG PO TABS Oral Take 10 mg by mouth at bedtime.      BP 146/84  Pulse 66  Temp(Src) 97.9 F (36.6 C) (Oral)  Resp 22  SpO2 98%  Physical Exam  Nursing note and vitals reviewed. Constitutional: He appears well-developed and well-nourished. No distress.  HENT:  Head: Normocephalic and atraumatic.  Right Ear: External ear normal.  Left Ear: External ear normal.  Eyes: Conjunctivae are normal. Right eye exhibits no discharge. Left eye exhibits no discharge. No scleral icterus.  Neck: Neck supple. No tracheal deviation present.  Cardiovascular: Normal rate, regular rhythm and intact distal pulses.   Pulmonary/Chest: Effort normal. No stridor. No respiratory distress. He has no wheezes.  He has rales (at bases).  Abdominal: Soft. Bowel sounds are normal. He exhibits no distension. There is no tenderness. There is no rebound and no guarding.  Musculoskeletal: He exhibits no edema and no tenderness.  Neurological: He is alert. He has normal strength. No sensory deficit. Cranial nerve deficit:  no gross defecits noted. He exhibits normal muscle tone. He displays no seizure activity. Coordination normal.  Skin: Skin is warm and dry. No rash noted.  Psychiatric: He has a normal mood and affect.    ED Course  Procedures (including critical care time)  Labs Reviewed  BASIC METABOLIC PANEL - Abnormal; Notable for the following:    Glucose, Bld 113 (*)    GFR calc non Af Amer 62 (*)    GFR calc Af Amer 72 (*)    All other components within normal limits  CBC - Abnormal; Notable for the following:    RDW 15.8 (*)    All other components within normal limits  PRO B NATRIURETIC PEPTIDE - Abnormal; Notable for the following:    Pro B Natriuretic peptide (BNP) 1611.0 (*)    All other components within normal limits  POCT I-STAT TROPONIN I  PRO B NATRIURETIC PEPTIDE   Dg Chest 2 View  10/20/2011  *RADIOLOGY REPORT*  Clinical Data: Short of breath  CHEST - 2 VIEW  Comparison: CT 08/30/2011  Findings: Normal cardiac silhouette.  There is central thickening of the bronchovascular markings.  Bilateral small effusions.  No focal consolidation.  No pneumothorax.  IMPRESSION:  1.  Thickening of the central bronchovascular markings suggest bronchitis, viral process, reactive airway disease, or granulomas disease and less likely interstitial edema.  This appears worsened in comparison CT.  Original Report Authenticated By: Genevive Bi, M.D.     1. CHF (congestive heart failure)       MDM  I suspect the patient's symptoms are related to his congestive heart failure. He does have an increase in his Bnp.  His chest x-ray however, is not specific for pulmonary edema. Overall, I doubt  infectious etiology based on the constellation of his symptoms and his prior history.  Patient is in no significant distress however he is tachypneic at rest. I have discussed the case with Dr. Donnie Aho. He will have someone come down and evaluate the patient to see if he would be  better served by observation and admission versus adjustment of his diuretics with close outpatient followup       Celene Kras, MD 10/20/11 [phone]

## 2011-10-20 NOTE — ED Notes (Signed)
Pt. Stated, When I was cleaning up this morning I really got dizziness and thought I should get it checked out.  No pain now.

## 2011-10-20 NOTE — ED Notes (Signed)
Pt here for sob, sts onset last night, and now unable to do his normal routine, pt with labored respirations, sts he has dizzy spells.  Also, when he attempts to take a breath feels lik ehe doesn't fill his lungs up.

## 2011-10-20 NOTE — ED Notes (Signed)
Pt given meal tray and awaiting cardiology to see pt for possible admission or discharge.

## 2011-10-20 NOTE — H&P (Signed)
HPI: 78 year old Craig [name] with past medical history of coronary artery disease and congestive heart failure with dyspnea. Patient's last Myoview was performed in February of 2013. Ejection fraction was 40%. There was hypokinesis of the inferior lateral wall. There was an inferior lateral scar with question slight peri-infarct ischemia. The patient was treated medically. His defect apparently correlated with previously known occluded circumflex. Patient states that for the past 2 weeks he has had progressive dyspnea on exertion. Today he has noted orthopnea but denies pedal edema. He has an uncomfortable sensation in his chest with lying flat but denies chest pain otherwise. He denies fevers, chills, productive cough. He was admitted with probable CHF. Note he is not on a beta blocker as he has had difficulties with bradycardia and second-degree AV block previously.  Medications Prior to Admission  Medication Dose Route Frequency Provider Last Rate Last Dose  . furosemide (LASIX) injection 40 mg  40 mg Intravenous Once Celene Kras, MD   40 mg at 10/20/11 1450   Medications Prior to Admission  Medication Sig Dispense Refill  . allopurinol (ZYLOPRIM) 300 MG tablet Take 350 mg by mouth daily.       Marland Kitchen amLODipine (NORVASC) 5 MG tablet Take 5 mg by mouth daily.      Marland Kitchen aspirin 325 MG tablet Take 325 mg by mouth daily.        . Coenzyme Q10 (COQ10 PO) Take 1 capsule by mouth daily.       . finasteride (PROSCAR) 5 MG tablet Take 5 mg by mouth as needed.        . isosorbide mononitrate (IMDUR) 30 MG 24 hr tablet Take 30 mg by mouth daily.        . rosuvastatin (CRESTOR) 10 MG tablet Take 10 mg by mouth at bedtime.      Marland Kitchen DISCONTD: furosemide (LASIX) 20 MG tablet Take 1 tablet (20 mg total) by mouth daily.  35 tablet  11  . DISCONTD: nitroGLYCERIN (NITROSTAT) 0.4 MG SL tablet Place 1 tablet (0.4 mg total) under the tongue every 5 (five) minutes as needed for chest pain.  25 tablet  3    Allergies  Allergen  Reactions  . Zocor (Simvastatin)     Past Medical History  Diagnosis Date  . Mitral insufficiency     CHRONIC  . CHF (congestive heart failure)     WITH MODERATE SYSTOLIC DYSFUNCTION  . Hypertension   . Hyperlipidemia   . 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 Coronary artery disease     WITH REMOTE LATERAL MYOCARDIAL INFARCTION; s/p Myoview 2/13 with consistent findings of known LCX occlusion and moderate LV dysfunction; EF is 40%  . Myocardial infarct, old   . Raynaud's disease /phenomenon   . Hard of hearing     bilaterally  . Gout   . PTSD (post-traumatic stress disorder)     "being treated for it now but not a big degree"  . Asthma   . Noncompliance with medication regimen     Past Surgical History  Procedure Date  . Cardiac catheterization 2003    SHOWED OCCLUSION OF THE LEFT CIRCUMFLEX CORONARY ARTERY  . Appendectomy   . Prostatectomy   . Tonsillectomy   . Inguinal hernia repair     left    History   Social History  . Marital Status: Married    Spouse Name: N/A    Number of Children: 8  . Years of Education:  N/A   Occupational History  . Chief Technology Officer    Social History Main Topics  . Smoking status: Former Smoker -- 1.5 packs/day for 20 years    Quit date: 12/12/1970  . Smokeless tobacco: Never Used  . Alcohol Use: No  . Drug Use: No  . Sexually Active: No   Other Topics Concern  . Not on file   Social History Narrative  . No narrative on file    Family History  Problem Relation Age of Onset  . Heart attack Brother     ROS:  no fevers or chills, productive cough, hemoptysis, dysphasia, odynophagia, melena, hematochezia, dysuria, hematuria, rash, seizure activity, claudication. Remaining systems are negative.  Physical Exam:   Blood pressure 154/Craig, pulse 57, temperature 97.9 F (36.6 C), temperature source Oral, resp. rate 26, SpO2 99.00%.  General:  Well developed/well nourished in mild respiratory distress Skin  warm/dry Patient not depressed No peripheral clubbing Back-normal HEENT-normal/normal eyelids Neck supple/normal carotid upstroke bilaterally; no bruits; no thyromegaly chest - diminished breath sounds throughout with mild rhonchi CV - RRR/normal S1 and S2; no rubs or gallops;  PMI nondisplaced; 3/6 systolic murmur apex Abdomen -NT/ND, no HSM, no mass, + bowel sounds, no bruit 2+ femoral pulses, no bruits Ext-trace edema, no chords, 2+ DP Neuro-grossly nonfocal  ECG sinus rhythm, first degree AV block, no ST changes.  Results for orders placed during the hospital encounter of 10/20/11 (from the past 48 hour(s))  BASIC METABOLIC PANEL     Status: Abnormal   Collection Time   10/20/11 10:31 AM      Component Value Range Comment   Sodium 137  135 - 145 (mEq/L)    Potassium 4.0  3.5 - 5.1 (mEq/L)    Chloride 104  96 - 112 (mEq/L)    CO2 23  19 - 32 (mEq/L)    Glucose, Bld 113 (*) 70 - 99 (mg/dL)    BUN 18  6 - 23 (mg/dL)    Creatinine, Ser 1.61  0.50 - 1.35 (mg/dL)    Calcium 9.3  8.4 - 10.5 (mg/dL)    GFR calc non Af Amer 62 (*) >90 (mL/min)    GFR calc Af Amer 72 (*) >90 (mL/min)   CBC     Status: Abnormal   Collection Time   10/20/11 10:31 AM      Component Value Range Comment   WBC 9.5  4.0 - 10.5 (K/uL)    RBC 4.65  4.22 - 5.81 (MIL/uL)    Hemoglobin 13.1  13.0 - 17.0 (g/dL)    HCT 09.6  04.5 - 40.9 (%)    MCV Craig.9  78.0 - 100.0 (fL)    MCH 28.2  26.0 - 34.0 (pg)    MCHC 33.2  30.0 - 36.0 (g/dL)    RDW 81.1 (*) 91.4 - 15.5 (%)    Platelets 200  150 - 400 (K/uL)   PRO B NATRIURETIC PEPTIDE     Status: Abnormal   Collection Time   10/20/11 10:31 AM      Component Value Range Comment   Pro B Natriuretic peptide (BNP) 1611.0 (*) 0 - 450 (pg/mL)   POCT I-STAT TROPONIN I     Status: Normal   Collection Time   10/20/11 10:42 AM      Component Value Range Comment   Troponin i, poc 0.01  0.00 - 0.08 (ng/mL)    Comment 3  Dg Chest 2 View  10/20/2011  *RADIOLOGY  REPORT*  Clinical Data: Short of breath  CHEST - 2 VIEW  Comparison: CT 08/30/2011  Findings: Normal cardiac silhouette.  There is central thickening of the bronchovascular markings.  Bilateral small effusions.  No focal consolidation.  No pneumothorax.  IMPRESSION:  1.  Thickening of the central bronchovascular markings suggest bronchitis, viral process, reactive airway disease, or granulomas disease and less likely interstitial edema.  This appears worsened in comparison CT.  Original Report Authenticated By: Genevive Bi, M.D.    Assessment/Plan #1-congestive heart failure-the patient appears to have acute on chronic combined systolic and diastolic congestive heart failure. Begin diuresis with Lasix 40 mg IV twice a day. Follow renal function closely. Arrange echocardiogram to reassess LV function. Patient also has significant mitral regurgitation murmur on examination but based on chart this apparently is chronic. #2-coronary artery disease-continue aspirin and statin. Patient with uncomfortable sensation in his chest. Doubt ischemia. Cycle enzymes. If negative no further ischemia evaluation as he had a recent low risk Myoview. #3-ischemic cardiomyopathy-continue ARB. No beta blocker as he has had difficulties with bradycardia and second-degree AV block previously. #4-hypertension-continue present medications and monitor. #5-hyperlipidemia-continue statin.  Olga Millers MD 10/20/2011, 3:07 PM

## 2011-10-20 NOTE — ED Notes (Signed)
Patient is resting comfortably.Dr Jens Som at bedside.

## 2011-10-20 NOTE — ED Notes (Signed)
Family at bedside. 

## 2011-10-21 DIAGNOSIS — I10 Essential (primary) hypertension: Secondary | ICD-10-CM

## 2011-10-21 DIAGNOSIS — I2510 Atherosclerotic heart disease of native coronary artery without angina pectoris: Secondary | ICD-10-CM

## 2011-10-21 DIAGNOSIS — R001 Bradycardia, unspecified: Secondary | ICD-10-CM | POA: Diagnosis present

## 2011-10-21 DIAGNOSIS — I519 Heart disease, unspecified: Secondary | ICD-10-CM

## 2011-10-21 DIAGNOSIS — I498 Other specified cardiac arrhythmias: Secondary | ICD-10-CM

## 2011-10-21 LAB — BASIC METABOLIC PANEL
BUN: 22 mg/dL (ref 6–23)
CO2: 26 mEq/L (ref 19–32)
Chloride: 104 mEq/L (ref 96–112)
Creatinine, Ser: 1.13 mg/dL (ref 0.50–1.35)

## 2011-10-21 LAB — CARDIAC PANEL(CRET KIN+CKTOT+MB+TROPI): Relative Index: 2.4 (ref 0.0–2.5)

## 2011-10-21 MED ORDER — SENNOSIDES-DOCUSATE SODIUM 8.6-50 MG PO TABS
1.0000 | ORAL_TABLET | Freq: Two times a day (BID) | ORAL | Status: DC | PRN
  Administered 2011-10-21: 1 via ORAL
  Filled 2011-10-21 (×2): qty 1

## 2011-10-21 NOTE — Progress Notes (Signed)
10/21/11 1630 UR Completed. Tera Mater, RN, BSN Nurse Case Manager [phone]

## 2011-10-21 NOTE — Progress Notes (Signed)
Patient has been sr/sb on monitor since admission but has been dropping into the 30's & 40's-with the lowest being 30 before returning to 50's since falling asleep. Patient is easily arousable and asymptomatic with stable vital signs.  MD on call paged Margo Aye with [facility]), currently awaiting return call. Will continue to monitor.  awalker,rn

## 2011-10-21 NOTE — Progress Notes (Signed)
*  PRELIMINARY RESULTS* Echocardiogram 2D Echocardiogram has been performed.  Glean Salen [facility] 10/21/2011, 4:17 PM

## 2011-10-21 NOTE — Progress Notes (Signed)
TELEMETRY: Reviewed telemetry pt in sinus bradycardia with HR as low as 38 bpm, occ. PACs: Filed Vitals:   10/20/11 1904 10/20/11 2232 10/21/11 0221 10/21/11 0711  BP: 157/82 130/74 105/64 115/67  Pulse: 61 56 57 111  Temp: 97.5 F (36.4 C) 98 F (36.7 C) 97.7 F (36.5 C) 98.8 F (37.1 C)  TempSrc: Oral Oral Oral Oral  Resp: 20 18 20 18   Height: 5\' 10"  (1.778 m)     Weight: 84.2 kg (185 lb 10 oz)  83.2 kg (183 lb 6.8 oz)   SpO2: 98% 98% 96% 100%    Intake/Output Summary (Last 24 hours) at 10/21/11 0828 Last data filed at 10/21/11 0553  Gross per 24 hour  Intake    483 ml  Output   2605 ml  Net  -2122 ml    SUBJECTIVE States breathing is much better. Felt some lightheadedness yesterday when standing. Has felt weak for 1 week. No chest pain.  LABS: Basic Metabolic Panel:  Basename 10/21/11 0542 10/20/11 1031  NA 140 137  K 4.4 4.0  CL 104 104  CO2 26 23  GLUCOSE 112* 113*  BUN 22 18  CREATININE 1.13 1.03  CALCIUM 8.9 9.3  MG -- --  PHOS -- --   Liver Function Tests: No results found for this basename: AST:2,ALT:2,ALKPHOS:2,BILITOT:2,PROT:2,ALBUMIN:2 in the last 72 hours No results found for this basename: LIPASE:2,AMYLASE:2 in the last 72 hours CBC:  Basename 10/20/11 1031  WBC 9.5  NEUTROABS --  HGB 13.1  HCT 39.5  MCV 84.9  PLT 200   Cardiac Enzymes: No results found for this basename: CKTOTAL:3,CKMB:3,CKMBINDEX:3,TROPONINI:3 in the last 72 hours BNP: No components found with this basename: POCBNP:3 D-Dimer: No results found for this basename: DDIMER:2 in the last 72 hours Hemoglobin A1C: No results found for this basename: HGBA1C in the last 72 hours Fasting Lipid Panel: No results found for this basename: CHOL,HDL,LDLCALC,TRIG,CHOLHDL,LDLDIRECT in the last 72 hours Thyroid Function Tests: No results found for this basename: TSH,T4TOTAL,FREET3,T3FREE,THYROIDAB in the last 72 hours Anemia Panel: No results found for this basename:  VITAMINB12,FOLATE,FERRITIN,TIBC,IRON,RETICCTPCT in the last 72 hours  Radiology/Studies:  Dg Chest 2 View  10/20/2011  *RADIOLOGY REPORT*  Clinical Data: Short of breath  CHEST - 2 VIEW  Comparison: CT 08/30/2011  Findings: Normal cardiac silhouette.  There is central thickening of the bronchovascular markings.  Bilateral small effusions.  No focal consolidation.  No pneumothorax.  IMPRESSION:  1.  Thickening of the central bronchovascular markings suggest bronchitis, viral process, reactive airway disease, or granulomas disease and less likely interstitial edema.  This appears worsened in comparison CT.  Original Report Authenticated By: Genevive Bi, M.D.    PHYSICAL EXAM General: Well developed, well nourished, elderly male in no acute distress. Head: Normocephalic, atraumatic, sclera non-icteric, no xanthomas, nares are without discharge. Wears a hearing aid. Is hard of hearing Neck: Negative for carotid bruits. JVD mildly elevated. Lungs: diminished breath sounds at bases. Otherwise clear. Heart: RRR S1 S2 with 1-2/6 systolic murmur at the apex to left axilla  Abdomen: Soft, non-tender, non-distended with normoactive bowel sounds. No hepatomegaly. No rebound/guarding. No obvious abdominal masses. Msk:  Strength and tone appears normal for age. Extremities: No clubbing, cyanosis or edema.  Distal pedal pulses are 2+ and equal bilaterally. Neuro: Alert and oriented X 3. Moves all extremities spontaneously. Psych:  Responds to questions appropriately with a normal affect.  ASSESSMENT AND PLAN: 1. Acute on chronic combined systolic and diastolic heart failure. Improved with  IV diuresis. Continue IV lasix. On cozaar. Not a candidate for beta blocker due to bradycardia. Prior EF 40%. 2. CAD with remote lateral MI. Known occlusion of the LCX by cath 2003. Initial cardiac enzymes were negative. No follow up labs done. 3. Chronic mitral insufficiency. 4. History of noncompliance, patient  reports compliance with diuretic Rx. He has poor understanding of dietary sodium restriction. 5. HTN. 6. Hyperlipidemia on statin Rx. 7. Sinus bradycardia. No significant pauses. Will monitor closely.  Active Problems:  Acute on chronic combined systolic and diastolic heart failure    Signed, Charmin Aguiniga [place] MD, [facility] 10/21/2011 8:37 AM

## 2011-10-22 LAB — BASIC METABOLIC PANEL
CO2: 25 mEq/L (ref 19–32)
Chloride: 104 mEq/L (ref 96–112)
Sodium: 140 mEq/L (ref 135–145)

## 2011-10-22 MED ORDER — FUROSEMIDE 20 MG PO TABS: 40.0000 mg | ORAL_TABLET | Freq: Every day | ORAL | Status: DC

## 2011-10-22 MED ORDER — POTASSIUM CHLORIDE CRYS ER 10 MEQ PO TBCR: 10.0000 meq | EXTENDED_RELEASE_TABLET | Freq: Every day | ORAL | Status: DC

## 2011-10-22 NOTE — Discharge Summary (Signed)
Patient ID: Craig [name],  MRN: [id number], DOB/AGE: 09-Apr-1919 78 y.o.  Admit date: 10/20/2011 Discharge date: 10/22/2011  Primary Care Provider: [name], MD Primary Cardiologist: P. [place], MD  Discharge Diagnoses Principal Problem:  *Acute on chronic combined systolic and diastolic heart failure Active Problems:  MI, old  Mitral insufficiency  Hypertension  Hyperlipidemia  CAD (coronary artery disease)  Sinus bradycardia   Allergies Allergies  Allergen Reactions  . Zocor (Simvastatin)     Procedures  2D Echocardiogram 10/21/2011 Study Conclusions  - Left ventricle: Systolic function was mildly to moderately   reduced. The estimated ejection fraction was in the range   of 40% to 45%. Akinesis of the entireinferior myocardium;   consistent with infarction. - Pulmonary arteries: Systolic pressure was moderately to   severely increased. PA peak pressure: 57mm Hg (S). - Trace MR  History of Present Illness  78 y/o male with the above problem list.  He was in his USOH until approx 2 wks prior to admission when he began to experience progressive DOE.  On the evening prior to presentation, pt noted significant orthopnea thus prompting presentation to the [facility] ED.  In the ED, pt was felt to be volume overloaded and he was admitted for mgmt of acute on chronic mixed systolic and diastolic CHF.  Hospital Course  Following admission, pt was placed on IV lasix.  For this admission, he diuresed approximately 2.8 Liters with reduction in weight from 185 Lbs on admission to 182 Lbs today.  Echocardiogram was performed during this admission and showed an EF of 40-45% and trace MR.  With diuresis, pt has had significant clinical improvement with resolution of orthopnea and DOE.  He admits to adding copious amounts of salt to his food @ home and has been counseled to avoid doing this.  He will be discharged home today in good condition on slightly more Lasix than his  previous home dose (was 20mg  daily, now 40mg  daily).    Discharge Vitals Blood pressure 126/74, pulse 66, temperature 98.1 F (36.7 C), temperature source Oral, resp. rate 18, height 5\' 10"  (1.778 m), weight 182 lb 11.2 oz (82.872 kg), SpO2 97.00%.  Filed Weights   10/20/11 1904 10/21/11 0221 10/22/11 0619  Weight: 185 lb 10 oz (84.2 kg) 183 lb 6.8 oz (83.2 kg) 182 lb 11.2 oz (82.872 kg)   Labs  CBC  Basename 10/20/11 1031  WBC 9.5  NEUTROABS --  HGB 13.1  HCT 39.5  MCV 84.9  PLT 200   Basic Metabolic Panel  Basename 10/22/11 0640 10/21/11 0542  NA 140 140  K 3.9 4.4  CL 104 104  CO2 25 26  GLUCOSE 114* 112*  BUN 26* 22  CREATININE 1.19 1.13  CALCIUM 9.1 8.9  MG -- --  PHOS -- --   Cardiac Enzymes  Basename 10/21/11 0905  CKTOTAL 113  CKMB 2.7  CKMBINDEX --  TROPONINI <0.30   Disposition  Pt is being discharged home today in good condition.  Follow-up Plans & Appointments  Follow-up Information    Follow up with [name], MD. (As needed)       Follow up with Peter [place], MD on 12/18/2011. (1:30)    Contact information:   [address] [place] [address] [phone]       Follow up with Norma Fredrickson, NP on 10/30/2011. (2:30 PM)    Contact information:   [address] [place] [address] [phone]  Discharge Medications  Medication List  As of 10/22/2011 11:37 AM   STOP taking these medications         nebivolol 10 MG tablet         TAKE these medications         allopurinol 300 MG tablet   Commonly known as: ZYLOPRIM   Take 350 mg by mouth daily.      amLODipine 5 MG tablet   Commonly known as: NORVASC   Take 5 mg by mouth daily.      aspirin 325 MG tablet   Take 325 mg by mouth daily.      COQ10 PO   Take 1 capsule by mouth daily.      finasteride 5 MG tablet   Commonly known as: PROSCAR   Take 5 mg by mouth as needed.      furosemide 20 MG  tablet   Commonly known as: LASIX   Take 2 tablets (40 mg total) by mouth daily.      isosorbide mononitrate 30 MG 24 hr tablet   Commonly known as: IMDUR   Take 30 mg by mouth daily.      losartan 100 MG tablet   Commonly known as: COZAAR   Take 100 mg by mouth daily.      nitroGLYCERIN 0.4 MG SL tablet   Commonly known as: NITROSTAT   Place 0.4 mg under the tongue every 5 (five) minutes as needed.      potassium chloride 10 MEQ tablet   Commonly known as: K-DUR,KLOR-CON   Take 1 tablet (10 mEq total) by mouth daily.      rosuvastatin 10 MG tablet   Commonly known as: CRESTOR   Take 10 mg by mouth at bedtime.           Outstanding Labs/Studies  None  Duration of Discharge Encounter   Greater than 30 minutes including physician time.  Signed, Nicolasa Ducking NP 10/22/2011, 11:37 AM

## 2011-10-22 NOTE — Discharge Summary (Signed)
Patient seen and examined and history reviewed. Agree with above findings and plan. See note from earlier today.  Thedora Hinders, [facility] 10/22/2011 5:24 PM

## 2011-10-22 NOTE — Progress Notes (Signed)
Patient's IV and tele has been discontinued and patient is being discharged; patient verbalizes understanding of discharge information____________________________________________________________D. Craig [name]

## 2011-10-22 NOTE — Progress Notes (Signed)
Patient Name: Craig [name] Date of Encounter: 10/22/2011     Principal Problem:  *Acute on chronic combined systolic and diastolic heart failure Active Problems:  MI, old  Mitral insufficiency  Hypertension  Hyperlipidemia  CAD (coronary artery disease)  Sinus bradycardia   SUBJECTIVE  Pt denies SOB or chest pain. Up OOB ambulating in halls independently without difficulty. He notes marked improvement in feeling of "chest fullness" and orthopnea. He also notes that he is regaining his strength.  CURRENT MEDS    . allopurinol  350 mg Oral Daily  . amLODipine  5 mg Oral Daily  . aspirin  325 mg Oral Daily  . atorvastatin  20 mg Oral q1800  . furosemide  40 mg Intravenous BID  . isosorbide mononitrate  30 mg Oral Daily  . losartan  100 mg Oral Daily  . potassium chloride  20 mEq Oral Daily  . sodium chloride  3 mL Intravenous Q12H    OBJECTIVE  Filed Vitals:   10/21/11 2026 10/22/11 0144 10/22/11 0619 10/22/11 0838  BP: 123/78 101/57 139/70 113/68  Pulse: 65 53 57 66  Temp: 97.8 F (36.6 C) 97.8 F (36.6 C) 98.1 F (36.7 C)   TempSrc: Oral Oral Oral   Resp: 16 16 18    Height:      Weight:   182 lb 11.2 oz (82.872 kg)   SpO2: 99% 95% 97%     Intake/Output Summary (Last 24 hours) at 10/22/11 0946 Last data filed at 10/22/11 0850  Gross per 24 hour  Intake   1423 ml  Output   2125 ml  Net   -702 ml   Filed Weights   10/20/11 1904 10/21/11 0221 10/22/11 0619  Weight: 185 lb 10 oz (84.2 kg) 183 lb 6.8 oz (83.2 kg) 182 lb 11.2 oz (82.872 kg)    PHYSICAL EXAM  General: Pleasant, NAD. Neuro: Alert and oriented X 3. Moves all extremities spontaneously. Psych: Normal affect. HEENT:  Normal. Neck: Supple without bruits or JVD. Lungs:  Resp regular and unlabored, CTA. Excellent air movement. Heart: RRR no s3, s4, 2/6 systolic murmur heard best at apex. Abdomen: Soft, non-tender, non-distended, BS + x 4.  Extremities: No clubbing, cyanosis or edema.  DP/PT/Radials 2+ and equal bilaterally.  Accessory Clinical Findings  CBC  Basename 10/20/11 1031  WBC 9.5  NEUTROABS --  HGB 13.1  HCT 39.5  MCV 84.9  PLT 200   Basic Metabolic Panel  Basename 10/22/11 0640 10/21/11 0542  NA 140 140  K 3.9 4.4  CL 104 104  CO2 25 26  GLUCOSE 114* 112*  BUN 26* 22  CREATININE 1.19 1.13  CALCIUM 9.1 8.9  MG -- --  PHOS -- --   Cardiac Enzymes  Basename 10/21/11 0905  CKTOTAL 113  CKMB 2.7  CKMBINDEX --  TROPONINI <0.30   Lab Results  Component Value Date   CHOL 131 08/29/2011   HDL 46 08/29/2011   LDLCALC 70 08/29/2011   TRIG 75 08/29/2011   CHOLHDL 2.8 08/29/2011   TELE  RSR/SB  ASSESSMENT AND PLAN  1. Acute on chronic mixed systolic and diastolic heart failure: ECHO this admission (10/21/2011) showing EF of 40-45% with moderately to severely increased pulmonary pressures. Clinical improvement with diuresis resulting in resolution of SOB and reflective in I/Os and daily weights. On nitrate, ARB, ASA, diuretic; no BB due to low HR.  2. CAD with remote lateral MI: Consistent with 10/21/2011 ECHO showing akinesis of inferior myocardium. Known  occlusion of LCx by 2003 cath. Cardiac enzymes negative upon admission, no episodes of chest pain. On ASA, statin; no BB due to low HR.  3. Chronic mitral regurgitation: Consistent with exam.  4. History of non-adherence: Pt reports understanding of need to slowly work back up to normal exercise (3X/wk walking 1 mile and biking 30 minutes at [facility]) as well as need to adhere to a low sodium diet, though he notes that his food here has been very bland. He reports that he is adherent to his water pills.  5. HTN: Well controlled on existing regimen. Continue ARB, CCB.  6. Hyperlipidemia: LDL 70, continue statin.  7. Sinus bradycardia: No long pauses. Continue to hold off on BB.  8. Disposition: Possible d/c this afternoon.  Signed, Nicolasa Ducking NP Patient seen and examined and history  reviewed. Agree with above findings and plan. Excellent diuresis. Lungs now clear. No edema. Ambulating in hall. EF is unchanged by echo. MR is mild. One of the big problems is his sodium intake at home. Reviewed recommendations for sodium restriction.OK for discharge today on Lasix 40 mg daily.  Theron Arista JordanMD 10/22/2011 11:17 AM

## 2011-10-22 NOTE — Discharge Instructions (Signed)
***  PLEASE REMEMBER TO BRING ALL OF YOUR MEDICATIONS TO EACH OF YOUR FOLLOW-UP OFFICE VISITS.  ***DO NOT ADD SALT TO YOUR FOOD***

## 2011-10-23 ENCOUNTER — Telehealth: Payer: Self-pay | Admitting: Cardiology

## 2011-10-23 NOTE — Telephone Encounter (Signed)
Patient called, stating he went to er this past Sunday and was told not to take to take nebivolol.Stated he was not taking nebivolol.Advised to keep appointment with Norma Fredrickson NP 10/30/11.

## 2011-10-23 NOTE — Telephone Encounter (Signed)
New msg: Pt calling wanting to know medication, MD wants pt to stop taking. Please return pt call to discuss further.

## 2011-10-30 ENCOUNTER — Encounter: Payer: Medicare Other | Admitting: Nurse Practitioner

## 2011-11-06 ENCOUNTER — Encounter: Payer: Self-pay | Admitting: Nurse Practitioner

## 2011-11-06 ENCOUNTER — Ambulatory Visit (INDEPENDENT_AMBULATORY_CARE_PROVIDER_SITE_OTHER): Payer: Medicare Other | Admitting: Nurse Practitioner

## 2011-11-06 VITALS — BP 142/68 | HR 64 | Ht 70.5 in | Wt 191.0 lb

## 2011-11-06 DIAGNOSIS — I2510 Atherosclerotic heart disease of native coronary artery without angina pectoris: Secondary | ICD-10-CM | POA: Diagnosis not present

## 2011-11-06 DIAGNOSIS — I509 Heart failure, unspecified: Secondary | ICD-10-CM | POA: Diagnosis not present

## 2011-11-06 DIAGNOSIS — I5022 Chronic systolic (congestive) heart failure: Secondary | ICD-10-CM | POA: Diagnosis not present

## 2011-11-06 DIAGNOSIS — I5043 Acute on chronic combined systolic (congestive) and diastolic (congestive) heart failure: Secondary | ICD-10-CM | POA: Diagnosis not present

## 2011-11-06 LAB — BASIC METABOLIC PANEL
BUN: 25 mg/dL — ABNORMAL HIGH (ref 6–23)
CO2: 27 mEq/L (ref 19–32)
Calcium: 9.3 mg/dL (ref 8.4–10.5)
Chloride: 102 mEq/L (ref 96–112)
Creatinine, Ser: 1.3 mg/dL (ref 0.4–1.5)
GFR: 64.28 mL/min (ref >60.00)
Glucose, Bld: 116 mg/dL — ABNORMAL HIGH (ref 70–99)
Potassium: 4.7 mEq/L (ref 3.5–5.1)
Sodium: 140 mEq/L (ref 135–145)

## 2011-11-06 NOTE — Progress Notes (Signed)
Craig [name] Date of Birth: 05/18/1919 Medical Record #[id number]  History of Present Illness: Craig [name] is seen back today for a post hospital visit. He is seen for Dr. Swaziland. He has had a recent admission for acute heart failure. Felt to be salt related. Nebivolol was stopped. Not sure why but his heart rate is in the 60's here today. His Lasix was increased slightly. He had an echo during that admission showing an EF of 40 to 45% with increased PA pressures and trace MR.  He comes in today. He says he feels fine. He has backed off of the salt. No chest pain. Says his medicines are going ok but still not really able to tell me what he takes. Not lightheaded or dizzy. No swelling.   Current Outpatient Prescriptions on File Prior to Visit  Medication Sig Dispense Refill  . allopurinol (ZYLOPRIM) 300 MG tablet Take 350 mg by mouth daily.       Marland Kitchen amLODipine (NORVASC) 5 MG tablet Take 5 mg by mouth daily.      Marland Kitchen aspirin 325 MG tablet Take 325 mg by mouth daily.        . Coenzyme Q10 (COQ10 PO) Take 1 capsule by mouth daily.       . finasteride (PROSCAR) 5 MG tablet Take 5 mg by mouth as needed.        . furosemide (LASIX) 20 MG tablet Take 2 tablets (40 mg total) by mouth daily.  30 tablet  6  . isosorbide mononitrate (IMDUR) 30 MG 24 hr tablet Take 30 mg by mouth daily.        Marland Kitchen losartan (COZAAR) 100 MG tablet Take 100 mg by mouth daily.      . nitroGLYCERIN (NITROSTAT) 0.4 MG SL tablet Place 0.4 mg under the tongue every 5 (five) minutes as needed.      . potassium chloride SA (K-DUR,KLOR-CON) 10 MEQ tablet Take 1 tablet (10 mEq total) by mouth daily.  30 tablet  6  . rosuvastatin (CRESTOR) 10 MG tablet Take 10 mg by mouth at bedtime.        Allergies  Allergen Reactions  . Zocor (Simvastatin)     Past Medical History  Diagnosis Date  . Mitral insufficiency     CHRONIC  . CHF (congestive heart failure)     WITH MODERATE SYSTOLIC DYSFUNCTION  . Hypertension   .  Hyperlipidemia   . 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 Coronary artery disease     WITH REMOTE LATERAL MYOCARDIAL INFARCTION; s/p Myoview 2/13 with consistent findings of known LCX occlusion and moderate LV dysfunction; EF is 40%  . Myocardial infarct, old   . Raynaud's disease /phenomenon   . Hard of hearing     bilaterally  . Gout   . PTSD (post-traumatic stress disorder)     "being treated for it now but not a big degree"  . Asthma   . Noncompliance with medication regimen     Past Surgical History  Procedure Date  . Cardiac catheterization 2003    SHOWED OCCLUSION OF THE LEFT CIRCUMFLEX CORONARY ARTERY  . Appendectomy   . Prostatectomy   . Tonsillectomy   . Inguinal hernia repair     left    History  Smoking status  . Former Smoker -- 1.5 packs/day for 20 years  . Quit date: 12/12/1970  Smokeless tobacco  . Never Used    History  Alcohol Use  No    Family History  Problem Relation Age of Onset  . Heart attack Brother     Review of Systems: The review of systems is per the HPI.  All other systems were reviewed and are negative.  Physical Exam: BP 142/68  Pulse 64  Ht 5' 10.5" (1.791 m)  Wt 191 lb (86.637 kg)  BMI 27.02 kg/m2 Patient is very pleasant and in no acute distress. Skin is warm and dry. Color is normal.  HEENT is unremarkable. Normocephalic/atraumatic. PERRL. Sclera are nonicteric. Neck is supple. No masses. No JVD. Lungs are clear. Cardiac exam shows a regular rate and rhythm. Abdomen is soft. Extremities are without edema. Gait and ROM are intact. No gross neurologic deficits noted.  LABORATORY DATA: Repeat BMET is pending.   Assessment / Plan:

## 2011-11-06 NOTE — Assessment & Plan Note (Signed)
Patient presents post hospital for an acute exacerbation of his heart failure. Seems to be related to excessive salt and he says he is doing better with restricting his sodium. No change in his medicines today. Will recheck a BMET. He has an appointment in May to see Dr. Swaziland, which he will keep. Patient is agreeable to this plan and will call if any problems develop in the interim.

## 2011-11-06 NOTE — Patient Instructions (Addendum)
Stay off the salt  Stay on your current medicines  We need to recheck your potassium level today  Keep your appointment in May with Dr. Swaziland  Call the [facility] office at [phone] if you have any questions, problems or concerns.

## 2011-11-06 NOTE — Assessment & Plan Note (Signed)
He had his myoview back in February that was felt to be unchanged. He has a known total LCX occlusion and has known LV dysfunction. EF is 40%. Currently without chest pain.

## 2011-12-18 ENCOUNTER — Ambulatory Visit: Payer: Medicare Other | Admitting: Cardiology

## 2011-12-23 ENCOUNTER — Encounter: Payer: Self-pay | Admitting: Physician Assistant

## 2011-12-23 ENCOUNTER — Ambulatory Visit (INDEPENDENT_AMBULATORY_CARE_PROVIDER_SITE_OTHER): Payer: Medicare Other | Admitting: Physician Assistant

## 2011-12-23 VITALS — BP 120/78 | HR 57 | Ht 70.0 in | Wt 186.0 lb

## 2011-12-23 DIAGNOSIS — R5383 Other fatigue: Secondary | ICD-10-CM

## 2011-12-23 DIAGNOSIS — I5022 Chronic systolic (congestive) heart failure: Secondary | ICD-10-CM

## 2011-12-23 DIAGNOSIS — E785 Hyperlipidemia, unspecified: Secondary | ICD-10-CM

## 2011-12-23 DIAGNOSIS — I2510 Atherosclerotic heart disease of native coronary artery without angina pectoris: Secondary | ICD-10-CM

## 2011-12-23 DIAGNOSIS — I498 Other specified cardiac arrhythmias: Secondary | ICD-10-CM | POA: Diagnosis not present

## 2011-12-23 DIAGNOSIS — R5381 Other malaise: Secondary | ICD-10-CM | POA: Diagnosis not present

## 2011-12-23 DIAGNOSIS — I10 Essential (primary) hypertension: Secondary | ICD-10-CM

## 2011-12-23 DIAGNOSIS — I509 Heart failure, unspecified: Secondary | ICD-10-CM

## 2011-12-23 LAB — CBC WITH DIFFERENTIAL/PLATELET
Basophils Absolute: 0 K/uL (ref 0.0–0.1)
Basophils Relative: 0.5 % (ref 0.0–3.0)
Eosinophils Absolute: 0.3 K/uL (ref 0.0–0.7)
Eosinophils Relative: 3.9 % (ref 0.0–5.0)
HCT: 39.3 % (ref 39.0–52.0)
Hemoglobin: 12.6 g/dL — ABNORMAL LOW (ref 13.0–17.0)
Lymphocytes Relative: 23.8 % (ref 12.0–46.0)
Lymphs Abs: 2.1 K/uL (ref 0.7–4.0)
MCHC: 32.1 g/dL (ref 30.0–36.0)
MCV: 88.1 fl (ref 78.0–100.0)
Monocytes Absolute: 1.2 K/uL — ABNORMAL HIGH (ref 0.1–1.0)
Monocytes Relative: 13.3 % — ABNORMAL HIGH (ref 3.0–12.0)
Neutro Abs: 5.2 K/uL (ref 1.4–7.7)
Neutrophils Relative %: 58.5 % (ref 43.0–77.0)
Platelets: 188 K/uL (ref 150.0–400.0)
RBC: 4.46 Mil/uL (ref 4.22–5.81)
RDW: 16.6 % — ABNORMAL HIGH (ref 11.5–14.6)
WBC: 8.8 K/uL (ref 4.5–10.5)

## 2011-12-23 LAB — BASIC METABOLIC PANEL
BUN: 17 mg/dL (ref 6–23)
CO2: 28 mEq/L (ref 19–32)
Chloride: 103 mEq/L (ref 96–112)
Creatinine, Ser: 1.3 mg/dL (ref 0.4–1.5)

## 2011-12-23 MED ORDER — LOSARTAN POTASSIUM 50 MG PO TABS: 50.0000 mg | ORAL_TABLET | Freq: Every day | ORAL | Status: DC

## 2011-12-23 NOTE — Progress Notes (Signed)
[address] [place], [place]  [address] Phone: [phone] Fax:  [phone]  Date:  12/23/2011   Name:  Craig [name]   DOB:  1919/05/18   MRN:  [id number]  PCP:  [name], MD, MD  Primary Cardiologist:  Dr. Peter Swaziland  Primary Electrophysiologist:  None    History of Present Illness: Craig [name] is a 78 y.o. male who returns for follow up.  He has a history of CAD, systolic heart failure, ischemic cardiomyopathy, hypertension, hyperlipidemia, transient second degree heart block, Mobitz type I, asthma, gout.  LHC 2003:  proximal and mid LAD less than 20%, proximal circumflex occluded with left-left collaterals, proximal RCA 40%, inferior hypokinesis with EF 50% and 2+ MR.  Myoview 2/12 with inferior, inferolateral and lateral scar, apical thinning, no ischemia, EF 40%.  Echocardiogram 10/21/11: EF 40-45%, inferior AK, PASP 57, trace MR.  The patient was hospitalized in 10/2011 with a/c combined systolic and diastolic heart failure.  He was seen in followup by Mrs. Tyrone Sage 11/06/11.  He was stable at that time with plans to followup this month with Dr. Swaziland.  Patient notes "mood swings."  States he has good days and bad days.  He is currently off of losartan for unclear reasons.  He is a very active 78 year old.  He works out at [employer] several times a week.  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  He also does yard work in his neighborhood.  He typically does well with this without shortness of breath.  He denies exertional chest pain.  He denies orthopnea, PND or edema.  He denies syncope.  Some days he just feels exhausted.  Wt Readings from Last 3 Encounters:  12/23/11 186 lb (84.369 kg)  11/06/11 191 lb (86.637 kg)  10/22/11 182 lb 11.2 oz (82.872 kg)     Potassium  Date/Time Value Range Status  11/06/2011  2:19 PM 4.7  3.5-5.1 (mEq/L) Final     Creatinine, Ser  Date/Time Value Range Status  11/06/2011  2:19  PM 1.3  0.4-1.5 (mg/dL) Final     ALT  Date/Time Value Range Status  08/29/2011  4:21 PM 25  0-53 (U/L) Final     Hemoglobin  Date/Time Value Range Status  10/20/2011 10:31 AM 13.1  13.0-17.0 (g/dL) Final    Past Medical History  Diagnosis Date  . Mitral insufficiency     CHRONIC  . CHF (congestive heart failure)     WITH MODERATE SYSTOLIC DYSFUNCTION  . Hypertension   . Hyperlipidemia   . 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 Coronary artery disease     WITH REMOTE LATERAL MYOCARDIAL INFARCTION; s/p Myoview 2/13 with consistent findings of known LCX occlusion and moderate LV dysfunction; EF is 40%  . Myocardial infarct, old   . Raynaud's disease /phenomenon   . Hard of hearing     bilaterally  . Gout   . PTSD (post-traumatic stress disorder)     "being treated for it now but not a big degree"  . Asthma   . Noncompliance with medication regimen     Current Outpatient Prescriptions  Medication Sig Dispense Refill  . allopurinol (ZYLOPRIM) 300 MG tablet Take 350 mg by mouth daily.       Marland Kitchen amLODipine (NORVASC) 5 MG tablet Take 5 mg by mouth daily.      Marland Kitchen aspirin 325 MG tablet Take 325 mg by  mouth daily.        . Coenzyme Q10 (COQ10 PO) Take 1 capsule by mouth daily.       . finasteride (PROSCAR) 5 MG tablet Take 5 mg by mouth as needed.        . furosemide (LASIX) 20 MG tablet Take 2 tablets (40 mg total) by mouth daily.  30 tablet  6  . isosorbide mononitrate (IMDUR) 30 MG 24 hr tablet Take 30 mg by mouth daily.        . nitroGLYCERIN (NITROSTAT) 0.4 MG SL tablet Place 0.4 mg under the tongue every 5 (five) minutes as needed.      . rosuvastatin (CRESTOR) 10 MG tablet Take 10 mg by mouth at bedtime.        Allergies: Allergies  Allergen Reactions  . Zocor (Simvastatin)     History  Substance Use Topics  . Smoking status: Former Smoker -- 1.5 packs/day for 20 years    Quit date: 12/12/1970  . Smokeless tobacco: Never Used  . Alcohol Use: No     ROS:   Please see the history of present illness.   No history of snoring or daytime hypersomnolence.  He denies depressed mood.  No fevers, chills, cough, melena, hematochezia.  All other systems reviewed and negative.   PHYSICAL EXAM: VS:  BP 120/78  Pulse 57  Ht 5\' 10"  (1.778 m)  Wt 186 lb (84.369 kg)  BMI 26.69 kg/m2 Well nourished, well developed, in no acute distress HEENT: normal Neck: no JVD Endocrine: No thyromegaly Cardiac:  normal S1, S2; RRR; no murmur Lungs:  clear to auscultation bilaterally, no wheezing, rhonchi or rales Abd: soft, nontender, no hepatomegaly Ext: no edema Skin: warm and dry Neuro:  CNs 2-12 intact, no focal abnormalities noted  EKG:  Sinus bradycardia, heart rate 57, normal axis, first degree AV block, no change from prior tracing   ASSESSMENT AND PLAN:  1.  Fatigue Etiology not entirely clear.  He is bradycardic.  Question if this is playing a role.  Obtain 21 day event monitor.  Also, he is off of losartan.  Restart losartan 50 mg daily for his chronic systolic heart failure.  Check a basic metabolic panel, CBC and TSH.  Repeat a basic metabolic panel in one week.  He will keep his follow up with Dr. Swaziland next month.  2.  Chronic Systolic Congestive Heart Failure Secondary to Ischemic Cardiomyopathy Restart ARB. No beta blocker due to sinus brady. Currently volume is stable.   3.  Coronary artery disease No angina. Continue ASA and statin. At this point, I do not think his fatigue is explained by worsening CAD. He had a low risk Myoview last year. No further stress testing is warranted at this time.  If his monitor is ok and he continues to have the same symptoms, we may need to consider stress testing.  4.  Sinus Bradycardia Obtain event monitor.  5.  Hypertension Controlled.  Restart Losartan for chronic systolic CHF.  6.  Hyperlipidemia Continue current Rx.   Signed, Tereso Newcomer, PA-C  12:17 PM 12/23/2011

## 2011-12-23 NOTE — Patient Instructions (Addendum)
RESTART LOSARTAN 50 MG DAILY  TODAY BMET, CBC W/DIFF, TSH   REPEAT BMET  IN 1 WEEK  Your physician has recommended that you wear an event monitor DX 427.89. Event monitors are medical devices that record the heart's electrical activity. Doctors most often [place] these monitors to diagnose arrhythmias. Arrhythmias are problems with the speed or rhythm of the heartbeat. The monitor is a small, portable device. You can wear one while you do your normal daily activities. This is usually used to diagnose what is causing palpitations/syncope (passing out).  KEEP APPOINTMENT WITH DR. Swaziland 01/22/12

## 2012-01-01 ENCOUNTER — Telehealth: Payer: Self-pay

## 2012-01-01 ENCOUNTER — Encounter: Payer: Self-pay | Admitting: Cardiology

## 2012-01-01 ENCOUNTER — Encounter (INDEPENDENT_AMBULATORY_CARE_PROVIDER_SITE_OTHER): Payer: Medicare Other

## 2012-01-01 ENCOUNTER — Other Ambulatory Visit (INDEPENDENT_AMBULATORY_CARE_PROVIDER_SITE_OTHER): Payer: Medicare Other

## 2012-01-01 DIAGNOSIS — I509 Heart failure, unspecified: Secondary | ICD-10-CM

## 2012-01-01 DIAGNOSIS — I498 Other specified cardiac arrhythmias: Secondary | ICD-10-CM

## 2012-01-01 DIAGNOSIS — I4589 Other specified conduction disorders: Secondary | ICD-10-CM | POA: Diagnosis not present

## 2012-01-01 NOTE — Telephone Encounter (Signed)
Patient came to office to have monitor put on.Patient requesting last office note 12/23/11 to be sent to PCP Dr.Robert Reade.Copy of last office note faxed to Dr.Reade.

## 2012-01-02 LAB — BASIC METABOLIC PANEL
BUN: 25 mg/dL — ABNORMAL HIGH (ref 6–23)
Chloride: 107 mEq/L (ref 96–112)
Creatinine, Ser: 1.3 mg/dL (ref 0.4–1.5)
GFR: 65.38 mL/min (ref >60.00)
Glucose, Bld: 115 mg/dL — ABNORMAL HIGH (ref 70–99)
Potassium: 4 mEq/L (ref 3.5–5.1)

## 2012-01-03 DIAGNOSIS — R0602 Shortness of breath: Secondary | ICD-10-CM | POA: Diagnosis not present

## 2012-01-04 ENCOUNTER — Other Ambulatory Visit: Payer: Self-pay

## 2012-01-04 MED ORDER — ISOSORBIDE MONONITRATE ER 30 MG PO TB24: 30.0000 mg | ORAL_TABLET | Freq: Every day | ORAL | Status: DC

## 2012-01-17 DIAGNOSIS — I428 Other cardiomyopathies: Secondary | ICD-10-CM | POA: Diagnosis not present

## 2012-01-17 DIAGNOSIS — R5381 Other malaise: Secondary | ICD-10-CM | POA: Diagnosis not present

## 2012-01-17 DIAGNOSIS — I10 Essential (primary) hypertension: Secondary | ICD-10-CM | POA: Diagnosis not present

## 2012-01-22 ENCOUNTER — Ambulatory Visit: Payer: Medicare Other | Admitting: Cardiology

## 2012-01-29 ENCOUNTER — Ambulatory Visit (INDEPENDENT_AMBULATORY_CARE_PROVIDER_SITE_OTHER): Payer: Medicare Other | Admitting: Cardiology

## 2012-01-29 ENCOUNTER — Encounter: Payer: Self-pay | Admitting: Cardiology

## 2012-01-29 VITALS — BP 118/68 | HR 56 | Ht 70.0 in | Wt 188.0 lb

## 2012-01-29 DIAGNOSIS — I509 Heart failure, unspecified: Secondary | ICD-10-CM | POA: Diagnosis not present

## 2012-01-29 DIAGNOSIS — I10 Essential (primary) hypertension: Secondary | ICD-10-CM

## 2012-01-29 DIAGNOSIS — I441 Atrioventricular block, second degree: Secondary | ICD-10-CM

## 2012-01-29 DIAGNOSIS — I2510 Atherosclerotic heart disease of native coronary artery without angina pectoris: Secondary | ICD-10-CM | POA: Diagnosis not present

## 2012-01-29 NOTE — Assessment & Plan Note (Signed)
I suspect his weakness and lightheadedness is related to his AV block. He has Mobitz type I second-degree heart block (Wenckebach). He states he is still taking his beta blocker although his records indicate that this was stopped at a previous hospital stay. At any rate we will stop his beta blocker now. I will followup again in 6 weeks. We talked about the possibility of a pacemaker but he wants to defer that at this time.

## 2012-01-29 NOTE — Assessment & Plan Note (Signed)
He has no significant evidence of volume overload. He denies any dyspnea. He will continue on his angiotensin receptor blocker and Lasix.

## 2012-01-29 NOTE — Patient Instructions (Signed)
Stop taking Bystolic   Continue your other medication  I will see you again in 6 weeks

## 2012-01-29 NOTE — Assessment & Plan Note (Signed)
He has no symptoms of angina. We will continue with his other therapy but stopped his beta blocker as noted.

## 2012-01-29 NOTE — Progress Notes (Signed)
[name] Date of Birth: 09/09/1918 Medical Record #[id number]  History of Present Illness: Mr. Chaves is seen for followup today. He states he still has good days and bad days. On his bad times he gets a weak feeling and lightheaded. The last few days he has felt better. He is still very active. [place] Monday/Wednesday/[place] [facility] 45 minutes on the track and then rides a bike for 30 minutes. 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 He recently had an event monitor placed. This demonstrated some episodes of bradycardia with second-degree Mobitz type I heart block. He did have some episodes of 2 :1 block with heart rate down in the 30 range. He has had no syncope. He states he is still taking bystolic. He denies any palpitations or increase shortness of breath.  Current Outpatient Prescriptions on File Prior to Visit  Medication Sig Dispense Refill  . allopurinol (ZYLOPRIM) 300 MG tablet Take 350 mg by mouth daily.       Marland Kitchen amLODipine (NORVASC) 5 MG tablet Take 5 mg by mouth daily.      Marland Kitchen aspirin 325 MG tablet Take 325 mg by mouth daily.        . Coenzyme Q10 (COQ10 PO) Take 1 capsule by mouth daily.       . finasteride (PROSCAR) 5 MG tablet Take 5 mg by mouth as needed.        . furosemide (LASIX) 20 MG tablet Take 2 tablets (40 mg total) by mouth daily.  30 tablet  6  . isosorbide mononitrate (IMDUR) 30 MG 24 hr tablet Take 1 tablet (30 mg total) by mouth daily.  90 tablet  11  . KLOR-CON M10 10 MEQ tablet 1 tab daily      . losartan (COZAAR) 50 MG tablet Take 1 tablet (50 mg total) by mouth daily.  30 tablet  11  . nitroGLYCERIN (NITROSTAT) 0.4 MG SL tablet Place 0.4 mg under the tongue every 5 (five) minutes as needed.      . rosuvastatin (CRESTOR) 10 MG tablet Take 10 mg by mouth at bedtime.        Allergies  Allergen Reactions  . Zocor (Simvastatin)     Past Medical History  Diagnosis Date  . Mitral insufficiency     CHRONIC  .  CHF (congestive heart failure)     WITH MODERATE SYSTOLIC DYSFUNCTION  . Hypertension   . Hyperlipidemia   . 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 Coronary artery disease     WITH REMOTE LATERAL MYOCARDIAL INFARCTION; s/p Myoview 2/13 with consistent findings of known LCX occlusion and moderate LV dysfunction; EF is 40%  . Myocardial infarct, old   . Raynaud's disease /phenomenon   . Hard of hearing     bilaterally  . Gout   . PTSD (post-traumatic stress disorder)     "being treated for it now but not a big degree"  . Asthma   . Noncompliance with medication regimen     Past Surgical History  Procedure Date  . Cardiac catheterization 2003    SHOWED OCCLUSION OF THE LEFT CIRCUMFLEX CORONARY ARTERY  . Appendectomy   . Prostatectomy   . Tonsillectomy   . Inguinal hernia repair     left    History  Smoking status  . Former Smoker -- 1.5 packs/day for 20 years  . Quit date:  12/12/1970  Smokeless tobacco  . Never Used    History  Alcohol Use No    Family History  Problem Relation Age of Onset  . Heart attack Brother     Review of Systems: The review of systems is positive for lightheadedness and weakness..  All other systems were reviewed and are negative.  Physical Exam: BP 118/68  Pulse 56  Ht 5\' 10"  (1.778 m)  Wt 188 lb (85.276 kg)  BMI 26.98 kg/m2 He is an elderly white male, pleasant, in no distress. He is balding. He wears a hearing aid. Pupils are equal round and reactive. Sclera are clear. Oropharynx is clear. Neck is without JVD, adenopathy, thyromegaly, or bruits. Lungs are clear to auscultation and percussion. Cardiac exam reveals a regular rate and rhythm with a grade 2/6 systolic murmur at the apex. Abdomen is soft and nontender without organomegaly or masses. Extremities are without edema or cyanosis. Pulses are 2+. Skin is warm and dry. Neurologic exam reveals no focal abnormalities. Mood is appropriate. LABORATORY DATA:  Event monitor is  as noted above. Assessment / Plan:

## 2012-02-23 ENCOUNTER — Other Ambulatory Visit: Payer: Self-pay | Admitting: Cardiology

## 2012-03-16 ENCOUNTER — Other Ambulatory Visit: Payer: Self-pay | Admitting: Cardiology

## 2012-03-16 MED ORDER — LOSARTAN POTASSIUM 50 MG PO TABS: 50.0000 mg | ORAL_TABLET | Freq: Every day | ORAL | Status: DC

## 2012-03-21 ENCOUNTER — Other Ambulatory Visit (HOSPITAL_COMMUNITY): Payer: Self-pay | Admitting: Nurse Practitioner

## 2012-03-30 DIAGNOSIS — M109 Gout, unspecified: Secondary | ICD-10-CM | POA: Diagnosis not present

## 2012-03-30 DIAGNOSIS — Z1331 Encounter for screening for depression: Secondary | ICD-10-CM | POA: Diagnosis not present

## 2012-04-08 ENCOUNTER — Emergency Department (HOSPITAL_COMMUNITY): Payer: Medicare Other

## 2012-04-08 ENCOUNTER — Emergency Department (HOSPITAL_COMMUNITY)
Admission: EM | Admit: 2012-04-08 | Discharge: 2012-04-08 | Disposition: A | Discharge status: Home / Self Care | Payer: Medicare Other | Attending: Emergency Medicine | Admitting: Emergency Medicine

## 2012-04-08 ENCOUNTER — Encounter (HOSPITAL_COMMUNITY): Payer: Self-pay | Admitting: *Deleted

## 2012-04-08 DIAGNOSIS — I252 Old myocardial infarction: Secondary | ICD-10-CM | POA: Insufficient documentation

## 2012-04-08 DIAGNOSIS — R079 Chest pain, unspecified: Secondary | ICD-10-CM | POA: Diagnosis not present

## 2012-04-08 DIAGNOSIS — R0789 Other chest pain: Secondary | ICD-10-CM | POA: Diagnosis present

## 2012-04-08 DIAGNOSIS — I10 Essential (primary) hypertension: Secondary | ICD-10-CM | POA: Insufficient documentation

## 2012-04-08 DIAGNOSIS — I2510 Atherosclerotic heart disease of native coronary artery without angina pectoris: Secondary | ICD-10-CM | POA: Insufficient documentation

## 2012-04-08 DIAGNOSIS — Z79899 Other long term (current) drug therapy: Secondary | ICD-10-CM | POA: Diagnosis not present

## 2012-04-08 DIAGNOSIS — R5381 Other malaise: Secondary | ICD-10-CM | POA: Insufficient documentation

## 2012-04-08 DIAGNOSIS — R0602 Shortness of breath: Secondary | ICD-10-CM | POA: Diagnosis not present

## 2012-04-08 LAB — CBC WITH DIFFERENTIAL/PLATELET
Basophils Absolute: 0 10*3/uL (ref 0.0–0.1)
Basophils Relative: 0 % (ref 0–1)
Eosinophils Absolute: 0.4 10*3/uL (ref 0.0–0.7)
Eosinophils Relative: 4 % (ref 0–5)
HCT: 38.5 % — ABNORMAL LOW (ref 39.0–52.0)
Hemoglobin: 13.2 g/dL (ref 13.0–17.0)
Lymphocytes Relative: 25 % (ref 12–46)
Lymphs Abs: 2.1 10*3/uL (ref 0.7–4.0)
MCH: 28.6 pg (ref 26.0–34.0)
MCHC: 34.3 g/dL (ref 30.0–36.0)
MCV: 83.5 fL (ref 78.0–100.0)
Monocytes Absolute: 0.8 10*3/uL (ref 0.1–1.0)
Monocytes Relative: 9 % (ref 3–12)
Neutro Abs: 5 10*3/uL (ref 1.7–7.7)
Neutrophils Relative %: 61 % (ref 43–77)
Platelets: 203 10*3/uL (ref 150–400)
RBC: 4.61 MIL/uL (ref 4.22–5.81)
RDW: 15.5 % (ref 11.5–15.5)
WBC: 8.3 10*3/uL (ref 4.0–10.5)

## 2012-04-08 LAB — POCT I-STAT TROPONIN I
Troponin i, poc: 0.02 ng/mL (ref 0.00–0.08)
Troponin i, poc: 0.02 ng/mL (ref 0.00–0.08)

## 2012-04-08 LAB — TROPONIN I: Troponin I: <0.3 ng/mL (ref <0.30)

## 2012-04-08 LAB — BASIC METABOLIC PANEL
BUN: 20 mg/dL (ref 6–23)
CO2: 27 mEq/L (ref 19–32)
Calcium: 9.7 mg/dL (ref 8.4–10.5)
Chloride: 102 mEq/L (ref 96–112)
Creatinine, Ser: 1.54 mg/dL — ABNORMAL HIGH (ref 0.50–1.35)
GFR calc Af Amer: 44 mL/min — ABNORMAL LOW (ref >90)
GFR calc non Af Amer: 38 mL/min — ABNORMAL LOW (ref >90)
Glucose, Bld: 113 mg/dL — ABNORMAL HIGH (ref 70–99)
Potassium: 4.2 mEq/L (ref 3.5–5.1)
Sodium: 139 mEq/L (ref 135–145)

## 2012-04-08 MED ORDER — SODIUM CHLORIDE 0.9 % IV BOLUS (SEPSIS): 1000.0000 mL | INTRAVENOUS | Status: DC

## 2012-04-08 NOTE — ED Provider Notes (Signed)
History     CSN: [id number]  Arrival date & time 04/08/12  1622   First MD Initiated Contact with Patient 04/08/12 1841      Chief Complaint  Patient presents with  . Weakness  . Chest Pain    (Consider location/radiation/quality/duration/timing/severity/associated sxs/prior treatment) Patient is a 78 y.o. male presenting with weakness and chest pain. The history is provided by the patient.  Weakness Primary symptoms do not include headaches, fever, nausea or vomiting.  Additional symptoms do not include weakness.  Chest Pain The chest pain began 6 - 12 hours ago. Chest pain occurs intermittently. The chest pain is resolved. Associated with: unknown. The severity of the pain is mild. Quality: discomfort. The pain does not radiate. Primary symptoms include shortness of breath (mild). Pertinent negatives for primary symptoms include no fever, no cough, no abdominal pain, no nausea and no vomiting.  Pertinent negatives for associated symptoms include no numbness and no weakness. He tried nothing for the symptoms. Risk factors: known CAD.     Past Medical History  Diagnosis Date  . Mitral insufficiency     CHRONIC  . CHF (congestive heart failure)     WITH MODERATE SYSTOLIC DYSFUNCTION  . Hypertension   . Hyperlipidemia   . 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 Coronary artery disease     WITH REMOTE LATERAL MYOCARDIAL INFARCTION; s/p Myoview 2/13 with consistent findings of known LCX occlusion and moderate LV dysfunction; EF is 40%  . Myocardial infarct, old   . Raynaud's disease /phenomenon   . Hard of hearing     bilaterally  . Gout   . PTSD (post-traumatic stress disorder)     "being treated for it now but not a big degree"  . Asthma   . Noncompliance with medication regimen     Past Surgical History  Procedure Date  . Cardiac catheterization 2003    SHOWED OCCLUSION OF THE LEFT CIRCUMFLEX CORONARY ARTERY  . Appendectomy   . Prostatectomy   . Tonsillectomy     . Inguinal hernia repair     left    Family History  Problem Relation Age of Onset  . Heart attack Brother     History  Substance Use Topics  . Smoking status: Former Smoker -- 1.5 packs/day for 20 years    Quit date: 12/12/1970  . Smokeless tobacco: Never Used  . Alcohol Use: No      Review of Systems  Constitutional: Negative for fever.  HENT: Negative for rhinorrhea, drooling and neck pain.   Eyes: Negative for pain.  Respiratory: Positive for shortness of breath (mild). Negative for cough.   Cardiovascular: Positive for chest pain. Negative for leg swelling.  Gastrointestinal: Negative for nausea, vomiting, abdominal pain and diarrhea.  Genitourinary: Negative for dysuria and hematuria.  Musculoskeletal: Negative for gait problem.  Skin: Negative for color change.  Neurological: Negative for weakness, numbness and headaches.  Hematological: Negative for adenopathy.  Psychiatric/Behavioral: Negative for behavioral problems.  All other systems reviewed and are negative.    Allergies  Zocor  Home Medications   Current Outpatient Rx  Name Route Sig Dispense Refill  . ALLOPURINOL 300 MG PO TABS Oral Take 300 mg by mouth daily.     Marland Kitchen AMLODIPINE BESYLATE 5 MG PO TABS Oral Take 5 mg by mouth daily.    . ASPIRIN 325 MG PO TABS Oral Take 325 mg by mouth daily.      Marland Kitchen BYSTOLIC 10  MG PO TABS Oral Take 10 mg by mouth daily.    . COQ10 PO Oral Take 1 capsule by mouth daily.     Marland Kitchen FINASTERIDE 5 MG PO TABS Oral Take 5 mg by mouth daily.     . FUROSEMIDE 20 MG PO TABS Oral Take 20 mg by mouth daily.    . ISOSORBIDE MONONITRATE ER 30 MG PO TB24 Oral Take 1 tablet (30 mg total) by mouth daily. 90 tablet 11  . LOSARTAN POTASSIUM 50 MG PO TABS Oral Take 1 tablet (50 mg total) by mouth daily. 90 tablet 3  . NITROGLYCERIN 0.4 MG SL SUBL Sublingual Place 0.4 mg under the tongue every 5 (five) minutes as needed. For chest pain    . ROSUVASTATIN CALCIUM 10 MG PO TABS Oral Take 10 mg  by mouth daily.       BP 117/67  Pulse 71  Temp 98 F (36.7 C) (Oral)  Resp 13  SpO2 99%  Physical Exam  Nursing note and vitals reviewed. Constitutional: He is oriented to person, place, and time. He appears well-developed and well-nourished.  HENT:  Head: Normocephalic and atraumatic.  Right Ear: External ear normal.  Left Ear: External ear normal.  Nose: Nose normal.  Mouth/Throat: Oropharynx is clear and moist. No oropharyngeal exudate.  Eyes: Conjunctivae and EOM are normal. Pupils are equal, round, and reactive to light.  Neck: Normal range of motion. Neck supple.  Cardiovascular: Normal rate, regular rhythm, normal heart sounds and intact distal pulses.  Exam reveals no gallop and no friction rub.   No murmur heard. Pulmonary/Chest: Effort normal and breath sounds normal. No respiratory distress. He has no wheezes.  Abdominal: Soft. Bowel sounds are normal. He exhibits no distension. There is no tenderness. There is no rebound and no guarding.  Musculoskeletal: Normal range of motion. He exhibits no edema and no tenderness.  Neurological: He is alert and oriented to person, place, and time.  Skin: Skin is warm and dry.  Psychiatric: He has a normal mood and affect. His behavior is normal.    ED Course  Procedures (including critical care time)  Labs Reviewed  CBC WITH DIFFERENTIAL - Abnormal; Notable for the following:    HCT 38.5 (*)     All other components within normal limits  BASIC METABOLIC PANEL - Abnormal; Notable for the following:    Glucose, Bld 113 (*)     Creatinine, Ser 1.54 (*)     GFR calc non Af Amer 38 (*)     GFR calc Af Amer 44 (*)     All other components within normal limits  POCT I-STAT TROPONIN I  TROPONIN I  POCT I-STAT TROPONIN I   Dg Chest 2 View  04/08/2012  *RADIOLOGY REPORT*  Clinical Data: Chest pain with shortness of breath.  CHEST - 2 VIEW  Comparison: 10/20/2011  Findings: The lungs are clear without focal infiltrate, edema,  pneumothorax or pleural effusion.  Lung bases are better aerated than on the previous study. Cardiopericardial silhouette is at upper limits of normal for size. Interstitial markings are diffusely coarsened with chronic features. Imaged bony structures of the thorax are intact.  IMPRESSION: Underlying chronic interstitial coarsening.  No acute cardiopulmonary findings.   Original Report Authenticated By: ERIC A. MANSELL, M.D.      1. Chest discomfort      Date: 04/08/2012  Rate: 79  Rhythm: sinus rhythm w/ marked sinus arrhythmia  QRS Axis: normal  Intervals: PR prolonged and QT  prolonged  ST/T Wave abnormalities: normal  Conduction Disutrbances:first-degree A-V block   Narrative Interpretation: No ST or T wave changes cw ischemia  Old EKG Reviewed: changes noted    MDM  12:31 AM 78 y.o. 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 Pt states he has been asx since, but notes that he did not feel right this morning. Pt AFVSS here, appears well on exam, would like to go home. Discussed case w/ attending, will get delta trop.    12:31 AM: Delta trop neg. Pt continues to appear well and would like to leave. I have discussed the diagnosis/risks/treatment options with the patient and believe the pt to be eligible for discharge home to follow-up with his pcp or cardiologist in 1-2 days. We also discussed returning to the ED immediately if new or worsening sx occur. We discussed the sx which are most concerning (e.g., further chest discomfort) that necessitate immediate return. Any new prescriptions provided to the patient are listed below.  New Prescriptions   No medications on file   Clinical Impression 1. Chest discomfort         Purvis Sheffield, MD 04/09/12 [phone]

## 2012-04-08 NOTE — ED Notes (Signed)
Pt still refuses IV and fluids, will stay for repeat card enzymes.

## 2012-04-08 NOTE — ED Notes (Signed)
Pt refusing IV and fluids at this time, EDP aware and to bedside.

## 2012-04-08 NOTE — ED Notes (Signed)
Pt states today while at work he had right sided chest discomfort, describes it as a dull discomfort with weakness and sob. Pt with extensive cardiac hx.

## 2012-04-13 DIAGNOSIS — E785 Hyperlipidemia, unspecified: Secondary | ICD-10-CM | POA: Diagnosis not present

## 2012-04-13 DIAGNOSIS — I10 Essential (primary) hypertension: Secondary | ICD-10-CM | POA: Diagnosis not present

## 2012-04-13 DIAGNOSIS — I441 Atrioventricular block, second degree: Secondary | ICD-10-CM | POA: Diagnosis not present

## 2012-04-16 NOTE — ED Provider Notes (Signed)
I saw and evaluated the patient, reviewed the resident's note and I agree with the findings and plan.  I have personally reviewed the patient's EKG.  78 year old male with some vague chest discomfort earlier today. He currently has no complaints. He is well-appearing on exam. Lungs are clear in no acute respiratory distress. Heart is regular without murmur. Abdomen is benign. Patient is actually very sharp for his age. He does not appear to be confused. Workup fairly unremarkable. Patient will likely go home. Did not find basis for further evaluation or admission at this time. Return precautions were discussed. Outpatient followup as needed.  Raeford Razor, MD 04/16/12 [phone]

## 2012-04-21 ENCOUNTER — Encounter: Payer: Self-pay | Admitting: Nurse Practitioner

## 2012-04-21 ENCOUNTER — Encounter (INDEPENDENT_AMBULATORY_CARE_PROVIDER_SITE_OTHER): Payer: Medicare Other

## 2012-04-21 ENCOUNTER — Ambulatory Visit (INDEPENDENT_AMBULATORY_CARE_PROVIDER_SITE_OTHER): Payer: Medicare Other | Admitting: Nurse Practitioner

## 2012-04-21 VITALS — BP 148/90 | HR 68 | Ht 70.0 in | Wt 180.2 lb

## 2012-04-21 DIAGNOSIS — I498 Other specified cardiac arrhythmias: Secondary | ICD-10-CM | POA: Diagnosis not present

## 2012-04-21 DIAGNOSIS — R001 Bradycardia, unspecified: Secondary | ICD-10-CM

## 2012-04-21 NOTE — Progress Notes (Signed)
Craig [name] Date of Birth: 1918/10/09 Medical Record #[id number]  History of Present Illness: Craig [name] is seen today for a follow up visit. He is seen for Dr. Swaziland. He is 78 years old. He has known bradycardia with second degree Mobitz I on prior event monitor. He did have some 2:1 block with heart rates down in the 30's. This was on Bystolic. He has declined pacemaker implant at his last visit back in June with Dr. Swaziland. His other problems include CAD, HTN, HLD, chronic mitral insufficiency and gout. He has had some issues with noncompliance. He has most recently been to the ER with some atypical chest pain. Has also recently seen his PCP for complaints of fatigue who requested follow up back here.  He comes in today. He is here alone. He has had some issues with fatigue per the records from his PCP. Says it will last for several hours. Does not happen every day. Has actually felt pretty well for the past few days. He was in the ER earlier this month for chest pain and had a negative evaluation. He was to have stopped his Bystolic back in June but his records from the ER and his PCP still list it. He says he is not taking it any longer. We called the drug store and he has not had it filled since June.  No syncope. He says he is ready for a pacemaker now.   Current Outpatient Prescriptions on File Prior to Visit  Medication Sig Dispense Refill  . allopurinol (ZYLOPRIM) 300 MG tablet Take 300 mg by mouth daily.       Marland Kitchen amLODipine (NORVASC) 5 MG tablet Take 5 mg by mouth daily.      Marland Kitchen aspirin 325 MG tablet Take 325 mg by mouth daily.        . Coenzyme Q10 (COQ10 PO) Take 1 capsule by mouth daily.       . finasteride (PROSCAR) 5 MG tablet Take 5 mg by mouth daily.       . furosemide (LASIX) 20 MG tablet Take 20 mg by mouth daily.      . isosorbide mononitrate (IMDUR) 30 MG 24 hr tablet Take 1 tablet (30 mg total) by mouth daily.  90 tablet  11  . losartan (COZAAR) 50 MG tablet Take 1 tablet  (50 mg total) by mouth daily.  90 tablet  3  . nitroGLYCERIN (NITROSTAT) 0.4 MG SL tablet Place 0.4 mg under the tongue every 5 (five) minutes as needed. For chest pain      . rosuvastatin (CRESTOR) 10 MG tablet Take 10 mg by mouth daily.         Allergies  Allergen Reactions  . Zocor (Simvastatin)     Past Medical History  Diagnosis Date  . Mitral insufficiency     CHRONIC  . CHF (congestive heart failure)     WITH MODERATE SYSTOLIC DYSFUNCTION  . Hypertension   . Hyperlipidemia   . 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 Coronary artery disease     WITH REMOTE LATERAL MYOCARDIAL INFARCTION; s/p Myoview 2/13 with consistent findings of known LCX occlusion and moderate LV dysfunction; EF is 40%  . Myocardial infarct, old   . Raynaud's disease /phenomenon   . Hard of hearing     bilaterally  . Gout   . PTSD (post-traumatic stress disorder)     "being treated for it now but not a big degree"  .  Asthma   . Noncompliance with medication regimen     Past Surgical History  Procedure Date  . Cardiac catheterization 2003    SHOWED OCCLUSION OF THE LEFT CIRCUMFLEX CORONARY ARTERY  . Appendectomy   . Prostatectomy   . Tonsillectomy   . Inguinal hernia repair     left    History  Smoking status  . Former Smoker -- 1.5 packs/day for 20 years  . Quit date: 12/12/1970  Smokeless tobacco  . Never Used    History  Alcohol Use No    Family History  Problem Relation Age of Onset  . Heart attack Brother     Review of Systems: The review of systems is per the HPI.  All other systems were reviewed and are negative.  Physical Exam: BP 148/90  Pulse 68  Ht 5\' 10"  (1.778 m)  Wt 180 lb 4 oz (81.761 kg)  BMI 25.86 kg/m2 Patient is very pleasant and in no acute distress. Skin is warm and dry. Color is normal.  HEENT is unremarkable. Normocephalic/atraumatic. PERRL. Sclera are nonicteric. Neck is supple. No masses. No JVD. Lungs are clear. Cardiac exam shows a regular rate  and rhythm. Abdomen is soft. Extremities are without edema. Gait and ROM are intact. No gross neurologic deficits noted.   LABORATORY DATA: EKG today shows sinus rhythm.   Lab Results  Component Value Date   WBC 8.3 04/08/2012   HGB 13.2 04/08/2012   HCT 38.5* 04/08/2012   PLT 203 04/08/2012   GLUCOSE 113* 04/08/2012   CHOL 131 08/29/2011   TRIG 75 08/29/2011   HDL 46 08/29/2011   LDLCALC 70 08/29/2011   ALT 25 08/29/2011   AST 15 08/29/2011   NA 139 04/08/2012   K 4.2 04/08/2012   CL 102 04/08/2012   CREATININE 1.54* 04/08/2012   BUN 20 04/08/2012   CO2 27 04/08/2012   TSH 6.92* 12/23/2011   INR 1.08 08/29/2011   HGBA1C 6.5* 08/29/2011    Assessment / Plan:  1. Fatigue/weakness - most like related to his heart block. PTVP had been discussed in the past and he declined. He says he is now ready to proceed if it is needed. He did have Wenckebach with bradycardia on his event monitor and I think it was while he was on Bystolic. We did call his pharmacy and he has not had this filled since June. He does not appear to be taking this any longer. I have reviewed his event monitor and last OV note with Dr. Swaziland to Dr. Graciela Husbands who was in the office today. It is his opinion that we need to obtain a 48 hour Holter to see if we still have rhythm and rate issues now off of beta blocker therapy. His monitor was placed today. Further disposition to follow.   2. CAD - has had some atypical chest pain with a negative Myoview earlier this year. Will continue with medical management.  Patient is agreeable to this plan and will call if any problems develop in the interim.

## 2012-04-21 NOTE — Patient Instructions (Addendum)
Stay on this current list of medicines  Continue to STAY OFF the Bystolic  We need to put a monitor on you for 48 hours to relook at your heart rate and rhythm  Call the [facility] office at [phone] if you have any questions, problems or concerns.

## 2012-04-24 ENCOUNTER — Encounter: Payer: Self-pay | Admitting: Cardiology

## 2012-04-24 ENCOUNTER — Telehealth: Payer: Self-pay

## 2012-04-24 ENCOUNTER — Encounter: Payer: Self-pay | Admitting: Internal Medicine

## 2012-04-24 NOTE — Telephone Encounter (Signed)
Patient called no answer.Left message to call office Monday 04/27/12 for monitor results.

## 2012-04-27 ENCOUNTER — Telehealth: Payer: Self-pay

## 2012-04-27 NOTE — Telephone Encounter (Signed)
Patient walked in office,was told monitor revealed slow heart beat at times,occassional PVC,PAC.Patient was told did not need a pacemaker.Patient stated he continues to have " blah " feelings,feelings in which he has no energy.States he has these feelings 2 to 3 times a week.Patient was told Dr.Jordan not in office today will let him know 04/28/12 and call him back.

## 2012-04-28 ENCOUNTER — Telehealth: Payer: Self-pay | Admitting: Cardiology

## 2012-04-28 NOTE — Telephone Encounter (Signed)
Pt rtning someone's call to get results

## 2012-04-29 NOTE — Telephone Encounter (Signed)
Patient called, Dr.Jordan was told patient has days in which he has no energy.Dr.Jordan advised to continue same medication.Advised to keep appointment 05/21/12 with Dr.Jordan.

## 2012-04-30 DIAGNOSIS — H905 Unspecified sensorineural hearing loss: Secondary | ICD-10-CM | POA: Diagnosis not present

## 2012-04-30 DIAGNOSIS — R5381 Other malaise: Secondary | ICD-10-CM | POA: Diagnosis not present

## 2012-04-30 DIAGNOSIS — B9789 Other viral agents as the cause of diseases classified elsewhere: Secondary | ICD-10-CM | POA: Diagnosis not present

## 2012-04-30 DIAGNOSIS — R5383 Other fatigue: Secondary | ICD-10-CM | POA: Diagnosis not present

## 2012-04-30 DIAGNOSIS — H6120 Impacted cerumen, unspecified ear: Secondary | ICD-10-CM | POA: Diagnosis not present

## 2012-05-21 ENCOUNTER — Encounter: Payer: Self-pay | Admitting: Cardiology

## 2012-05-21 ENCOUNTER — Ambulatory Visit (INDEPENDENT_AMBULATORY_CARE_PROVIDER_SITE_OTHER): Payer: Medicare Other | Admitting: Cardiology

## 2012-05-21 VITALS — BP 158/78 | HR 65 | Ht 70.0 in | Wt 183.4 lb

## 2012-05-21 DIAGNOSIS — I509 Heart failure, unspecified: Secondary | ICD-10-CM | POA: Diagnosis not present

## 2012-05-21 DIAGNOSIS — I2510 Atherosclerotic heart disease of native coronary artery without angina pectoris: Secondary | ICD-10-CM

## 2012-05-21 DIAGNOSIS — I441 Atrioventricular block, second degree: Secondary | ICD-10-CM

## 2012-05-21 DIAGNOSIS — I340 Nonrheumatic mitral (valve) insufficiency: Secondary | ICD-10-CM

## 2012-05-21 DIAGNOSIS — I059 Rheumatic mitral valve disease, unspecified: Secondary | ICD-10-CM

## 2012-05-21 DIAGNOSIS — I10 Essential (primary) hypertension: Secondary | ICD-10-CM

## 2012-05-21 NOTE — Patient Instructions (Signed)
Monitor your blood pressure at home ([facility])  Continue your current therapy and activity.  I will see you back in 3 months.

## 2012-05-21 NOTE — Progress Notes (Signed)
Craig [name] Date of Birth: 1918-09-14 Medical Record #[id number]  History of Present Illness: Craig [name] is seen for followup today. He states he still has his good days and bad days. Some days he feels tired. He denies any significant dizziness or lightheadedness. He's had no syncope. Most days he does fairly well. He has been under some increased stress recently. His wife who is now 76 years old has been ill. He did wear a 24-hour Holter monitor. This demonstrated normal sinus rhythm with occasional PVCs and PACs. He had some Mobitz type I second-degree AV block without any significant pauses or sustained bradycardia.  Current Outpatient Prescriptions on File Prior to Visit  Medication Sig Dispense Refill  . allopurinol (ZYLOPRIM) 300 MG tablet Take 300 mg by mouth daily.       Marland Kitchen amLODipine (NORVASC) 5 MG tablet Take 5 mg by mouth daily.      Marland Kitchen aspirin 325 MG tablet Take 325 mg by mouth daily.        . Coenzyme Q10 (COQ10 PO) Take 1 capsule by mouth daily.       . finasteride (PROSCAR) 5 MG tablet Take 5 mg by mouth daily.       . furosemide (LASIX) 20 MG tablet Take 20 mg by mouth daily.      . isosorbide mononitrate (IMDUR) 30 MG 24 hr tablet Take 1 tablet (30 mg total) by mouth daily.  90 tablet  11  . losartan (COZAAR) 50 MG tablet Take 1 tablet (50 mg total) by mouth daily.  90 tablet  3  . nitroGLYCERIN (NITROSTAT) 0.4 MG SL tablet Place 0.4 mg under the tongue every 5 (five) minutes as needed. For chest pain      . rosuvastatin (CRESTOR) 10 MG tablet Take 10 mg by mouth daily.         Allergies  Allergen Reactions  . Zocor (Simvastatin)     Past Medical History  Diagnosis Date  . Mitral insufficiency     CHRONIC  . CHF (congestive heart failure)     WITH MODERATE SYSTOLIC DYSFUNCTION  . Hypertension   . Hyperlipidemia   . 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 Coronary artery disease     WITH REMOTE LATERAL MYOCARDIAL INFARCTION; s/p Myoview 2/13 with  consistent findings of known LCX occlusion and moderate LV dysfunction; EF is 40%  . Myocardial infarct, old   . Raynaud's disease /phenomenon   . Hard of hearing     bilaterally  . Gout   . PTSD (post-traumatic stress disorder)     "being treated for it now but not a big degree"  . Asthma   . Noncompliance with medication regimen     Past Surgical History  Procedure Date  . Cardiac catheterization 2003    SHOWED OCCLUSION OF THE LEFT CIRCUMFLEX CORONARY ARTERY  . Appendectomy   . Prostatectomy   . Tonsillectomy   . Inguinal hernia repair     left    History  Smoking status  . Former Smoker -- 1.5 packs/day for 20 years  . Quit date: 12/12/1970  Smokeless tobacco  . Never Used    History  Alcohol Use No    Family History  Problem Relation Age of Onset  . Heart attack Brother     Review of Systems: As noted in history of present illness  All other systems were reviewed and are negative.  Physical Exam: BP 158/78  Pulse 65  Ht 5\' 10"  (1.778 m)  Wt 183 lb 6.4 oz (83.19 kg)  BMI 26.32 kg/m2  SpO2 96% He is an elderly white male, pleasant, in no distress. He is balding. He wears a hearing aid. Pupils are equal round and reactive. Sclera are clear. Oropharynx is clear. Neck is without JVD, adenopathy, thyromegaly, or bruits. Lungs are clear to auscultation and percussion. Cardiac exam reveals a regular rate and rhythm with a grade 2/6 systolic murmur at the apex. Abdomen is soft and nontender without organomegaly or masses. Extremities are without edema or cyanosis. Pulses are 2+. Skin is warm and dry. Neurologic exam reveals no focal abnormalities. Mood is appropriate. LABORATORY DATA: Holter monitor as noted above. Assessment / Plan:  1. Coronary disease with chronic occlusion of the left circumflex coronary. Asymptomatic.  2. Mobitz type I second-degree AV block. Heart rate improved off of bystolic. I'm not sure that we can really correlate his symptoms of  fatigue with any bradycardia. I do not see an indication for pacemaker at this time. We will continue with his current therapy and followup in 3 months.  3. Congestive heart failure chronic systolic, clinically well compensated. Ejection fraction of 40%  4. Mitral insufficiency.  5. Hypertension.

## 2012-07-07 DIAGNOSIS — Z0000 Encounter for general adult medical examination without abnormal findings: Secondary | ICD-10-CM | POA: Diagnosis not present

## 2012-07-07 DIAGNOSIS — I10 Essential (primary) hypertension: Secondary | ICD-10-CM | POA: Diagnosis not present

## 2012-07-15 DIAGNOSIS — N400 Enlarged prostate without lower urinary tract symptoms: Secondary | ICD-10-CM | POA: Diagnosis not present

## 2012-08-17 ENCOUNTER — Emergency Department (HOSPITAL_COMMUNITY): Payer: Medicare Other

## 2012-08-17 ENCOUNTER — Emergency Department (HOSPITAL_COMMUNITY)
Admission: EM | Admit: 2012-08-17 | Discharge: 2012-08-18 | Disposition: A | Discharge status: Home / Self Care | Payer: Medicare Other | Attending: Emergency Medicine | Admitting: Emergency Medicine

## 2012-08-17 ENCOUNTER — Encounter (HOSPITAL_COMMUNITY): Payer: Self-pay | Admitting: *Deleted

## 2012-08-17 DIAGNOSIS — J40 Bronchitis, not specified as acute or chronic: Secondary | ICD-10-CM | POA: Diagnosis not present

## 2012-08-17 DIAGNOSIS — R5381 Other malaise: Secondary | ICD-10-CM | POA: Diagnosis not present

## 2012-08-17 DIAGNOSIS — I10 Essential (primary) hypertension: Secondary | ICD-10-CM | POA: Insufficient documentation

## 2012-08-17 DIAGNOSIS — R5383 Other fatigue: Secondary | ICD-10-CM | POA: Insufficient documentation

## 2012-08-17 DIAGNOSIS — Z8659 Personal history of other mental and behavioral disorders: Secondary | ICD-10-CM | POA: Insufficient documentation

## 2012-08-17 DIAGNOSIS — I2510 Atherosclerotic heart disease of native coronary artery without angina pectoris: Secondary | ICD-10-CM | POA: Insufficient documentation

## 2012-08-17 DIAGNOSIS — Z8709 Personal history of other diseases of the respiratory system: Secondary | ICD-10-CM | POA: Diagnosis not present

## 2012-08-17 DIAGNOSIS — I252 Old myocardial infarction: Secondary | ICD-10-CM | POA: Diagnosis not present

## 2012-08-17 DIAGNOSIS — Z8739 Personal history of other diseases of the musculoskeletal system and connective tissue: Secondary | ICD-10-CM | POA: Insufficient documentation

## 2012-08-17 DIAGNOSIS — I059 Rheumatic mitral valve disease, unspecified: Secondary | ICD-10-CM | POA: Diagnosis not present

## 2012-08-17 DIAGNOSIS — E785 Hyperlipidemia, unspecified: Secondary | ICD-10-CM | POA: Diagnosis not present

## 2012-08-17 DIAGNOSIS — R531 Weakness: Secondary | ICD-10-CM

## 2012-08-17 DIAGNOSIS — Z8679 Personal history of other diseases of the circulatory system: Secondary | ICD-10-CM | POA: Diagnosis not present

## 2012-08-17 DIAGNOSIS — Z87891 Personal history of nicotine dependence: Secondary | ICD-10-CM | POA: Insufficient documentation

## 2012-08-17 DIAGNOSIS — I509 Heart failure, unspecified: Secondary | ICD-10-CM | POA: Diagnosis not present

## 2012-08-17 DIAGNOSIS — Z7982 Long term (current) use of aspirin: Secondary | ICD-10-CM | POA: Diagnosis not present

## 2012-08-17 DIAGNOSIS — Z862 Personal history of diseases of the blood and blood-forming organs and certain disorders involving the immune mechanism: Secondary | ICD-10-CM | POA: Insufficient documentation

## 2012-08-17 DIAGNOSIS — J438 Other emphysema: Secondary | ICD-10-CM | POA: Diagnosis not present

## 2012-08-17 LAB — BASIC METABOLIC PANEL
BUN: 19 mg/dL (ref 6–23)
Chloride: 104 mEq/L (ref 96–112)
Glucose, Bld: 89 mg/dL (ref 70–99)
Potassium: 3.8 mEq/L (ref 3.5–5.1)

## 2012-08-17 LAB — CBC
HCT: 41.6 % (ref 39.0–52.0)
Hemoglobin: 13.9 g/dL (ref 13.0–17.0)
MCV: 84.7 fL (ref 78.0–100.0)
WBC: 7.7 10*3/uL (ref 4.0–10.5)

## 2012-08-17 NOTE — ED Notes (Signed)
PT to ED with hx of chf and reynauds disease to ED c/o feeling weak for several days that the "just can't shake".  Denies chest pain, nausea or cough.  No LE edema noted.

## 2012-08-18 LAB — URINE MICROSCOPIC-ADD ON

## 2012-08-18 LAB — URINALYSIS, ROUTINE W REFLEX MICROSCOPIC
Glucose, UA: NEGATIVE mg/dL
Protein, ur: 100 mg/dL — AB
Specific Gravity, Urine: 1.021 (ref 1.005–1.030)
pH: 6.5 (ref 5.0–8.0)

## 2012-08-18 NOTE — ED Provider Notes (Signed)
History     CSN: [id number]  Arrival date & time 08/17/12  1514   First MD Initiated Contact with Patient 08/17/12 2021      Chief Complaint  Patient presents with  . Fatigue    (Consider location/radiation/quality/duration/timing/severity/associated sxs/prior treatment) HPI Comments: Chest tube is present with complaints of weakness. Patient reports that he has been feeling weak for the last several days. He is feeling better now. He has not had any chest pain, nausea or vomiting. He did have a slight cough over the last couple of days and now shortness of breath. He has not had fever.   Past Medical History  Diagnosis Date  . Mitral insufficiency     CHRONIC  . Hypertension   . Hyperlipidemia   . 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 Coronary artery disease     WITH REMOTE LATERAL MYOCARDIAL INFARCTION; s/p Myoview 2/13 with consistent findings of known LCX occlusion and moderate LV dysfunction; EF is 40%  . Myocardial infarct, old   . Raynaud's disease /phenomenon   . Hard of hearing     bilaterally  . Gout   . PTSD (post-traumatic stress disorder)     "being treated for it now but not a big degree"  . Asthma   . Noncompliance with medication regimen   . CHF (congestive heart failure)     WITH MODERATE SYSTOLIC DYSFUNCTION    Past Surgical History  Procedure Date  . Prostatectomy   . Tonsillectomy   . Inguinal hernia repair     left  . Appendectomy   . Cardiac catheterization 2003    SHOWED OCCLUSION OF THE LEFT CIRCUMFLEX CORONARY ARTERY    Family History  Problem Relation Age of Onset  . Heart attack Brother     History  Substance Use Topics  . Smoking status: Former Smoker -- 1.5 packs/day for 20 years    Quit date: 12/12/1970  . Smokeless tobacco: Never Used  . Alcohol Use: No      Review of Systems  Constitutional: Positive for fatigue. Negative for fever.  Respiratory: Positive for cough.   All other systems reviewed and are  negative.    Allergies  Zocor  Home Medications   Current Outpatient Rx  Name  Route  Sig  Dispense  Refill  . ALLOPURINOL 300 MG PO TABS   Oral   Take 300 mg by mouth daily.          Marland Kitchen AMLODIPINE BESYLATE 5 MG PO TABS   Oral   Take 5 mg by mouth daily.         . ASPIRIN 325 MG PO TABS   Oral   Take 325 mg by mouth daily.           . COQ10 PO   Oral   Take 1 capsule by mouth daily.          Marland Kitchen FINASTERIDE 5 MG PO TABS   Oral   Take 5 mg by mouth daily.          . FUROSEMIDE 20 MG PO TABS   Oral   Take 20 mg by mouth daily.         . ISOSORBIDE MONONITRATE ER 30 MG PO TB24   Oral   Take 1 tablet (30 mg total) by mouth daily.   90 tablet   11   . LOSARTAN POTASSIUM 50 MG PO TABS   Oral   Take  1 tablet (50 mg total) by mouth daily.   90 tablet   3   . NITROGLYCERIN 0.4 MG SL SUBL   Sublingual   Place 0.4 mg under the tongue every 5 (five) minutes as needed. For chest pain         . ROSUVASTATIN CALCIUM 10 MG PO TABS   Oral   Take 10 mg by mouth daily.            BP 151/86  Pulse 54  Temp 98.1 F (36.7 C) (Oral)  Resp 15  Ht 5' 10.5" (1.791 m)  Wt 185 lb (83.915 kg)  BMI 26.17 kg/m2  SpO2 100%  Physical Exam  Constitutional: He is oriented to person, place, and time. He appears well-developed and well-nourished. No distress.  HENT:  Head: Normocephalic and atraumatic.  Right Ear: Hearing normal.  Nose: Nose normal.  Mouth/Throat: Oropharynx is clear and moist and mucous membranes are normal.  Eyes: Conjunctivae normal and EOM are normal. Pupils are equal, round, and reactive to light.  Neck: Normal range of motion. Neck supple.  Cardiovascular: Normal rate, regular rhythm, S1 normal and S2 normal.  Exam reveals no gallop and no friction rub.   No murmur heard. Pulmonary/Chest: Effort normal and breath sounds normal. No respiratory distress. He exhibits no tenderness.  Abdominal: Soft. Normal appearance and bowel sounds are  normal. There is no hepatosplenomegaly. There is no tenderness. There is no rebound, no guarding, no tenderness at McBurney's point and negative Murphy's sign. No hernia.  Musculoskeletal: Normal range of motion.  Neurological: He is alert and oriented to person, place, and time. He has normal strength. No cranial nerve deficit or sensory deficit. Coordination normal. GCS eye subscore is 4. GCS verbal subscore is 5. GCS motor subscore is 6.  Skin: Skin is warm, dry and intact. No rash noted. No cyanosis.  Psychiatric: He has a normal mood and affect. His speech is normal and behavior is normal. Thought content normal.    ED Course  Procedures (including critical care time)  Labs Reviewed  BASIC METABOLIC PANEL - Abnormal; Notable for the following:    GFR calc non Af Amer 62 (*)     GFR calc Af Amer 72 (*)     All other components within normal limits  URINALYSIS, ROUTINE W REFLEX MICROSCOPIC - Abnormal; Notable for the following:    Protein, ur 100 (*)     Leukocytes, UA TRACE (*)     All other components within normal limits  URINE MICROSCOPIC-ADD ON - Abnormal; Notable for the following:    Squamous Epithelial / LPF FEW (*)     Bacteria, UA FEW (*)     All other components within normal limits  CBC  POCT I-STAT TROPONIN I  URINE CULTURE   Dg Chest 2 View  08/17/2012  *RADIOLOGY REPORT*  Clinical Data: Worsening fatigue, former smoker, hypertension  CHEST - 2 VIEW  Comparison: 04/08/2012  Findings: Normal heart size, mediastinal contours, and pulmonary vascularity. Emphysematous and bronchitic changes compatible with COPD. Questionable right upper lobe infiltrate. Probable bilateral nipple shadows, seen on earlier exam of 01/18/2004. Remaining lungs clear. No pleural effusion or pneumothorax Bones unremarkable.  IMPRESSION: COPD. No acute abnormalities.   Original Report Authenticated By: Ulyses Southward, M.D.      Diagnosis: Generalized weakness    MDM  Patient presents to the ER  for evaluation of generalized weakness. He does report, however, that he is feeling better at time of evaluation. He  reports that he works out 3 days a week at [employer] and also rides a bike. Family thinks he has been overdoing it. His workup has been entirely unremarkable. He was reassured, will be discharged home. He was counseled to work out only in moderation.       Gilda Crease, MD 08/18/12 [phone]

## 2012-08-19 LAB — URINE CULTURE: Colony Count: NO GROWTH

## 2012-08-21 DIAGNOSIS — E785 Hyperlipidemia, unspecified: Secondary | ICD-10-CM | POA: Diagnosis not present

## 2012-08-21 DIAGNOSIS — I2510 Atherosclerotic heart disease of native coronary artery without angina pectoris: Secondary | ICD-10-CM | POA: Diagnosis not present

## 2012-08-21 DIAGNOSIS — R5381 Other malaise: Secondary | ICD-10-CM | POA: Diagnosis not present

## 2012-08-21 DIAGNOSIS — I10 Essential (primary) hypertension: Secondary | ICD-10-CM | POA: Diagnosis not present

## 2012-08-27 ENCOUNTER — Ambulatory Visit: Payer: Medicare Other | Admitting: Cardiology

## 2012-09-08 ENCOUNTER — Other Ambulatory Visit: Payer: Self-pay | Admitting: *Deleted

## 2012-09-08 MED ORDER — ROSUVASTATIN CALCIUM 10 MG PO TABS: 10.0000 mg | ORAL_TABLET | Freq: Every day | ORAL | Status: DC

## 2012-09-25 ENCOUNTER — Other Ambulatory Visit: Payer: Self-pay

## 2012-09-25 ENCOUNTER — Telehealth: Payer: Self-pay | Admitting: Cardiology

## 2012-09-25 MED ORDER — AMLODIPINE BESYLATE 5 MG PO TABS: 5.0000 mg | ORAL_TABLET | Freq: Every day | ORAL | Status: DC

## 2012-09-25 NOTE — Telephone Encounter (Signed)
Pt called yelling that he hasn't gotten refill of amlodipine [facility], can we please call in today?

## 2012-10-01 ENCOUNTER — Ambulatory Visit (INDEPENDENT_AMBULATORY_CARE_PROVIDER_SITE_OTHER): Payer: Medicare Other | Admitting: Cardiology

## 2012-10-01 ENCOUNTER — Encounter: Payer: Self-pay | Admitting: Cardiology

## 2012-10-01 VITALS — BP 152/94 | HR 65 | Ht 70.5 in | Wt 185.1 lb

## 2012-10-01 DIAGNOSIS — I2510 Atherosclerotic heart disease of native coronary artery without angina pectoris: Secondary | ICD-10-CM

## 2012-10-01 DIAGNOSIS — I7300 Raynaud's syndrome without gangrene: Secondary | ICD-10-CM

## 2012-10-01 DIAGNOSIS — I441 Atrioventricular block, second degree: Secondary | ICD-10-CM | POA: Diagnosis not present

## 2012-10-01 DIAGNOSIS — I509 Heart failure, unspecified: Secondary | ICD-10-CM

## 2012-10-01 NOTE — Patient Instructions (Signed)
Get a BP monitor and monitor your BP and pulse when you don't feel well.  I will see you in 6 months.

## 2012-10-01 NOTE — Progress Notes (Signed)
[name] Date of Birth: May 11, 1919 Medical Record #[id number]  History of Present Illness: Craig [name] is seen for followup today. He is doing well. His energy level is fair. He has had no dizziness or lightheadedness. His breathing is doing well and he has no chest pain. Occasionally he feels "blah". He just doesn't have much energy and is unable to do much that day. Other days he is able to do his normal activities. He did run out of his medications for a couple of days and this may have been associated with one of these episodes. He continues to experience Raynaud's phenomenon with his fingers turning purple in exposure to cold.  Current Outpatient Prescriptions on File Prior to Visit  Medication Sig Dispense Refill  . allopurinol (ZYLOPRIM) 300 MG tablet Take 300 mg by mouth daily.       Marland Kitchen amLODipine (NORVASC) 5 MG tablet Take 1 tablet (5 mg total) by mouth daily.  90 tablet  1  . aspirin 325 MG tablet Take 325 mg by mouth daily.        . Coenzyme Q10 (COQ10 PO) Take 1 capsule by mouth daily.       . finasteride (PROSCAR) 5 MG tablet Take 5 mg by mouth daily.       . furosemide (LASIX) 20 MG tablet Take 20 mg by mouth daily.      . isosorbide mononitrate (IMDUR) 30 MG 24 hr tablet Take 1 tablet (30 mg total) by mouth daily.  90 tablet  11  . losartan (COZAAR) 50 MG tablet Take 1 tablet (50 mg total) by mouth daily.  90 tablet  3  . nitroGLYCERIN (NITROSTAT) 0.4 MG SL tablet Place 0.4 mg under the tongue every 5 (five) minutes as needed. For chest pain      . rosuvastatin (CRESTOR) 10 MG tablet Take 1 tablet (10 mg total) by mouth daily.  30 tablet  1   No current facility-administered medications on file prior to visit.    Allergies  Allergen Reactions  . Zocor (Simvastatin)     Past Medical History  Diagnosis Date  . Mitral insufficiency     CHRONIC  . Hypertension   . Hyperlipidemia   . 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 Coronary artery disease     WITH  REMOTE LATERAL MYOCARDIAL INFARCTION; s/p Myoview 2/13 with consistent findings of known LCX occlusion and moderate LV dysfunction; EF is 40%  . Myocardial infarct, old   . Raynaud's disease /phenomenon   . Hard of hearing     bilaterally  . Gout   . PTSD (post-traumatic stress disorder)     "being treated for it now but not a big degree"  . Asthma   . Noncompliance with medication regimen   . CHF (congestive heart failure)     WITH MODERATE SYSTOLIC DYSFUNCTION    Past Surgical History  Procedure Laterality Date  . Prostatectomy    . Tonsillectomy    . Inguinal hernia repair      left  . Appendectomy    . Cardiac catheterization  2003    SHOWED OCCLUSION OF THE LEFT CIRCUMFLEX CORONARY ARTERY    History  Smoking status  . Former Smoker -- 1.50 packs/day for 20 years  . Quit date: 12/12/1970  Smokeless tobacco  . Never Used    History  Alcohol Use No    Family History  Problem Relation Age of Onset  .  Heart attack Brother     Review of Systems: As noted in history of present illness  All other systems were reviewed and are negative.  Physical Exam: BP 152/94  Pulse 65  Ht 5' 10.5" (1.791 m)  Wt 185 lb 1.9 oz (83.97 kg)  BMI 26.18 kg/m2  SpO2 97% He is an elderly white male, pleasant, in no distress. He is balding. He wears a hearing aid. Pupils are equal round and reactive. Sclera are clear. Oropharynx is clear. Neck is without JVD, adenopathy, thyromegaly, or bruits. Lungs are clear to auscultation and percussion. Cardiac exam reveals a regular rate and rhythm with a grade 2/6 systolic murmur at the apex. Abdomen is soft and nontender without organomegaly or masses. Extremities are without edema or cyanosis. Pulses are 2+. Skin is warm and dry. Neurologic exam reveals no focal abnormalities. Mood is appropriate. LABORATORY DATA: Lab Results  Component Value Date   WBC 7.7 08/17/2012   HGB 13.9 08/17/2012   HCT 41.6 08/17/2012   PLT 169 08/17/2012    GLUCOSE 89 08/17/2012   CHOL 131 08/29/2011   TRIG 75 08/29/2011   HDL 46 08/29/2011   LDLCALC 70 08/29/2011   ALT 25 08/29/2011   AST 15 08/29/2011   NA 141 08/17/2012   K 3.8 08/17/2012   CL 104 08/17/2012   CREATININE 1.02 08/17/2012   BUN 19 08/17/2012   CO2 26 08/17/2012   TSH 6.92* 12/23/2011   INR 1.08 08/29/2011   HGBA1C 6.5* 08/29/2011    Assessment / Plan:  1. Coronary disease with chronic occlusion of the left circumflex coronary. Asymptomatic.  2. Mobitz type I second-degree AV block. Heart rate improved off of bystolic. No indication for pacemaker at this time. I recommended he get a home blood pressure monitor so we can monitor his blood pressure and pulse rate and see if this correlates with the days that he has less energy.  3. Congestive heart failure chronic systolic, clinically well compensated. Ejection fraction of 40%  4. Mitral insufficiency.  5. Hypertension. Blood pressure is mildly elevated. At his age I am not inclined to try and achieve tight blood pressure control and we will continue with his current medications.

## 2012-10-13 ENCOUNTER — Other Ambulatory Visit (HOSPITAL_COMMUNITY): Payer: Self-pay | Admitting: Nurse Practitioner

## 2012-10-13 ENCOUNTER — Other Ambulatory Visit: Payer: Self-pay | Admitting: Cardiology

## 2012-10-17 ENCOUNTER — Observation Stay (HOSPITAL_COMMUNITY)
Admission: EM | Admit: 2012-10-17 | Discharge: 2012-10-18 | Disposition: A | Discharge status: Home / Self Care | Payer: Medicare Other | Attending: Internal Medicine | Admitting: Internal Medicine

## 2012-10-17 ENCOUNTER — Emergency Department (HOSPITAL_COMMUNITY): Payer: Medicare Other

## 2012-10-17 ENCOUNTER — Encounter (HOSPITAL_COMMUNITY): Payer: Self-pay | Admitting: Nurse Practitioner

## 2012-10-17 ENCOUNTER — Other Ambulatory Visit: Payer: Self-pay

## 2012-10-17 DIAGNOSIS — R079 Chest pain, unspecified: Secondary | ICD-10-CM | POA: Diagnosis not present

## 2012-10-17 DIAGNOSIS — I441 Atrioventricular block, second degree: Secondary | ICD-10-CM | POA: Insufficient documentation

## 2012-10-17 DIAGNOSIS — J45909 Unspecified asthma, uncomplicated: Secondary | ICD-10-CM | POA: Insufficient documentation

## 2012-10-17 DIAGNOSIS — I509 Heart failure, unspecified: Secondary | ICD-10-CM | POA: Diagnosis not present

## 2012-10-17 DIAGNOSIS — F4310 Post-traumatic stress disorder, unspecified: Secondary | ICD-10-CM | POA: Diagnosis not present

## 2012-10-17 DIAGNOSIS — I4892 Unspecified atrial flutter: Secondary | ICD-10-CM | POA: Diagnosis not present

## 2012-10-17 DIAGNOSIS — Z8673 Personal history of transient ischemic attack (TIA), and cerebral infarction without residual deficits: Secondary | ICD-10-CM | POA: Diagnosis not present

## 2012-10-17 DIAGNOSIS — R0789 Other chest pain: Secondary | ICD-10-CM | POA: Diagnosis not present

## 2012-10-17 DIAGNOSIS — I2510 Atherosclerotic heart disease of native coronary artery without angina pectoris: Secondary | ICD-10-CM | POA: Insufficient documentation

## 2012-10-17 DIAGNOSIS — H9190 Unspecified hearing loss, unspecified ear: Secondary | ICD-10-CM | POA: Diagnosis not present

## 2012-10-17 DIAGNOSIS — I5022 Chronic systolic (congestive) heart failure: Secondary | ICD-10-CM | POA: Diagnosis not present

## 2012-10-17 DIAGNOSIS — I10 Essential (primary) hypertension: Secondary | ICD-10-CM | POA: Diagnosis present

## 2012-10-17 DIAGNOSIS — I059 Rheumatic mitral valve disease, unspecified: Secondary | ICD-10-CM | POA: Diagnosis not present

## 2012-10-17 DIAGNOSIS — J40 Bronchitis, not specified as acute or chronic: Secondary | ICD-10-CM | POA: Diagnosis not present

## 2012-10-17 DIAGNOSIS — Z7982 Long term (current) use of aspirin: Secondary | ICD-10-CM | POA: Insufficient documentation

## 2012-10-17 DIAGNOSIS — M109 Gout, unspecified: Secondary | ICD-10-CM | POA: Diagnosis not present

## 2012-10-17 DIAGNOSIS — I7300 Raynaud's syndrome without gangrene: Secondary | ICD-10-CM | POA: Diagnosis not present

## 2012-10-17 DIAGNOSIS — E785 Hyperlipidemia, unspecified: Secondary | ICD-10-CM | POA: Insufficient documentation

## 2012-10-17 DIAGNOSIS — I252 Old myocardial infarction: Secondary | ICD-10-CM | POA: Diagnosis not present

## 2012-10-17 HISTORY — DX: Chronic systolic (congestive) heart failure: I50.22

## 2012-10-17 LAB — TROPONIN I: Troponin I: <0.3 ng/mL (ref <0.30)

## 2012-10-17 LAB — BASIC METABOLIC PANEL
CO2: 27 mEq/L (ref 19–32)
Calcium: 9.5 mg/dL (ref 8.4–10.5)
GFR calc non Af Amer: 55 mL/min — ABNORMAL LOW (ref >90)
Potassium: 4.6 mEq/L (ref 3.5–5.1)
Sodium: 138 mEq/L (ref 135–145)

## 2012-10-17 LAB — CBC
Hemoglobin: 13.5 g/dL (ref 13.0–17.0)
MCH: 29 pg (ref 26.0–34.0)
Platelets: 189 10*3/uL (ref 150–400)
RBC: 4.66 MIL/uL (ref 4.22–5.81)

## 2012-10-17 LAB — POCT I-STAT TROPONIN I: Troponin i, poc: 0.02 ng/mL (ref 0.00–0.08)

## 2012-10-17 LAB — PRO B NATRIURETIC PEPTIDE: Pro B Natriuretic peptide (BNP): 1098 pg/mL — ABNORMAL HIGH (ref 0–450)

## 2012-10-17 MED ORDER — POTASSIUM CHLORIDE CRYS ER 20 MEQ PO TBCR
EXTENDED_RELEASE_TABLET | ORAL | Status: AC
  Filled 2012-10-17: qty 3

## 2012-10-17 MED ORDER — FINASTERIDE 5 MG PO TABS
5.0000 mg | ORAL_TABLET | Freq: Every day | ORAL | Status: DC
  Filled 2012-10-17: qty 1

## 2012-10-17 MED ORDER — SODIUM CHLORIDE 0.9 % IJ SOLN
3.0000 mL | Freq: Two times a day (BID) | INTRAMUSCULAR | Status: DC
  Administered 2012-10-17: 3 mL via INTRAVENOUS

## 2012-10-17 MED ORDER — ACETAMINOPHEN 325 MG PO TABS: 650.0000 mg | ORAL_TABLET | ORAL | Status: DC | PRN

## 2012-10-17 MED ORDER — ISOSORBIDE MONONITRATE ER 60 MG PO TB24
60.0000 mg | ORAL_TABLET | Freq: Every day | ORAL | Status: DC
  Filled 2012-10-17: qty 1

## 2012-10-17 MED ORDER — NITROGLYCERIN 0.4 MG SL SUBL: 0.4000 mg | SUBLINGUAL_TABLET | SUBLINGUAL | Status: DC | PRN

## 2012-10-17 MED ORDER — SODIUM CHLORIDE 0.9 % IV SOLN: 250.0000 mL | INTRAVENOUS | Status: DC | PRN

## 2012-10-17 MED ORDER — APIXABAN 2.5 MG PO TABS
2.5000 mg | ORAL_TABLET | Freq: Two times a day (BID) | ORAL | Status: DC
  Administered 2012-10-17: 2.5 mg via ORAL
  Filled 2012-10-17 (×3): qty 1

## 2012-10-17 MED ORDER — AMLODIPINE BESYLATE 5 MG PO TABS
5.0000 mg | ORAL_TABLET | Freq: Every day | ORAL | Status: DC
Start: 2012-10-18 — End: 2012-10-18
  Filled 2012-10-17: qty 1

## 2012-10-17 MED ORDER — ONDANSETRON HCL 4 MG/2ML IJ SOLN: 4.0000 mg | Freq: Four times a day (QID) | INTRAMUSCULAR | Status: DC | PRN

## 2012-10-17 MED ORDER — ALLOPURINOL 300 MG PO TABS
300.0000 mg | ORAL_TABLET | Freq: Every day | ORAL | Status: DC
  Filled 2012-10-17: qty 1

## 2012-10-17 MED ORDER — ATORVASTATIN CALCIUM 20 MG PO TABS
20.0000 mg | ORAL_TABLET | Freq: Every day | ORAL | Status: DC
  Filled 2012-10-17: qty 1

## 2012-10-17 MED ORDER — LOSARTAN POTASSIUM 50 MG PO TABS
50.0000 mg | ORAL_TABLET | Freq: Every day | ORAL | Status: DC
  Filled 2012-10-17: qty 1

## 2012-10-17 MED ORDER — ASPIRIN EC 81 MG PO TBEC
81.0000 mg | DELAYED_RELEASE_TABLET | Freq: Every day | ORAL | Status: DC
  Filled 2012-10-17: qty 1

## 2012-10-17 MED ORDER — SODIUM CHLORIDE 0.9 % IJ SOLN: 3.0000 mL | INTRAMUSCULAR | Status: DC | PRN

## 2012-10-17 MED ORDER — FUROSEMIDE 40 MG PO TABS
40.0000 mg | ORAL_TABLET | Freq: Every morning | ORAL | Status: DC
  Filled 2012-10-17: qty 1

## 2012-10-17 MED ORDER — APIXABAN 2.5 MG PO TABS: 2.5000 mg | ORAL_TABLET | Freq: Two times a day (BID) | ORAL | Status: DC

## 2012-10-17 NOTE — H&P (Signed)
Patient ID: Craig [name] MRN: [id number], DOB/AGE: [birth year]   Admit date: 10/17/2012   Primary Physician: [name], MD Primary Cardiologist: P. [place], MD  Pt. Profile:  79 year old male with history of coronary artery disease who presents following a brief episode of chest pain earlier this morning.  Problem List  Past Medical History  Diagnosis Date  . Mitral insufficiency     CHRONIC  . Hypertension   . Hyperlipidemia   . 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 Coronary artery disease     a. Remote lateral infarct;  b. 09/2011 MV: large dense inferolat/lat scar - ? very slight peri-infarcrt isch.  No signif change c/w 09/2010 scan.  . Myocardial infarct, old   . Raynaud's disease /phenomenon   . Hard of hearing     bilaterally  . Gout   . PTSD (post-traumatic stress disorder)     "being treated for it now but not a big degree"  . Asthma   . Noncompliance with medication regimen   . Chronic systolic CHF (congestive heart failure)     a. 10/2011 Echo: EF 40-45%, inf AK, PASP .    Past Surgical History  Procedure Laterality Date  . Prostatectomy    . Tonsillectomy    . Inguinal hernia repair      left  . Appendectomy    . Cardiac catheterization  2003    SHOWED OCCLUSION OF THE LEFT CIRCUMFLEX CORONARY ARTERY    Allergies  Allergies  Allergen Reactions  . Zocor (Simvastatin) Other (See Comments)    Unknown reaction per pt   HPI  79 y/o male with h/o remote lateral infarct.  He most recently had a relatively nonischemic stress test in February 2013. For the most part, he does quite well without chest pain or dyspnea. He does have intermittent fatigue and he mentioned this to Dr. Swaziland when he was seen in February of 2014. He also has a history of variable degree heart block but no history of presyncope or syncope and has been managed conservatively without indication for pacemaker. Patient was in his usual state of health this morning  when he had sudden onset of mild chest pressure while lying in bed. There were no associated symptoms. Symptoms lasted approximately 1 minute and resolve spontaneously. Due to chest discomfort, he decided to come into the ED for evaluation. He's had no recurrence of chest discomfort and initial cardiac markers are negative. His ECG shows atrial flutter at a rate of 62 with no acute ST or T changes. There is a new rhythm for him and he is currently asymptomatic.  Home Medications  Prior to Admission medications   Medication Sig Start Date End Date Taking? Authorizing Provider  allopurinol (ZYLOPRIM) 300 MG tablet Take 300 mg by mouth daily.    Yes Historical Provider, MD  amLODipine (NORVASC) 5 MG tablet Take 1 tablet (5 mg total) by mouth daily. 09/25/12  Yes Peter M Swaziland, MD  aspirin 325 MG tablet Take 325 mg by mouth daily.     Yes Historical Provider, MD  Coenzyme Q10 (COQ10 PO) Take 1 capsule by mouth daily.    Yes Historical Provider, MD  finasteride (PROSCAR) 5 MG tablet Take 5 mg by mouth daily.    Yes Historical Provider, MD  furosemide (LASIX) 20 MG tablet Take 40 mg by mouth every morning.   Yes Historical Provider, MD  isosorbide mononitrate (IMDUR) 30 MG 24 hr tablet Take  1 tablet (30 mg total) by mouth daily. 01/04/12  Yes Peter M [place], MD  losartan (COZAAR) 50 MG tablet Take 1 tablet (50 mg total) by mouth daily. 03/16/12 03/16/13 Yes Peter M [place], MD  nitroGLYCERIN (NITROSTAT) 0.4 MG SL tablet Place 0.4 mg under the tongue every 5 (five) minutes as needed. x3 doses as needed for chest pain   Yes Rhonda G Barrett, PA-C  rosuvastatin (CRESTOR) 10 MG tablet Take 10 mg by mouth every morning. 09/08/12  Yes Peter M Sw[place]iland, MD   Family History  Family History  Problem Relation Age of Onset  . Heart attack Brother   . Alcohol abuse Father     died in his late 25's  . Other Mother     died in her late 16's - old age.   Social History  History   Social History  . Marital Status:  Married    Spouse Name: N/A    Number of Children: 8  . Years of Education: N/A   Occupational History  . Chief Technology Officer    Social History Main Topics  . Smoking status: Former Smoker -- 1.50 packs/day for 20 years    Quit date: 12/12/1970  . Smokeless tobacco: Never Used  . Alcohol Use: No  . Drug Use: No  . Sexually Active: No   Other Topics Concern  . Not on file   Social History Narrative   Lives in [place] - currently by himself as his wife is in a nsg home.  He is active and visits his wife multiple x/day.    Review of Systems General:  Occas fatigue.  No chills, fever, night sweats or weight changes.  Cardiovascular:  +++ chest pain as outlined above.  No dyspnea on exertion, edema, orthopnea, palpitations, paroxysmal nocturnal dyspnea. Dermatological: No rash, lesions/masses Respiratory: No cough, dyspnea Urologic: No hematuria, dysuria Abdominal:   No nausea, vomiting, diarrhea, bright red blood per rectum, melena, or hematemesis Neurologic:  No visual changes, wkns, changes in mental status. All other systems reviewed and are otherwise negative except as noted above.  Physical Exam  Blood pressure 127/76, pulse 50, temperature 98.2 F (36.8 C), temperature source Oral, resp. rate 18, SpO2 100.00%.  General: Pleasant, NAD Psych: Normal affect. Neuro: Alert and oriented X 3. Moves all extremities spontaneously. HEENT: Normal  Neck: Supple without bruits or JVD. Lungs:  Resp regular and unlabored, CTA. Heart: RRR no s3, s4, 2/6 syst murmur @ apex. Abdomen: Soft, non-tender, non-distended, BS + x 4.  Extremities: No clubbing, cyanosis or edema. DP/PT/Radials 2+ and equal bilaterally.  Labs  Ti poc 0.02, pbnp 1098  Lab Results  Component Value Date   WBC 8.7 10/17/2012   HGB 13.5 10/17/2012   HCT 39.5 10/17/2012   MCV 84.8 10/17/2012   PLT 189 10/17/2012    Recent Labs Lab 10/17/12 1345  NA 138  K 4.6  CL 103  CO2 27  BUN 15  CREATININE 1.13    CALCIUM 9.5  GLUCOSE 142*   Radiology/Studies  Dg Chest 2 View  10/17/2012  *RADIOLOGY REPORT*  Clinical Data: Chest pain  CHEST - 2 VIEW  Comparison: 08/17/2012 and prior chest radiographs  Findings: The cardiomediastinal silhouette is unremarkable. Mild peribronchial thickening again noted. There is no evidence of focal airspace disease, pulmonary edema, suspicious pulmonary nodule/mass, pleural effusion, or pneumothorax. No acute bony abnormalities are identified.  IMPRESSION: No evidence of acute cardiopulmonary disease.  Mild chronic peribronchial thickening.   Original Report Authenticated  By: Harmon Pier, M.D.    ECG  Atrial flutter with 4-1 conduction, 62, no acute ST or T changes.  ASSESSMENT AND PLAN  1. Midsternal chest pain/coronary artery disease:  Patient presented after a 1 minute episode of chest pressure that occurred while lying down without associated symptoms and resolve spontaneously. His initial set of cardiac enzymes are negative. We'll plan to observe him tonight and continue to cycle cardiac markers. Continue home medications and titrate nitrate therapy.  2. Atrial flutter: Rate controlled in the setting of known conduction disease. This appears to be a new rhythm for him. He is currently asymptomatic and thus it seems unlikely that this contributed to his chest pain earlier today.  3. HTN:  Stable.  4. HL:  Cont statin Rx.  5.  Chronic systolic chf:  Euvolemic.  Cont home meds.  No bb 2/2 conduction dzs.   Signed, Nicolasa Ducking, NP 10/17/2012, 5:02 PM   I think this is low likelihood ACS, but he has a high pretest probability and obsrevation is appropriate  I would empirically increase his Nitrates  I am also impressed by the variability of his functional status and in the context of the observed atrial flutter I wonder whether this is the explanation.  The presence of the flutter begs the need for anticoagulation, and notwithstanding his age I would  begin him on Eliquis 2.5 mg bid, choosing a lower dose than the algorithm because of his greater than normal advanced age and the data that there is clear increased risk with older age

## 2012-10-17 NOTE — ED Notes (Addendum)
C/o L sided CP onset this am while eating breakfast, pain has decreased since onset. Denies nausea, but reports mild sob that "ive had for a while." Did take am meds.

## 2012-10-17 NOTE — ED Notes (Signed)
Report given to Mrs. Gigi Gin, RN

## 2012-10-17 NOTE — ED Provider Notes (Signed)
History    CSN: [id number] Arrival date & time 10/17/12  1144 First MD Initiated Contact with Patient 10/17/12 1212    Chief Complaint  Patient presents with  . Chest Pain    HPI  The patient presents to emergency room with complaints of chest discomfort. Patient noticed the discomfort this morning while he was eating breakfast. He said it was just a mild discomfort. He has been having episodes off and on of general malaise and fatigue but not necessarily chest pain.  At times he can exert himself and be fine but other times he feels poorly  He was able to do yardwork yesterday and he felt fine.  The pain today  was in the substernal region. It did not radiate to his jaw or arm. It was severe enough that he called his daughter who lives in [place].  Patient states he did not really feel short of breath with it. He has history of having mild shortness of breath for a long period of time but did not really associate that with his symptoms. He denies any abdominal pain. He does have history of heart disease but it is not sure if the symptoms are the same.  He has been following with his cardiologist regularly.   Past Medical History  Diagnosis Date  . Mitral insufficiency     CHRONIC  . Hypertension   . Hyperlipidemia   . 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 Coronary artery disease     WITH REMOTE LATERAL MYOCARDIAL INFARCTION; s/p Myoview 2/13 with consistent findings of known LCX occlusion and moderate LV dysfunction; EF is 40%  . Myocardial infarct, old   . Raynaud's disease /phenomenon   . Hard of hearing     bilaterally  . Gout   . PTSD (post-traumatic stress disorder)     "being treated for it now but not a big degree"  . Asthma   . Noncompliance with medication regimen   . CHF (congestive heart failure)     WITH MODERATE SYSTOLIC DYSFUNCTION    Past Surgical History  Procedure Laterality Date  . Prostatectomy    . Tonsillectomy    . Inguinal hernia repair      left   . Appendectomy    . Cardiac catheterization  2003    SHOWED OCCLUSION OF THE LEFT CIRCUMFLEX CORONARY ARTERY    Family History  Problem Relation Age of Onset  . Heart attack Brother     History  Substance Use Topics  . Smoking status: Former Smoker -- 1.50 packs/day for 20 years    Quit date: 12/12/1970  . Smokeless tobacco: Never Used  . Alcohol Use: No      Review of Systems  Constitutional: Negative for fever.  Respiratory: Negative for cough and shortness of breath.   Genitourinary: Negative for dysuria.  All other systems reviewed and are negative.    Allergies  Zocor  Home Medications   Current Outpatient Rx  Name  Route  Sig  Dispense  Refill  . allopurinol (ZYLOPRIM) 300 MG tablet   Oral   Take 300 mg by mouth daily.          Marland Kitchen amLODipine (NORVASC) 5 MG tablet   Oral   Take 1 tablet (5 mg total) by mouth daily.   90 tablet   1   . aspirin 325 MG tablet   Oral   Take 325 mg by mouth daily.           Marland Kitchen  Coenzyme Q10 (COQ10 PO)   Oral   Take 1 capsule by mouth daily.          . finasteride (PROSCAR) 5 MG tablet   Oral   Take 5 mg by mouth daily.          . furosemide (LASIX) 20 MG tablet   Oral   Take 40 mg by mouth every morning.         . isosorbide mononitrate (IMDUR) 30 MG 24 hr tablet   Oral   Take 1 tablet (30 mg total) by mouth daily.   90 tablet   11   . losartan (COZAAR) 50 MG tablet   Oral   Take 1 tablet (50 mg total) by mouth daily.   90 tablet   3   . nitroGLYCERIN (NITROSTAT) 0.4 MG SL tablet   Sublingual   Place 0.4 mg under the tongue every 5 (five) minutes as needed. x3 doses as needed for chest pain         . rosuvastatin (CRESTOR) 10 MG tablet   Oral   Take 10 mg by mouth every morning.           BP 131/67  Pulse 58  Temp(Src) 98.2 F (36.8 C) (Oral)  Resp 18  SpO2 100%  Physical Exam  Nursing note and vitals reviewed. Constitutional: No distress.  HENT:  Head: Normocephalic and  atraumatic.  Right Ear: External ear normal.  Left Ear: External ear normal.  Eyes: Conjunctivae are normal. Right eye exhibits no discharge. Left eye exhibits no discharge. No scleral icterus.  Neck: Neck supple. No tracheal deviation present.  Cardiovascular: Normal rate, regular rhythm and intact distal pulses.   Pulmonary/Chest: Effort normal and breath sounds normal. No stridor. No respiratory distress. He has no wheezes. He has no rales.  Abdominal: Soft. Bowel sounds are normal. He exhibits no distension. There is no tenderness. There is no rebound and no guarding.  Musculoskeletal: He exhibits no edema and no tenderness.  Neurological: He is alert. He has normal strength. No sensory deficit. Cranial nerve deficit:  no gross defecits noted. He exhibits normal muscle tone. He displays no seizure activity. Coordination normal.  Skin: Skin is warm and dry. No rash noted. He is not diaphoretic.  Psychiatric: He has a normal mood and affect.    ED Course  Procedures (including critical care time) EKG Rate 62 Atrial flutter with 4-1 AV conduction Nonspecific T wave abnormality Atrial flutter has replaced atrial fibrillation since previous EKG Labs Reviewed  BASIC METABOLIC PANEL - Abnormal; Notable for the following:    Glucose, Bld 142 (*)    GFR calc non Af Amer 55 (*)    GFR calc Af Amer 64 (*)    All other components within normal limits  PRO B NATRIURETIC PEPTIDE - Abnormal; Notable for the following:    Pro B Natriuretic peptide (BNP) 1098.0 (*)    All other components within normal limits  CBC  POCT I-STAT TROPONIN I   Dg Chest 2 View  10/17/2012  *RADIOLOGY REPORT*  Clinical Data: Chest pain  CHEST - 2 VIEW  Comparison: 08/17/2012 and prior chest radiographs  Findings: The cardiomediastinal silhouette is unremarkable. Mild peribronchial thickening again noted. There is no evidence of focal airspace disease, pulmonary edema, suspicious pulmonary nodule/mass, pleural  effusion, or pneumothorax. No acute bony abnormalities are identified.  IMPRESSION: No evidence of acute cardiopulmonary disease.  Mild chronic peribronchial thickening.   Original Report Authenticated By: Harmon Pier,  M.D.     1. Chest pain   2. Coronary artery disease     MDM  Discussed the findings with the patient.  Initial findings are normal however pt is at high risk for ACS considering his history.  Discussed with Dr Graciela Husbands, Cardiology, will plan on admission for further evaluation.        Celene Kras, MD 10/17/12 [phone]

## 2012-10-17 NOTE — ED Notes (Signed)
Attempt to call report to [phone]. Floor sts charge nurse did not assign pt to a nurse . Will return ED phone call.

## 2012-10-18 DIAGNOSIS — I4892 Unspecified atrial flutter: Secondary | ICD-10-CM

## 2012-10-18 DIAGNOSIS — R079 Chest pain, unspecified: Secondary | ICD-10-CM | POA: Diagnosis not present

## 2012-10-18 DIAGNOSIS — R0789 Other chest pain: Secondary | ICD-10-CM

## 2012-10-18 DIAGNOSIS — I5022 Chronic systolic (congestive) heart failure: Secondary | ICD-10-CM | POA: Diagnosis present

## 2012-10-18 LAB — TROPONIN I: Troponin I: <0.3 ng/mL (ref <0.30)

## 2012-10-18 LAB — TSH: TSH: 6.393 u[IU]/mL — ABNORMAL HIGH (ref 0.350–4.500)

## 2012-10-18 MED ORDER — NITROGLYCERIN 0.4 MG SL SUBL: 0.4000 mg | SUBLINGUAL_TABLET | SUBLINGUAL | Status: DC | PRN

## 2012-10-18 MED ORDER — APIXABAN 2.5 MG PO TABS: 2.5000 mg | ORAL_TABLET | Freq: Two times a day (BID) | ORAL | Status: DC

## 2012-10-18 MED ORDER — ISOSORBIDE MONONITRATE ER 60 MG PO TB24: 60.0000 mg | ORAL_TABLET | Freq: Every day | ORAL | Status: DC

## 2012-10-18 MED ORDER — ASPIRIN 81 MG PO TABS: 81.0000 mg | ORAL_TABLET | Freq: Every day | ORAL | Status: DC

## 2012-10-18 NOTE — Discharge Summary (Signed)
Patient ID: Craig [name],  MRN: [id number], DOB/AGE: 1918-10-16 79 y.o.  Admit date: 10/17/2012 Discharge date: 10/18/2012  Primary Care Provider: [name] Primary Cardiologist: P. Swaziland, MD  Discharge Diagnoses Principal Problem:   Midsternal chest pain Active Problems:   Atrial flutter  **New diagnosis - eliquis started this admission.   CAD (coronary artery disease)   Hypertension   Hyperlipidemia   Chronic systolic CHF (congestive heart failure)  Allergies Allergies  Allergen Reactions  . Zocor (Simvastatin) Other (See Comments)    Unknown reaction per pt    Procedures  None  History of Present Illness  79 year old male with prior history of coronary artery disease and variable degrees of asymptomatic heart block without indication for pacing. He was in his usual state of health until the morning of admission when while lying in bed he had sudden onset of mild midsternal chest discomfort without associated symptoms lasting approximately 1 minute and resolving spontaneously. He decided to present to the ED for evaluation and was pain-free in the ED. He had no objective evidence of ischemia by ECG or cardiac markers however was noted to be in any 4:1 atrial flutter, which was asymptomatic. Decision was made to observe him overnight and to initiate anticoagulation.  Hospital Course  Patient ruled out for myocardial infarction. He had no further chest discomfort. He remained in atrial flutter overnight and this is been well rate controlled. We have initiated Eliquis therapy at 2.5 mg twice a day secondary to advanced age. We recognize that he has normal renal function. We plan to discharge him home today in good condition. We will arrange for followup outpatient 2-D echocardiogram as well as office followup within the next 2 weeks.  Discharge Vitals Blood pressure 120/76, pulse 64, temperature 98.1 F (36.7 C), temperature source Oral, resp. rate 18, height 5'  10.5" (1.791 m), weight 178 lb 3.2 oz (80.831 kg), SpO2 100.00%.  Filed Weights   10/17/12 1820 10/18/12 0639  Weight: 178 lb 12.7 oz (81.1 kg) 178 lb 3.2 oz (80.831 kg)    Labs  CBC  Recent Labs  10/17/12 1345  WBC 8.7  HGB 13.5  HCT 39.5  MCV 84.8  PLT 189   Basic Metabolic Panel  Recent Labs  10/17/12 1345  NA 138  K 4.6  CL 103  CO2 27  GLUCOSE 142*  BUN 15  CREATININE 1.13  CALCIUM 9.5   Cardiac Enzymes  Recent Labs  10/17/12 1827 10/18/12 0005 10/18/12 0638  TROPONINI <0.30 <0.30 <0.30   Thyroid Function Tests  Pending  Disposition  Pt is being discharged home today in good condition.  Follow-up Plans & Appointments  Follow-up Information   Follow up with Peter Swaziland, MD. (2 weeks with either Dr. Swaziland or Norma Fredrickson, NP - we will arrange.)    Contact information:   [address] [place] [address] [phone]       Follow up with [facility] (Echocardiogram (heart ultrasound) - we will arrange to be done in the office.)    Contact information:   [address] [place] [address] [phone]     Discharge Medications    Medication List    TAKE these medications       allopurinol 300 MG tablet  Commonly known as:  ZYLOPRIM  Take 300 mg by mouth daily.     amLODipine 5 MG tablet  Commonly known as:  NORVASC  Take 1 tablet (5 mg total)  by mouth daily.     apixaban 2.5 MG Tabs tablet  Commonly known as:  ELIQUIS  Take 1 tablet (2.5 mg total) by mouth 2 (two) times daily.     aspirin 81 MG tablet  Take 1 tablet (81 mg total) by mouth daily.     COQ10 PO  Take 1 capsule by mouth daily.     finasteride 5 MG tablet  Commonly known as:  PROSCAR  Take 5 mg by mouth daily.     furosemide 20 MG tablet  Commonly known as:  LASIX  Take 40 mg by mouth every morning.     isosorbide mononitrate 60 MG 24 hr tablet  Commonly known as:  IMDUR  Take 1 tablet (60 mg total) by mouth daily.      losartan 50 MG tablet  Commonly known as:  COZAAR  Take 1 tablet (50 mg total) by mouth daily.     nitroGLYCERIN 0.4 MG SL tablet  Commonly known as:  NITROSTAT  Place 1 tablet (0.4 mg total) under the tongue every 5 (five) minutes as needed. x3 doses as needed for chest pain     rosuvastatin 10 MG tablet  Commonly known as:  CRESTOR  Take 10 mg by mouth every morning.       Outstanding Labs/Studies   TSH  Duration of Discharge Encounter   Greater than 30 minutes including physician time.  Signed, Nicolasa Ducking NP 10/18/2012, 8:54 AM

## 2012-10-18 NOTE — Progress Notes (Signed)
  Patient Name: Craig [name]      SUBJECTIVE: without complaint  Past Medical History  Diagnosis Date  . Mitral insufficiency     CHRONIC  . Hypertension   . Hyperlipidemia   . 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 Coronary artery disease     a. Remote lateral infarct;  b. 09/2011 MV: large dense inferolat/lat scar - ? very slight peri-infarcrt isch.  No signif change c/w 09/2010 scan.  . Myocardial infarct, old   . Raynaud's disease /phenomenon   . Hard of hearing     bilaterally  . Gout   . PTSD (post-traumatic stress disorder)     "being treated for it now but not a big degree"  . Asthma   . Noncompliance with medication regimen   . Chronic systolic CHF (congestive heart failure)     a. 10/2011 Echo: EF 40-45%, inf AK, PASP .    PHYSICAL EXAM Filed Vitals:   10/17/12 1820 10/17/12 2125 10/18/12 0020 10/18/12 0639  BP: 158/78 125/55 113/52 120/76  Pulse: 54 65 57 64  Temp: 97.3 F (36.3 C) 98.2 F (36.8 C)  98.1 F (36.7 C)  TempSrc: Oral Oral  Oral  Resp: 20 18 18 18   Height: 5' 10.5" (1.791 m)     Weight: 178 lb 12.7 oz (81.1 kg)   178 lb 3.2 oz (80.831 kg)  SpO2: 100% 100% 100% 100%    Well developed and nourished in no acute distress HENT normal Neck supple with JVP-flat Clear Regular rate and rhythm, no murmurs or gallops Abd-soft with active BS No Clubbing cyanosis edema Skin-warm and dry A & Oriented  Grossly normal sensory and motor function   TELEMETRY: Reviewed telemetry pt in  AFlutter with controlled ventricular response:    Intake/Output Summary (Last 24 hours) at 10/18/12 0747 Last data filed at 10/18/12 0500  Gross per 24 hour  Intake    240 ml  Output    400 ml  Net   -160 ml    LABS: Basic Metabolic Panel:  Recent Labs Lab 10/17/12 1345  NA 138  K 4.6  CL 103  CO2 27  GLUCOSE 142*  BUN 15  CREATININE 1.13  CALCIUM 9.5   Cardiac Enzymes:  Recent Labs  10/17/12 1827 10/18/12 0005  TROPONINI <0.30  <0.30   CBC:  Recent Labs Lab 10/17/12 1345  WBC 8.7  HGB 13.5  HCT 39.5  MCV 84.8  PLT 189   PROTIME: No results found for this basename: LABPROT, INR,  in the last 72 hours Liver Function Tests: No results found for this basename: AST, ALT, ALKPHOS, BILITOT, PROT, ALBUMIN,  in the last 72 hours No results found for this basename: LIPASE, AMYLASE,  in the last 72 hours BNP: BNP (last 3 results)  Recent Labs  10/20/11 1031 10/17/12 1350  PROBNP 1611.0* 1098.0*     ASSESSMENT AND PLAN: Principal Problem:   Midsternal chest pain Active Problems:   Hypertension   Hyperlipidemia   CAD (coronary artery disease)   Chronic systolic CHF (congestive heart failure)   Atrial flutter   Pt has r/o   Will send home on apixoban F/u with PJ 3-4 weeks (or LG) for apixoban followup   Signed, Sherryl Manges MD  10/18/2012

## 2012-10-18 NOTE — Progress Notes (Signed)
Utilization review completed.  

## 2012-10-19 NOTE — Progress Notes (Signed)
   CARE MANAGEMENT NOTE 10/19/2012  Patient:  Craig [name], Craig [name]   Account Number:  [id number]  Date Initiated:  10/19/2012  Documentation initiated by:  [facility]  Subjective/Objective Assessment:     Action/Plan:   Anticipated DC Date:  10/18/2012   Anticipated DC Plan:  HOME/SELF CARE      DC Planning Services  CM consult  Medication Assistance      Choice offered to / List presented to:             Status of service:  Completed, signed off Medicare Important Message given?   (If response is "NO", the following Medicare IM given date fields will be blank) Date Medicare IM given:   Date Additional Medicare IM given:    Discharge Disposition:  HOME/SELF CARE  Per UR Regulation:    If discussed at Long Length of Stay Meetings, dates discussed:    Comments:  10/19/2012 1100 [facility] and states [facility] was [facility]. Contacted Eliquis Support Program, the pharmacy will need to call [place] Dept at [phone] to get info on how enter information. Called pharmacy back and they were able to get card to go through. Isidoro Donning RN CCM Case Mgmt phone [phone]     10/19/2012 0930 NCM spoke to pt's dtr, [name]. Gave permission to activate free 30 day trial card Eliquis. States she will pick up today. Activated card and received Rx Bin # and [facility], fax # [phone]. Spoke to pharmacist to make aware. Isidoro Donning RN CCM Case  Eliquis Rx Bin [phone] Group [id number] Rx PCN [id number] ID # [id number]

## 2012-10-21 ENCOUNTER — Encounter: Payer: Self-pay | Admitting: Physician Assistant

## 2012-10-21 ENCOUNTER — Ambulatory Visit (INDEPENDENT_AMBULATORY_CARE_PROVIDER_SITE_OTHER): Payer: Medicare Other | Admitting: Physician Assistant

## 2012-10-21 VITALS — BP 144/80 | HR 60 | Ht 70.5 in | Wt 183.0 lb

## 2012-10-21 DIAGNOSIS — I10 Essential (primary) hypertension: Secondary | ICD-10-CM | POA: Diagnosis not present

## 2012-10-21 DIAGNOSIS — I2510 Atherosclerotic heart disease of native coronary artery without angina pectoris: Secondary | ICD-10-CM | POA: Diagnosis not present

## 2012-10-21 DIAGNOSIS — I4892 Unspecified atrial flutter: Secondary | ICD-10-CM | POA: Diagnosis not present

## 2012-10-21 NOTE — Patient Instructions (Addendum)
Your physician has requested that you have an echocardiogram. Echocardiography is a painless test that uses sound waves to create images of your heart. It provides your doctor with information about the size and shape of your heart and how well your heart's chambers and valves are working. This procedure takes approximately one hour. There are no restrictions for this procedure.  Continue taking all of your medications as prescribed including your Eloquis  Your physician recommends that you schedule a follow-up appointment in: 1 month with Dr. Swaziland.

## 2012-10-21 NOTE — Assessment & Plan Note (Signed)
No further chest pain

## 2012-10-21 NOTE — Assessment & Plan Note (Signed)
Blood pressure stable ? ?

## 2012-10-21 NOTE — Assessment & Plan Note (Addendum)
Patient was found to be in 4-1 atrial flutter in the hospital and was advised to start Eliquis, but never started after reading the side effects. I had a long discussion with him and his 2 daughters about the reason for starting this drug and he agrees to try it for one month. Follow up with Dr. Swaziland and 2Decho.

## 2012-10-21 NOTE — Progress Notes (Signed)
HPI:  This is a 79 year old male patient with history of coronary artery disease and variable degrees of asymptomatic heart block without prior indication for pacing. He presented the emergency room with sudden onset mild sternal chest pain that lasted less than a minute. He presented the emergency room and was found to be in atrial flutter He was observed overnight and was placed on Eliquis 2.5 mg BID secondary to his advanced age and normal renal function. Unfortunately he chose not to take this medication after he read the side effects. In addition to this his wife passed away last night and it trying to make arrangements at this time. 2-D echo was supposed to be performed but was never scheduled.  Patient denies any further chest pain, palpitations, dyspnea, dyspnea on exertion, dizziness, or pre-syncope.      Allergies: -- Zocor (Simvastatin) -- Other (See Comments)   --  Unknown reaction per pt  Current Outpatient Prescriptions on File Prior to Visit: allopurinol (ZYLOPRIM) 300 MG tablet, Take 300 mg by mouth daily. , Disp: , Rfl:  amLODipine (NORVASC) 5 MG tablet, Take 1 tablet (5 mg total) by mouth daily., Disp: 90 tablet, Rfl: 1 aspirin 81 MG tablet, Take 1 tablet (81 mg total) by mouth daily., Disp: , Rfl:  Coenzyme Q10 (COQ10 PO), Take 1 capsule by mouth daily. , Disp: , Rfl:  finasteride (PROSCAR) 5 MG tablet, Take 5 mg by mouth daily. , Disp: , Rfl:   furosemide (LASIX) 20 MG tablet, Take 40 mg by mouth every morning., Disp: , Rfl:  isosorbide mononitrate (IMDUR) 60 MG 24 hr tablet, Take 1 tablet (60 mg total) by mouth daily., Disp: 30 tablet, Rfl: 6 losartan (COZAAR) 50 MG tablet, Take 1 tablet (50 mg total) by mouth daily., Disp: 90 tablet, Rfl: 3 nitroGLYCERIN (NITROSTAT) 0.4 MG SL tablet, Place 1 tablet (0.4 mg total) under the tongue every 5 (five) minutes as needed. x3 doses as needed for chest pain, Disp: 25 tablet, Rfl: 3 rosuvastatin (CRESTOR) 10 MG tablet, Take 10 mg by  mouth every morning., Disp: , Rfl:  apixaban (ELIQUIS) 2.5 MG TABS tablet, Take 1 tablet (2.5 mg total) by mouth 2 (two) times daily., Disp: 60 tablet, Rfl: 6  No current facility-administered medications on file prior to visit.   Past Medical History:   Mitral insufficiency                                           Comment:CHRONIC   Hypertension                                                 Hyperlipidemia                                               AV block, 2nd degree                                           Comment:MOBITZ TYPE I   Coronary artery disease  Comment:a. Remote lateral infarct;  b. 09/2011 MV: large              dense inferolat/lat scar - ? very slight               peri-infarcrt isch.  No signif change c/w               09/2010 scan.   Myocardial infarct, old                                      Raynaud's disease /phenomenon                                Hard of hearing                                                Comment:bilaterally   Gout                                                         PTSD (post-traumatic stress disorder)                          Comment:"being treated for it now but not a big degree"   Asthma                                                       Noncompliance with medication regimen                        Chronic systolic CHF (congestive heart failure)                Comment:a. 10/2011 Echo: EF 40-45%, inf AK, PASP .  Past Surgical History:   PROSTATECTOMY                                                 TONSILLECTOMY                                                 INGUINAL HERNIA REPAIR                                          Comment:left   APPENDECTOMY  CARDIAC CATHETERIZATION                          2003           Comment:SHOWED OCCLUSION OF THE LEFT CIRCUMFLEX               CORONARY ARTERY  Review of patient's family history  indicates:   Heart attack                   Brother                  Alcohol abuse                  Father                     Comment: died in his late 65's   Other                          Mother                     Comment: died in her late 69's - old age.   Social History   Marital Status: Married             Spouse Name:                      Years of Education:                 Number of children: 8           Occupational History Occupation          Administrator, sports*                       Social History Main Topics   Smoking Status: Former Smoker                   Packs/Day: 1.50  Years: 20        Quit date: 12/12/1970   Smokeless Status: Never Used                       Alcohol Use: No             Drug Use: No             Sexual Activity: No                 Other Topics            Concern   None on file  Social History Narrative   Lives in [place] - currently by himself as his wife is in a nsg home.  He is active and visits his wife multiple x/day.    ROS:he wears 2 hearing aids,see history of present illness otherwise negative   PHYSICAL EXAM: Well-nournished, in no acute distress. Neck: No JVD, HJR, Bruit, or thyroid enlargement  Lungs: No tachypnea, clear without wheezing, rales, or rhonchi  Cardiovascular: RRR, PMI not displaced, heart sounds normal, no murmurs, gallops, bruit, thrill, or heave.  Abdomen: BS normal. Soft without organomegaly, masses, lesions or tenderness.  Extremities: without cyanosis, clubbing or edema. Good distal pulses bilateral  SKin: Warm, no lesions or rashes   Musculoskeletal: No deformities  Neuro: no focal signs  BP 144/80  Pulse 60  Ht 5' 10.5" (1.791 m)  Wt 183 lb (83.008 kg)  BMI 25.88 kg/m2   ZOX:WRUEAV flutter with 4-1 AV conduction  2Decho 10/2011: Study Conclusions  - Left ventricle: Systolic function was mildly to moderately   reduced. The estimated ejection  fraction was in the range   of 40% to 45%. Akinesis of the entireinferior myocardium;   consistent with infarction. - Pulmonary arteries: Systolic pressure was moderately to   severely increased. PA peak pressure: 57mm Hg (S). Transthoracic echocardiography.  Lexiscan 11/2011: Overall Impression:  There is a large dense scar that affects the entire inferolateral wall and lateral wall. There may be very slight peri-infarct ischemia. The images are very similar to the prior study of February, 2012.  Cath 12/19/10: FINAL INTERPRETATION: 1. Single-vessel occlusive atherosclerotic coronary artery disease. 2. Low-normal left ventricular function. 3. Mild to moderate mitral insufficiency.   PLAN:  Continue medical therapy.  This study is unchanged from 1997

## 2012-10-22 ENCOUNTER — Telehealth: Payer: Self-pay | Admitting: *Deleted

## 2012-10-22 MED ORDER — ROSUVASTATIN CALCIUM 10 MG PO TABS: 10.0000 mg | ORAL_TABLET | Freq: Every morning | ORAL | Status: DC

## 2012-10-22 NOTE — Telephone Encounter (Signed)
F/u  Please return call to [facility] regarding pre-Auth for patient meds as well as medication samples. Patient is at pharmacy visibly upset, pharmacy trying to help him out.  [facility] for [facility] [phone]

## 2012-10-22 NOTE — Telephone Encounter (Signed)
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

## 2012-10-27 ENCOUNTER — Other Ambulatory Visit (HOSPITAL_COMMUNITY): Payer: Medicare Other

## 2012-10-29 ENCOUNTER — Ambulatory Visit (HOSPITAL_COMMUNITY): Payer: Medicare Other | Attending: Cardiology

## 2012-10-29 DIAGNOSIS — I2510 Atherosclerotic heart disease of native coronary artery without angina pectoris: Secondary | ICD-10-CM | POA: Insufficient documentation

## 2012-10-29 DIAGNOSIS — R079 Chest pain, unspecified: Secondary | ICD-10-CM | POA: Diagnosis not present

## 2012-10-29 DIAGNOSIS — I10 Essential (primary) hypertension: Secondary | ICD-10-CM | POA: Diagnosis not present

## 2012-10-29 DIAGNOSIS — E785 Hyperlipidemia, unspecified: Secondary | ICD-10-CM | POA: Diagnosis not present

## 2012-10-29 DIAGNOSIS — I4892 Unspecified atrial flutter: Secondary | ICD-10-CM | POA: Diagnosis not present

## 2012-10-29 DIAGNOSIS — I7300 Raynaud's syndrome without gangrene: Secondary | ICD-10-CM | POA: Diagnosis not present

## 2012-10-29 NOTE — Progress Notes (Signed)
Echocardiogram performed.  

## 2012-10-30 ENCOUNTER — Telehealth: Payer: Self-pay | Admitting: Cardiology

## 2012-10-30 NOTE — Telephone Encounter (Signed)
New problem    Pt had Echocardiogram 10/29/12 and want to know his results.

## 2012-10-30 NOTE — Telephone Encounter (Signed)
Spoke to patient was told echo results not available, will call back Monday 11/02/12.Also after patient had echo yesterday 3/37/14 he stated he stopped Eliquis.Stated he read the side effects and was afraid to take.Patient was told spoke to Dr.Jordan and he advised Eliquis is a safer blood thinner to take less side effects.Advised he needs to take Eliquis 2.5 mg twice a day.Patient stated he would start taking.

## 2012-11-03 ENCOUNTER — Telehealth: Payer: Self-pay | Admitting: Cardiology

## 2012-11-03 NOTE — Telephone Encounter (Signed)
Spoke with patient echo results not available yet.Will call back when results available.

## 2012-11-03 NOTE — Telephone Encounter (Signed)
New problem:  Test results.  

## 2012-11-06 NOTE — Telephone Encounter (Signed)
Spoke to patient echo results given.Advised to continue medication and keep appointment with Dr.Jordan 12/01/11.

## 2012-11-23 ENCOUNTER — Telehealth: Payer: Self-pay | Admitting: Cardiology

## 2012-11-23 ENCOUNTER — Other Ambulatory Visit: Payer: Self-pay

## 2012-11-23 MED ORDER — ROSUVASTATIN CALCIUM 10 MG PO TABS: 10.0000 mg | ORAL_TABLET | Freq: Every morning | ORAL | Status: DC

## 2012-11-23 MED ORDER — ATORVASTATIN CALCIUM 10 MG PO TABS: 10.0000 mg | ORAL_TABLET | Freq: Every day | ORAL | Status: DC

## 2012-11-23 NOTE — Telephone Encounter (Signed)
See 11/23/12 note

## 2012-11-23 NOTE — Telephone Encounter (Signed)
New problem   Pt need samples of Crestor until he can get his prescription resolved at the pharmacy. Please call pt.

## 2012-11-23 NOTE — Telephone Encounter (Signed)
Returned call to patient insurance will not cover crestor.Dr.Jordan prescribed lipitor 10 mg daily.[facility] [place].

## 2012-11-30 ENCOUNTER — Ambulatory Visit (INDEPENDENT_AMBULATORY_CARE_PROVIDER_SITE_OTHER): Payer: Medicare Other | Admitting: Cardiology

## 2012-11-30 ENCOUNTER — Encounter: Payer: Self-pay | Admitting: Cardiology

## 2012-11-30 VITALS — BP 154/98 | HR 60 | Ht 70.5 in | Wt 188.4 lb

## 2012-11-30 DIAGNOSIS — I2510 Atherosclerotic heart disease of native coronary artery without angina pectoris: Secondary | ICD-10-CM

## 2012-11-30 DIAGNOSIS — I509 Heart failure, unspecified: Secondary | ICD-10-CM

## 2012-11-30 DIAGNOSIS — I4892 Unspecified atrial flutter: Secondary | ICD-10-CM

## 2012-11-30 DIAGNOSIS — I441 Atrioventricular block, second degree: Secondary | ICD-10-CM

## 2012-11-30 DIAGNOSIS — I5022 Chronic systolic (congestive) heart failure: Secondary | ICD-10-CM

## 2012-11-30 NOTE — Patient Instructions (Signed)
Stop ASA   Continue your other medication including Eliquis.   I will see you in 3 months.

## 2012-11-30 NOTE — Progress Notes (Signed)
Craig [name] Date of Birth: 09-Aug-1918 Medical Record #[id number]  History of Present Illness: [name] is seen for followup today. He was evaluated in the emergency department in March for chest mild chest pain. He was found to be in atrial flutter with a 4:1 AV block. He was observed overnight and it was recommended that he start anticoagulation with Eliquis2.5 mg twice a day. He is still taking aspirin as well. He does complain of occasional chest pain. He basically has good days and bad. On his bad days he feels weak and has some shortness of breath. 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  Current Outpatient Prescriptions on File Prior to Visit  Medication Sig Dispense Refill  . allopurinol (ZYLOPRIM) 300 MG tablet Take 300 mg by mouth daily.       Marland Kitchen amLODipine (NORVASC) 5 MG tablet Take 1 tablet (5 mg total) by mouth daily.  90 tablet  1  . apixaban (ELIQUIS) 2.5 MG TABS tablet Take 1 tablet (2.5 mg total) by mouth 2 (two) times daily.  60 tablet  6  . atorvastatin (LIPITOR) 10 MG tablet Take 1 tablet (10 mg total) by mouth daily.  30 tablet  6  . Coenzyme Q10 (COQ10 PO) Take 1 capsule by mouth daily.       . finasteride (PROSCAR) 5 MG tablet Take 5 mg by mouth daily.       . furosemide (LASIX) 20 MG tablet Take 40 mg by mouth every morning.      . isosorbide mononitrate (IMDUR) 60 MG 24 hr tablet Take 1 tablet (60 mg total) by mouth daily.  30 tablet  6  . losartan (COZAAR) 50 MG tablet Take 1 tablet (50 mg total) by mouth daily.  90 tablet  3  . nitroGLYCERIN (NITROSTAT) 0.4 MG SL tablet Place 1 tablet (0.4 mg total) under the tongue every 5 (five) minutes as needed. x3 doses as needed for chest pain  25 tablet  3   No current facility-administered medications on file prior to visit.    Allergies  Allergen Reactions  . Zocor (Simvastatin) Other (See Comments)    Unknown reaction per pt    Past Medical History  Diagnosis Date  .  Mitral insufficiency     CHRONIC  . Hypertension   . Hyperlipidemia   . 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 Coronary artery disease     a. Remote lateral infarct;  b. 09/2011 MV: large dense inferolat/lat scar - ? very slight peri-infarcrt isch.  No signif change c/w 09/2010 scan.  . Myocardial infarct, old   . Raynaud's disease /phenomenon   . Hard of hearing     bilaterally  . Gout   . PTSD (post-traumatic stress disorder)     "being treated for it now but not a big degree"  . Asthma   . Noncompliance with medication regimen   . Chronic systolic CHF (congestive heart failure)     a. 10/2011 Echo: EF 40-45%, inf AK, PASP .  . Atrial flutter     Past Surgical History  Procedure Laterality Date  . Prostatectomy    . Tonsillectomy    . Inguinal hernia repair      left  . Appendectomy    . Cardiac catheterization  2003    SHOWED OCCLUSION OF THE LEFT CIRCUMFLEX CORONARY ARTERY    History  Smoking status  .  Former Smoker -- 1.50 packs/day for 20 years  . Quit date: 12/12/1970  Smokeless tobacco  . Never Used    History  Alcohol Use No    Family History  Problem Relation Age of Onset  . Heart attack Brother   . Alcohol abuse Father     died in his late 71's  . Other Mother     died in her late 21's - old age.    Review of Systems: As noted in history of present illness  All other systems were reviewed and are negative.  Physical Exam: BP 154/98  Pulse 60  Ht 5' 10.5" (1.791 m)  Wt 188 lb 6.4 oz (85.458 kg)  BMI 26.64 kg/m2 He is an elderly white male, pleasant, in no distress. He is balding. He wears a hearing aid. Pupils are equal round and reactive. Sclera are clear. Oropharynx is clear. Neck is without JVD, adenopathy, thyromegaly, or bruits. Lungs are clear to auscultation and percussion. Cardiac exam reveals a regular rate and rhythm with a grade 2/6 systolic murmur at the apex. Abdomen is soft and nontender without organomegaly or  masses. Extremities are without edema or cyanosis. Pulses are 2+. Skin is warm and dry. Neurologic exam reveals no focal abnormalities. Mood is appropriate. LABORATORY DATA: Lab Results  Component Value Date   WBC 8.7 10/17/2012   HGB 13.5 10/17/2012   HCT 39.5 10/17/2012   PLT 189 10/17/2012   GLUCOSE 142* 10/17/2012   CHOL 131 08/29/2011   TRIG 75 08/29/2011   HDL 46 08/29/2011   LDLCALC 70 08/29/2011   ALT 25 08/29/2011   AST 15 08/29/2011   NA 138 10/17/2012   K 4.6 10/17/2012   CL 103 10/17/2012   CREATININE 1.13 10/17/2012   BUN 15 10/17/2012   CO2 27 10/17/2012   TSH 6.393* 10/18/2012   INR 1.08 08/29/2011   HGBA1C 6.5* 08/29/2011   Transthoracic Echocardiography  Patient: Craig [name], Craig [name] MR #: [id number] Study Date: 10/29/2012 Gender: M Age: 33 Height: 180.3cm Weight: 83kg BSA: 2.75m^2 Pt. Status: Room:  ORDERING [place], Caelyn Route ATTENDING Patty Sermons [facility] Curt[place], Lorin Mercy, Michele REFERRING Jacolyn Reedy PERFORMING [facility], [facility] SONOGRAPHER Philomena Course, RDCS cc:  ------------------------------------------------------------ LV EF: 40% - 45%  ------------------------------------------------------------ Indications: Atrial flutter 427.32.  ------------------------------------------------------------ History: PMH: Raynaud's syndrome. Acquired from the patient and from the patient's chart. Chest pain. Atrial flutter. Coronary artery disease. PMH: Myocardial infarction. Risk factors: Hypertension. Dyslipidemia.  ------------------------------------------------------------ Study Conclusions  - Left ventricle: The cavity size was normal. Systolic function was mildly to moderately reduced. The estimated ejection fraction was in the range of 40% to 45%. There is hypokinesis of the entireinferior myocardium. Left ventricular diastolic function parameters were normal. - Mitral valve: Moderate to severe regurgitation. - Left  atrium: The atrium was severely dilated. - Right ventricle: Systolic function was reduced. - Atrial septum: No defect or patent foramen ovale was identified. - Pulmonary arteries: PA peak pressure: 45mm Hg (S).  ------------------------------------------------------------ Labs, prior tests, procedures, and surgery: Catheterization (2003).  Transthoracic echocardiography. M-mode, complete 2D, spectral Doppler, and color Doppler. Height: Height: 180.3cm. Height: 71in. Weight: Weight: 83kg. Weight: 182.6lb. Body mass index: BMI: 25.5kg/m^2. Body surface area: BSA: 2.44m^2. Blood pressure: 144/80. Patient status: Outpatient. Location: [place] [facility]  ------------------------------------------------------------  ------------------------------------------------------------ Left ventricle: Asymmetrical septal hypertrophy. The cavity size was normal. Systolic function was mildly to moderately reduced. The estimated ejection fraction was in the range of 40% to 45%. Regional wall motion  abnormalities: There is hypokinesis of the entireinferior myocardium. The transmitral flow pattern was normal. The deceleration time of the early transmitral flow velocity was normal. The pulmonary vein flow pattern was normal. The tissue Doppler parameters were normal. Left ventricular diastolic function parameters were normal.  ------------------------------------------------------------ Aortic valve: Structurally normal valve. Trileaflet. Cusp separation was normal. Doppler: Transvalvular velocity was within the normal range. There was no stenosis. No regurgitation.  ------------------------------------------------------------ Aorta: The aorta was normal, not dilated, and non-diseased.  ------------------------------------------------------------ Mitral valve: Structurally normal valve. Leaflet separation was normal. Doppler: Transvalvular velocity was within the normal range. There was no  evidence for stenosis. Moderate to severe regurgitation. Peak gradient: 2mm Hg (D).  ------------------------------------------------------------ Left atrium: The atrium was severely dilated.  ------------------------------------------------------------ Atrial septum: No defect or patent foramen ovale was identified.  ------------------------------------------------------------ Right ventricle: The cavity size was normal. Wall thickness was normal. Systolic function was reduced.  ------------------------------------------------------------ Pulmonic valve: Structurally normal valve. Cusp separation was normal. Doppler: Transvalvular velocity was within the normal range. Trivial regurgitation.  ------------------------------------------------------------ Tricuspid valve: Structurally normal valve. Leaflet separation was normal. Doppler: Transvalvular velocity was within the normal range. Mild regurgitation.  ------------------------------------------------------------ Pulmonary artery: The main pulmonary artery was normal-sized.  ------------------------------------------------------------ Right atrium: The atrium was normal in size.  ------------------------------------------------------------ Pericardium: The pericardium was normal in appearance.  ------------------------------------------------------------ Systemic veins: Inferior vena cava: The vessel was normal in size; the respirophasic diameter changes were in the normal range (= 50%); findings are consistent with normal central venous pressure.  ------------------------------------------------------------ Post procedure conclusions Ascending Aorta:  - The aorta was normal, not dilated, and non-diseased.  ------------------------------------------------------------  2D measurements Normal Doppler measurements Norma Left ventricle l LVID ED, 55.6 mm 43-52 Main pulmonary artery chord, Pressure, 45 mm Hg  =30 PLAX S LVID ES, 46.3 mm 23-38 Left ventricle chord, Ea, lat 9.4 cm/s ----- PLAX ann, tiss 6 FS, chord, 17 % >29 DP PLAX E/Ea, lat 8.1 ----- LVPW, ED 9.2 mm ------ ann, tiss 2 IVS/LVPW 1.57 <1.3 DP ratio, ED Ea, med 5.9 cm/s ----- Ventricular septum ann, tiss 5 IVS, ED 14.4 mm ------ DP LVOT E/Ea, med 12. ----- Diam, S 23 mm ------ ann, tiss 91 Area 4.15 cm^2 ------ DP Diam 23 mm ------ LVOT Aorta Peak vel, 70 cm/s ----- Root diam, 35 mm ------ S ED VTI, S 15. cm ----- Left atrium 7 AP dim 51 mm ------ HR 67 bpm ----- AP dim 2.51 cm/m^2 <2.2 Stroke vol 65. ml ----- index 2 Cardiac 4.4 L/min ----- output Cardiac 2.2 L/(min-m ----- index ^2) Stroke 32. ml/m^2 ----- index 1 Mitral valve Peak E vel 76. cm/s ----- 8 Peak A vel 52. cm/s ----- 4 Decelerati 180 ms 150-2 on time 30 Peak 2 mm Hg ----- gradient, D Peak E/A 1.5 ----- ratio Regurg 59. cm/s ----- alias vel, 7 PISA Max regurg 575 cm/s ----- vel Regurg VTI 196 cm ----- ERO, PISA 0.1 cm^2 ----- Regurg 20 ml ----- vol, PISA RF, PISA 23. % ----- 47 Tricuspid valve Regurg 315 cm/s ----- peak vel Peak RV-RA 40 mm Hg ----- gradient, S Systemic veins Estimated 5 mm Hg ----- CVP Right ventricle Pressure, 45 mm Hg <30 S Sa vel, 12. cm/s ----- lat ann, 4 tiss DP  ------------------------------------------------------------ Prepared and Electronically Authenticated by  Cassell Clement 2014-03-27T18:[id number] And Assessment / Plan:  1. Coronary disease with chronic occlusion of the left circumflex coronary. Mild anginal symptoms.  2. Mobitz type I second-degree AV block.   3. Congestive heart  failure chronic systolic, clinically well compensated. Ejection fraction of 40-45%and unchanged from one year before.   4. Mitral insufficiency. Moderate to severe.  5. Hypertension.   6. Atrial flutter-rate is well controlled. Agree with anticoagulation to reduce risk of stroke. I recommended he  stop taking aspirin.

## 2012-12-18 DIAGNOSIS — E039 Hypothyroidism, unspecified: Secondary | ICD-10-CM | POA: Diagnosis not present

## 2013-01-01 ENCOUNTER — Other Ambulatory Visit: Payer: Self-pay

## 2013-01-01 MED ORDER — ATORVASTATIN CALCIUM 10 MG PO TABS: 10.0000 mg | ORAL_TABLET | Freq: Every day | ORAL | Status: DC

## 2013-01-01 NOTE — Telephone Encounter (Signed)
Charna Elizabeth, LPN at 1/61/0960  3:27 PM  Status: Signed          Returned call to patient insurance will not cover crestor.Dr.Jordan prescribed lipitor 10 mg daily.[facility] [place].    Pt needs 90 day supply

## 2013-01-13 DIAGNOSIS — N3000 Acute cystitis without hematuria: Secondary | ICD-10-CM | POA: Diagnosis not present

## 2013-01-13 DIAGNOSIS — R319 Hematuria, unspecified: Secondary | ICD-10-CM | POA: Diagnosis not present

## 2013-01-18 DIAGNOSIS — N3090 Cystitis, unspecified without hematuria: Secondary | ICD-10-CM | POA: Diagnosis not present

## 2013-01-20 DIAGNOSIS — R319 Hematuria, unspecified: Secondary | ICD-10-CM | POA: Diagnosis not present

## 2013-01-22 DIAGNOSIS — R310 Gross hematuria: Secondary | ICD-10-CM | POA: Diagnosis not present

## 2013-01-22 DIAGNOSIS — N400 Enlarged prostate without lower urinary tract symptoms: Secondary | ICD-10-CM | POA: Diagnosis not present

## 2013-02-16 ENCOUNTER — Other Ambulatory Visit: Payer: Self-pay | Admitting: *Deleted

## 2013-02-16 ENCOUNTER — Other Ambulatory Visit (HOSPITAL_COMMUNITY): Payer: Self-pay | Admitting: Cardiology

## 2013-02-16 DIAGNOSIS — R209 Unspecified disturbances of skin sensation: Secondary | ICD-10-CM | POA: Diagnosis not present

## 2013-02-16 MED ORDER — FUROSEMIDE 20 MG PO TABS: 40.0000 mg | ORAL_TABLET | Freq: Every morning | ORAL | Status: DC

## 2013-02-17 DIAGNOSIS — N400 Enlarged prostate without lower urinary tract symptoms: Secondary | ICD-10-CM | POA: Diagnosis not present

## 2013-02-17 DIAGNOSIS — R310 Gross hematuria: Secondary | ICD-10-CM | POA: Diagnosis not present

## 2013-02-23 DIAGNOSIS — R310 Gross hematuria: Secondary | ICD-10-CM | POA: Diagnosis not present

## 2013-02-23 DIAGNOSIS — N400 Enlarged prostate without lower urinary tract symptoms: Secondary | ICD-10-CM | POA: Diagnosis not present

## 2013-02-23 DIAGNOSIS — I700 Atherosclerosis of aorta: Secondary | ICD-10-CM | POA: Diagnosis not present

## 2013-02-25 DIAGNOSIS — R209 Unspecified disturbances of skin sensation: Secondary | ICD-10-CM | POA: Diagnosis not present

## 2013-03-04 ENCOUNTER — Ambulatory Visit (INDEPENDENT_AMBULATORY_CARE_PROVIDER_SITE_OTHER): Payer: Medicare Other | Admitting: Cardiology

## 2013-03-04 ENCOUNTER — Encounter: Payer: Self-pay | Admitting: Cardiology

## 2013-03-04 VITALS — BP 142/74 | HR 56 | Ht 70.5 in | Wt 183.0 lb

## 2013-03-04 DIAGNOSIS — I2510 Atherosclerotic heart disease of native coronary artery without angina pectoris: Secondary | ICD-10-CM | POA: Diagnosis not present

## 2013-03-04 DIAGNOSIS — I441 Atrioventricular block, second degree: Secondary | ICD-10-CM

## 2013-03-04 DIAGNOSIS — I4892 Unspecified atrial flutter: Secondary | ICD-10-CM

## 2013-03-04 DIAGNOSIS — I509 Heart failure, unspecified: Secondary | ICD-10-CM

## 2013-03-04 DIAGNOSIS — I5022 Chronic systolic (congestive) heart failure: Secondary | ICD-10-CM

## 2013-03-04 NOTE — Patient Instructions (Signed)
Continue your current therapy  I will see you in 4 months  

## 2013-03-04 NOTE — Progress Notes (Signed)
Craig [name] Date of Birth: 01-09-1919 Medical Record #[id number]  History of Present Illness: Craig [name] is seen for followup today. He reports he is doing very well. He is still working in the garden several days a week and exercising regularly. He denies any significant dyspnea, chest pain, dizziness, or fatigue. He occasionally gets the "blahs" and so he will rest that day and the symptoms will resolve. He's had no syncope. He was seen by urology recently he's had no further hematuria. He has been voiding okay. He recently has been treated for numbness in his right foot with prednisone. This hasn't really helped.  Current Outpatient Prescriptions on File Prior to Visit  Medication Sig Dispense Refill  . allopurinol (ZYLOPRIM) 300 MG tablet Take 300 mg by mouth daily.       Marland Kitchen amLODipine (NORVASC) 5 MG tablet Take 1 tablet (5 mg total) by mouth daily.  90 tablet  1  . apixaban (ELIQUIS) 2.5 MG TABS tablet Take 1 tablet (2.5 mg total) by mouth 2 (two) times daily.  60 tablet  6  . atorvastatin (LIPITOR) 10 MG tablet Take 1 tablet (10 mg total) by mouth daily.  90 tablet  0  . Coenzyme Q10 (COQ10 PO) Take 1 capsule by mouth daily.       . finasteride (PROSCAR) 5 MG tablet Take 5 mg by mouth daily.       . furosemide (LASIX) 20 MG tablet Take 2 tablets (40 mg total) by mouth every morning.  60 tablet  3  . isosorbide mononitrate (IMDUR) 60 MG 24 hr tablet Take 1 tablet (60 mg total) by mouth daily.  30 tablet  6  . losartan (COZAAR) 50 MG tablet Take 1 tablet (50 mg total) by mouth daily.  90 tablet  3  . nitroGLYCERIN (NITROSTAT) 0.4 MG SL tablet Place 1 tablet (0.4 mg total) under the tongue every 5 (five) minutes as needed. x3 doses as needed for chest pain  25 tablet  3   No current facility-administered medications on file prior to visit.    Allergies  Allergen Reactions  . Zocor (Simvastatin) Other (See Comments)    Unknown reaction per pt    Past Medical History  Diagnosis  Date  . Mitral insufficiency     CHRONIC  . Hypertension   . Hyperlipidemia   . 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 Coronary artery disease     a. Remote lateral infarct;  b. 09/2011 MV: large dense inferolat/lat scar - ? very slight peri-infarcrt isch.  No signif change c/w 09/2010 scan.  . Myocardial infarct, old   . Raynaud's disease /phenomenon   . Hard of hearing     bilaterally  . Gout   . PTSD (post-traumatic stress disorder)     "being treated for it now but not a big degree"  . Asthma   . Noncompliance with medication regimen   . Chronic systolic CHF (congestive heart failure)     a. 10/2011 Echo: EF 40-45%, inf AK, PASP .  . Atrial flutter     Past Surgical History  Procedure Laterality Date  . Prostatectomy    . Tonsillectomy    . Inguinal hernia repair      left  . Appendectomy    . Cardiac catheterization  2003    SHOWED OCCLUSION OF THE LEFT CIRCUMFLEX CORONARY ARTERY    History  Smoking status  . Former Smoker -- 1.50 packs/day  for 20 years  . Quit date: 12/12/1970  Smokeless tobacco  . Never Used    History  Alcohol Use No    Family History  Problem Relation Age of Onset  . Heart attack Brother   . Alcohol abuse Father     died in his late 78's  . Other Mother     died in her late 108's - old age.    Review of Systems: As noted in history of present illness  All other systems were reviewed and are negative.  Physical Exam: BP 142/74  Pulse 56  Ht 5' 10.5" (1.791 m)  Wt 183 lb (83.008 kg)  BMI 25.88 kg/m2  SpO2 98% He is an elderly white male, pleasant, in no distress. He is balding. He wears a hearing aid. Pupils are equal round and reactive. Sclera are clear. Oropharynx is clear. Neck is without JVD, adenopathy, thyromegaly, or bruits. Lungs are clear to auscultation and percussion. Cardiac exam reveals a regular rate and rhythm with a grade 2/6 systolic murmur at the apex. Abdomen is soft and nontender without organomegaly  or masses. Extremities are without edema or cyanosis. Pulses are 2+. Skin is warm and dry. Neurologic exam reveals no focal abnormalities. Mood is appropriate. LABORATORY DATA:  Assessment / Plan:  1. Coronary disease with chronic occlusion of the left circumflex coronary. No significant anginal symptoms. Continue amlodipine and isosorbide.  2. Mobitz type I second-degree AV block. No clear relation to symptoms. We'll continue to monitor. Needs to avoid beta blocker therapy.  3. Congestive heart failure chronic systolic, clinically well compensated. Ejection fraction of 40-45%and unchanged from one year before. Weight is down 5 pounds. He is euvolemic.  4. Mitral insufficiency. Moderate to severe.  5. Hypertension.   6. Atrial flutter-rate is well controlled. On prophylactic anticoagulation with Eliquis.

## 2013-03-13 DIAGNOSIS — R5383 Other fatigue: Secondary | ICD-10-CM | POA: Diagnosis not present

## 2013-03-13 DIAGNOSIS — R5381 Other malaise: Secondary | ICD-10-CM | POA: Diagnosis not present

## 2013-03-17 DIAGNOSIS — F4321 Adjustment disorder with depressed mood: Secondary | ICD-10-CM | POA: Diagnosis not present

## 2013-03-17 DIAGNOSIS — R209 Unspecified disturbances of skin sensation: Secondary | ICD-10-CM | POA: Diagnosis not present

## 2013-03-18 ENCOUNTER — Ambulatory Visit
Admission: RE | Admit: 2013-03-18 | Discharge: 2013-03-18 | Disposition: A | Discharge status: Home / Self Care | Payer: Medicare Other | Source: Ambulatory Visit | Attending: Family Medicine | Admitting: Family Medicine

## 2013-03-18 ENCOUNTER — Other Ambulatory Visit: Payer: Self-pay | Admitting: Family Medicine

## 2013-03-18 DIAGNOSIS — M5137 Other intervertebral disc degeneration, lumbosacral region: Secondary | ICD-10-CM | POA: Diagnosis not present

## 2013-03-18 DIAGNOSIS — R209 Unspecified disturbances of skin sensation: Secondary | ICD-10-CM

## 2013-03-19 ENCOUNTER — Other Ambulatory Visit: Payer: Self-pay | Admitting: *Deleted

## 2013-03-19 ENCOUNTER — Other Ambulatory Visit: Payer: Self-pay | Admitting: Cardiology

## 2013-03-21 ENCOUNTER — Other Ambulatory Visit: Payer: Self-pay | Admitting: Cardiology

## 2013-03-23 ENCOUNTER — Other Ambulatory Visit: Payer: Self-pay | Admitting: Family Medicine

## 2013-03-23 DIAGNOSIS — M545 Low back pain: Secondary | ICD-10-CM

## 2013-03-25 ENCOUNTER — Ambulatory Visit
Admission: RE | Admit: 2013-03-25 | Discharge: 2013-03-25 | Disposition: A | Discharge status: Home / Self Care | Payer: Medicare Other | Source: Ambulatory Visit | Attending: Family Medicine | Admitting: Family Medicine

## 2013-03-25 DIAGNOSIS — M47817 Spondylosis without myelopathy or radiculopathy, lumbosacral region: Secondary | ICD-10-CM | POA: Diagnosis not present

## 2013-03-25 DIAGNOSIS — M545 Low back pain: Secondary | ICD-10-CM

## 2013-04-03 ENCOUNTER — Other Ambulatory Visit: Payer: Self-pay | Admitting: Cardiology

## 2013-04-06 ENCOUNTER — Other Ambulatory Visit: Payer: Self-pay | Admitting: Cardiology

## 2013-04-16 DIAGNOSIS — H43399 Other vitreous opacities, unspecified eye: Secondary | ICD-10-CM | POA: Diagnosis not present

## 2013-04-16 DIAGNOSIS — Z961 Presence of intraocular lens: Secondary | ICD-10-CM | POA: Diagnosis not present

## 2013-05-18 DIAGNOSIS — Z23 Encounter for immunization: Secondary | ICD-10-CM | POA: Diagnosis not present

## 2013-06-01 ENCOUNTER — Other Ambulatory Visit (HOSPITAL_COMMUNITY): Payer: Self-pay | Admitting: Cardiology

## 2013-06-05 ENCOUNTER — Other Ambulatory Visit (HOSPITAL_COMMUNITY): Payer: Self-pay | Admitting: Nurse Practitioner

## 2013-06-08 DIAGNOSIS — L03319 Cellulitis of trunk, unspecified: Secondary | ICD-10-CM | POA: Diagnosis not present

## 2013-06-08 DIAGNOSIS — L259 Unspecified contact dermatitis, unspecified cause: Secondary | ICD-10-CM | POA: Diagnosis not present

## 2013-06-08 DIAGNOSIS — L02219 Cutaneous abscess of trunk, unspecified: Secondary | ICD-10-CM | POA: Diagnosis not present

## 2013-06-14 ENCOUNTER — Other Ambulatory Visit: Payer: Self-pay | Admitting: Cardiology

## 2013-06-14 DIAGNOSIS — R319 Hematuria, unspecified: Secondary | ICD-10-CM | POA: Diagnosis not present

## 2013-06-21 ENCOUNTER — Other Ambulatory Visit: Payer: Self-pay | Admitting: *Deleted

## 2013-07-12 ENCOUNTER — Ambulatory Visit (INDEPENDENT_AMBULATORY_CARE_PROVIDER_SITE_OTHER): Payer: Medicare Other | Admitting: Cardiology

## 2013-07-12 ENCOUNTER — Encounter: Payer: Self-pay | Admitting: Cardiology

## 2013-07-12 VITALS — BP 140/78 | HR 78 | Ht 70.0 in | Wt 187.0 lb

## 2013-07-12 DIAGNOSIS — I441 Atrioventricular block, second degree: Secondary | ICD-10-CM | POA: Diagnosis not present

## 2013-07-12 DIAGNOSIS — I5022 Chronic systolic (congestive) heart failure: Secondary | ICD-10-CM | POA: Diagnosis not present

## 2013-07-12 DIAGNOSIS — I340 Nonrheumatic mitral (valve) insufficiency: Secondary | ICD-10-CM

## 2013-07-12 DIAGNOSIS — I2510 Atherosclerotic heart disease of native coronary artery without angina pectoris: Secondary | ICD-10-CM | POA: Diagnosis not present

## 2013-07-12 DIAGNOSIS — I059 Rheumatic mitral valve disease, unspecified: Secondary | ICD-10-CM

## 2013-07-12 DIAGNOSIS — I509 Heart failure, unspecified: Secondary | ICD-10-CM

## 2013-07-12 NOTE — Patient Instructions (Signed)
Continue your current therapy  I will see you in 4 months  

## 2013-07-12 NOTE — Progress Notes (Signed)
Craig [name] Date of Birth: [birth year] Medical Record #[id number]  History of Present Illness: Craig [name] is seen for followup today. Overall he is doing very well. He is still working in the yard several days a week and exercising regularly. He denies any significant dyspnea, chest pain, dizziness, or fatigue. He still has good days and bad days. On his bad days he complains of fatigue primarily.  He's had no syncope.   Current Outpatient Prescriptions on File Prior to Visit  Medication Sig Dispense Refill  . allopurinol (ZYLOPRIM) 300 MG tablet Take 300 mg by mouth daily.       Marland Kitchen amLODipine (NORVASC) 5 MG tablet TAKE 1 TABLET (5 MG TOTAL) BY MOUTH DAILY.  90 tablet  1  . atorvastatin (LIPITOR) 10 MG tablet TAKE 1 TABLET BY MOUTH EVERY EVENING (THIS REPLACES CRESTOR)  30 tablet  6  . Coenzyme Q10 (COQ10 PO) Take 1 capsule by mouth daily.       Marland Kitchen ELIQUIS 2.5 MG TABS tablet TAKE 1 TABLET (2.5 MG TOTAL) BY MOUTH 2 (TWO) TIMES DAILY.  60 tablet  6  . finasteride (PROSCAR) 5 MG tablet Take 5 mg by mouth daily.       . furosemide (LASIX) 20 MG tablet TAKE 2 TABLETS (40 MG TOTAL) BY MOUTH EVERY MORNING.  60 tablet  3  . isosorbide mononitrate (IMDUR) 60 MG 24 hr tablet TAKE 1 TABLET (60 MG TOTAL) BY MOUTH DAILY.  30 tablet  1  . losartan (COZAAR) 50 MG tablet TAKE 1 TABLET (50 MG TOTAL) BY MOUTH DAILY.  90 tablet  3  . nitroGLYCERIN (NITROSTAT) 0.4 MG SL tablet Place 1 tablet (0.4 mg total) under the tongue every 5 (five) minutes as needed. x3 doses as needed for chest pain  25 tablet  3   No current facility-administered medications on file prior to visit.    Allergies  Allergen Reactions  . Zocor [Simvastatin] Other (See Comments)    Unknown reaction per pt    Past Medical History  Diagnosis Date  . Mitral insufficiency     CHRONIC  . Hypertension   . Hyperlipidemia   . 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 Coronary artery disease     a. Remote lateral infarct;  b. 09/2011  MV: large dense inferolat/lat scar - ? very slight peri-infarcrt isch.  No signif change c/w 09/2010 scan.  . Myocardial infarct, old   . Raynaud's disease /phenomenon   . Hard of hearing     bilaterally  . Gout   . PTSD (post-traumatic stress disorder)     "being treated for it now but not a big degree"  . Asthma   . Noncompliance with medication regimen   . Chronic systolic CHF (congestive heart failure)     a. 10/2011 Echo: EF 40-45%, inf AK, PASP .  . Atrial flutter     Past Surgical History  Procedure Laterality Date  . Prostatectomy    . Tonsillectomy    . Inguinal hernia repair      left  . Appendectomy    . Cardiac catheterization  2003    SHOWED OCCLUSION OF THE LEFT CIRCUMFLEX CORONARY ARTERY    History  Smoking status  . Former Smoker -- 1.50 packs/day for 20 years  . Quit date: 12/12/1970  Smokeless tobacco  . Never Used    History  Alcohol Use No    Family History  Problem Relation Age  of Onset  . Heart attack Brother   . Alcohol abuse Father     died in his late 65's  . Other Mother     died in her late 77's - old age.    Review of Systems: As noted in history of present illness  All other systems were reviewed and are negative.  Physical Exam: BP 140/78  Pulse 78  Ht 5\' 10"  (1.778 m)  Wt 187 lb (84.823 kg)  BMI 26.83 kg/m2 He is an elderly white male, pleasant, in no distress. He is balding. He wears a hearing aid. Pupils are equal round and reactive. Sclera are clear. Oropharynx is clear. Neck is without JVD, adenopathy, thyromegaly, or bruits. Lungs are clear to auscultation and percussion. Cardiac exam reveals a regular rate and rhythm with a grade 2/6 systolic murmur at the apex. Abdomen is soft and nontender without organomegaly or masses. Extremities are without edema or cyanosis. Pulses are 2+. Skin is warm and dry. Neurologic exam reveals no focal abnormalities. Mood is appropriate.  LABORATORY DATA:  Assessment /  Plan:  1. Coronary disease with chronic occlusion of the left circumflex coronary. No significant anginal symptoms. Continue amlodipine and isosorbide.  2. Mobitz type I second-degree AV block. No clear relation to symptoms. We'll continue to monitor. Needs to avoid beta blocker therapy.  3. Congestive heart failure chronic systolic, clinically well compensated. Ejection fraction of 40-45%and unchanged from one year before.  He is euvolemic.  4. Mitral insufficiency. Moderate to severe.  5. Hypertension.   I will follow up in 4 months and check complete lab work at that time. 6. Atrial flutter-rate is well controlled. On prophylactic anticoagulation with Eliquis.

## 2013-07-17 ENCOUNTER — Other Ambulatory Visit: Payer: Self-pay | Admitting: Cardiology

## 2013-07-20 DIAGNOSIS — R5381 Other malaise: Secondary | ICD-10-CM | POA: Diagnosis not present

## 2013-07-20 DIAGNOSIS — E860 Dehydration: Secondary | ICD-10-CM | POA: Diagnosis not present

## 2013-07-24 ENCOUNTER — Other Ambulatory Visit (HOSPITAL_COMMUNITY): Payer: Self-pay | Admitting: Cardiology

## 2013-07-27 ENCOUNTER — Telehealth: Payer: Self-pay | Admitting: Cardiology

## 2013-07-27 NOTE — Telephone Encounter (Signed)
New message    Question about dosage for isosorbide and furosemide.  Dr Reade---pcp--dr says for pt to take a different dosage.  Also -pls fax to Dr Nicholos Johns at [place] his correct [employer].

## 2013-07-27 NOTE — Telephone Encounter (Signed)
Daughter just wanted to clarify dose of medications and have last office visit office from Dr [place] be sent to Dr Nicholos Johns.  This was done.

## 2013-08-13 ENCOUNTER — Other Ambulatory Visit: Payer: Self-pay

## 2013-08-13 MED ORDER — AMLODIPINE BESYLATE 5 MG PO TABS: ORAL_TABLET | ORAL | Status: DC

## 2013-08-27 DIAGNOSIS — N400 Enlarged prostate without lower urinary tract symptoms: Secondary | ICD-10-CM | POA: Diagnosis not present

## 2013-09-14 DIAGNOSIS — IMO0002 Reserved for concepts with insufficient information to code with codable children: Secondary | ICD-10-CM | POA: Diagnosis not present

## 2013-09-14 DIAGNOSIS — M1710 Unilateral primary osteoarthritis, unspecified knee: Secondary | ICD-10-CM | POA: Diagnosis not present

## 2013-09-14 DIAGNOSIS — R209 Unspecified disturbances of skin sensation: Secondary | ICD-10-CM | POA: Diagnosis not present

## 2013-09-24 DIAGNOSIS — E039 Hypothyroidism, unspecified: Secondary | ICD-10-CM | POA: Diagnosis not present

## 2013-09-29 ENCOUNTER — Other Ambulatory Visit (HOSPITAL_COMMUNITY): Payer: Self-pay | Admitting: Cardiology

## 2013-09-30 ENCOUNTER — Other Ambulatory Visit: Payer: Self-pay | Admitting: Cardiology

## 2013-10-07 ENCOUNTER — Other Ambulatory Visit (HOSPITAL_COMMUNITY): Payer: Self-pay | Admitting: Nurse Practitioner

## 2013-10-07 ENCOUNTER — Other Ambulatory Visit: Payer: Self-pay | Admitting: Cardiology

## 2013-10-28 DIAGNOSIS — E559 Vitamin D deficiency, unspecified: Secondary | ICD-10-CM | POA: Diagnosis not present

## 2013-10-28 DIAGNOSIS — R5381 Other malaise: Secondary | ICD-10-CM | POA: Diagnosis not present

## 2013-10-28 DIAGNOSIS — E039 Hypothyroidism, unspecified: Secondary | ICD-10-CM | POA: Diagnosis not present

## 2013-10-28 DIAGNOSIS — R5383 Other fatigue: Secondary | ICD-10-CM | POA: Diagnosis not present

## 2013-11-08 ENCOUNTER — Emergency Department (HOSPITAL_COMMUNITY)
Admission: EM | Admit: 2013-11-08 | Discharge: 2013-11-08 | Disposition: A | Discharge status: Home / Self Care | Payer: Medicare Other | Attending: Emergency Medicine | Admitting: Emergency Medicine

## 2013-11-08 ENCOUNTER — Encounter (HOSPITAL_COMMUNITY): Payer: Self-pay | Admitting: Emergency Medicine

## 2013-11-08 DIAGNOSIS — M109 Gout, unspecified: Secondary | ICD-10-CM | POA: Diagnosis not present

## 2013-11-08 DIAGNOSIS — I252 Old myocardial infarction: Secondary | ICD-10-CM | POA: Insufficient documentation

## 2013-11-08 DIAGNOSIS — Z7902 Long term (current) use of antithrombotics/antiplatelets: Secondary | ICD-10-CM | POA: Diagnosis not present

## 2013-11-08 DIAGNOSIS — Z8659 Personal history of other mental and behavioral disorders: Secondary | ICD-10-CM | POA: Diagnosis not present

## 2013-11-08 DIAGNOSIS — Z9889 Other specified postprocedural states: Secondary | ICD-10-CM | POA: Insufficient documentation

## 2013-11-08 DIAGNOSIS — Z87891 Personal history of nicotine dependence: Secondary | ICD-10-CM | POA: Insufficient documentation

## 2013-11-08 DIAGNOSIS — I2510 Atherosclerotic heart disease of native coronary artery without angina pectoris: Secondary | ICD-10-CM | POA: Diagnosis not present

## 2013-11-08 DIAGNOSIS — I10 Essential (primary) hypertension: Secondary | ICD-10-CM | POA: Diagnosis not present

## 2013-11-08 DIAGNOSIS — R5381 Other malaise: Secondary | ICD-10-CM | POA: Insufficient documentation

## 2013-11-08 DIAGNOSIS — Z8669 Personal history of other diseases of the nervous system and sense organs: Secondary | ICD-10-CM | POA: Diagnosis not present

## 2013-11-08 DIAGNOSIS — I5022 Chronic systolic (congestive) heart failure: Secondary | ICD-10-CM | POA: Diagnosis not present

## 2013-11-08 DIAGNOSIS — E785 Hyperlipidemia, unspecified: Secondary | ICD-10-CM | POA: Insufficient documentation

## 2013-11-08 DIAGNOSIS — Z79899 Other long term (current) drug therapy: Secondary | ICD-10-CM | POA: Diagnosis not present

## 2013-11-08 DIAGNOSIS — R5383 Other fatigue: Principal | ICD-10-CM

## 2013-11-08 DIAGNOSIS — R531 Weakness: Secondary | ICD-10-CM

## 2013-11-08 DIAGNOSIS — J45909 Unspecified asthma, uncomplicated: Secondary | ICD-10-CM | POA: Insufficient documentation

## 2013-11-08 LAB — BASIC METABOLIC PANEL
BUN: 19 mg/dL (ref 6–23)
CO2: 23 mEq/L (ref 19–32)
Calcium: 9.2 mg/dL (ref 8.4–10.5)
Chloride: 104 mEq/L (ref 96–112)
Creatinine, Ser: 1.22 mg/dL (ref 0.50–1.35)
GFR calc Af Amer: 57 mL/min — ABNORMAL LOW (ref >90)
GFR calc non Af Amer: 50 mL/min — ABNORMAL LOW (ref >90)
Glucose, Bld: 133 mg/dL — ABNORMAL HIGH (ref 70–99)
Potassium: 4.3 mEq/L (ref 3.7–5.3)
Sodium: 143 mEq/L (ref 137–147)

## 2013-11-08 LAB — CBC WITH DIFFERENTIAL/PLATELET
Basophils Absolute: 0 10*3/uL (ref 0.0–0.1)
Basophils Relative: 0 % (ref 0–1)
Eosinophils Absolute: 0.4 10*3/uL (ref 0.0–0.7)
Eosinophils Relative: 4 % (ref 0–5)
HCT: 39.4 % (ref 39.0–52.0)
Hemoglobin: 13.3 g/dL (ref 13.0–17.0)
Lymphocytes Relative: 21 % (ref 12–46)
Lymphs Abs: 1.7 10*3/uL (ref 0.7–4.0)
MCH: 28.5 pg (ref 26.0–34.0)
MCHC: 33.8 g/dL (ref 30.0–36.0)
MCV: 84.4 fL (ref 78.0–100.0)
Monocytes Absolute: 0.9 10*3/uL (ref 0.1–1.0)
Monocytes Relative: 12 % (ref 3–12)
Neutro Abs: 4.9 10*3/uL (ref 1.7–7.7)
Neutrophils Relative %: 63 % (ref 43–77)
Platelets: 193 10*3/uL (ref 150–400)
RBC: 4.67 MIL/uL (ref 4.22–5.81)
RDW: 16 % — ABNORMAL HIGH (ref 11.5–15.5)
WBC: 7.9 10*3/uL (ref 4.0–10.5)

## 2013-11-08 LAB — URINALYSIS, ROUTINE W REFLEX MICROSCOPIC
Bilirubin Urine: NEGATIVE
Glucose, UA: NEGATIVE mg/dL
Hgb urine dipstick: NEGATIVE
Ketones, ur: NEGATIVE mg/dL
Leukocytes, UA: NEGATIVE
Nitrite: NEGATIVE
Protein, ur: NEGATIVE mg/dL
Specific Gravity, Urine: 1.015 (ref 1.005–1.030)
Urobilinogen, UA: 0.2 mg/dL (ref 0.0–1.0)
pH: 6 (ref 5.0–8.0)

## 2013-11-08 LAB — TROPONIN I: Troponin I: <0.3 ng/mL (ref <0.30)

## 2013-11-08 NOTE — ED Provider Notes (Signed)
CSN: [id number]     Arrival date & time 11/08/13  0935 History   First MD Initiated Contact with Patient 11/08/13 [phone]     Chief Complaint  Patient presents with  . Weakness     (Consider location/radiation/quality/duration/timing/severity/associated sxs/prior Treatment) HPI  80yM presenting with 2 days history of feeling "blah." Describes generalized weakness. Sometimes feels like this but will usually come and go. Presenting today because it hasn't improved. Not much in terms of any other acute complaints. Patient remains fairly active. 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 No recent medications changes aside from synthroid being increased ~2 weeks ago. TSH then was mildly elevated. No fever or chills. No acute pain. Appetite has been good. No urinary complaints. Mild shortness of breath   Past Medical History  Diagnosis Date  . Mitral insufficiency     CHRONIC  . Hypertension   . Hyperlipidemia   . 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 Coronary artery disease     a. Remote lateral infarct;  b. 09/2011 MV: large dense inferolat/lat scar - ? very slight peri-infarcrt isch.  No signif change c/w 09/2010 scan.  . Myocardial infarct, old   . Raynaud's disease /phenomenon   . Hard of hearing     bilaterally  . Gout   . PTSD (post-traumatic stress disorder)     "being treated for it now but not a big degree"  . Asthma   . Noncompliance with medication regimen   . Chronic systolic CHF (congestive heart failure)     a. 10/2011 Echo: EF 40-45%, inf AK, PASP 57mmHg.  . Atrial flutter    Past Surgical History  Procedure Laterality Date  . Prostatectomy    . Tonsillectomy    . Inguinal hernia repair      left  . Appendectomy    . Cardiac catheterization  2003    SHOWED OCCLUSION OF THE LEFT CIRCUMFLEX CORONARY ARTERY   Family History  Problem Relation Age of Onset  . Heart attack Brother   . Alcohol abuse Father    died in his late 4250's  . Other Mother     died in her late 6380's - old age.   History  Substance Use Topics  . Smoking status: Former Smoker -- 1.50 packs/day for 20 years    Quit date: 12/12/1970  . Smokeless tobacco: Never Used  . Alcohol Use: No    Review of Systems  All systems reviewed and negative, other than as noted in HPI.   Allergies  Zocor  Home Medications   Current Outpatient Rx  Name  Route  Sig  Dispense  Refill  . allopurinol (ZYLOPRIM) 300 MG tablet   Oral   Take 300 mg by mouth daily.          Marland Kitchen. amLODipine (NORVASC) 5 MG tablet      TAKE 1 TABLET (5 MG TOTAL) BY MOUTH DAILY.   90 tablet   1   . atorvastatin (LIPITOR) 10 MG tablet      TAKE 1 TABLET BY MOUTH EVERY EVENING (THIS REPLACES CRESTOR)   30 tablet   6   . atorvastatin (LIPITOR) 10 MG tablet      TAKE 1 TABLET BY MOUTH EVERY EVENING (THIS REPLACES CRESTOR)   30 tablet   0   . atorvastatin (LIPITOR) 10 MG tablet      TAKE 1  TABLET BY MOUTH EVERY EVENING (THIS REPLACES CRESTOR)   30 tablet   1   . Cholecalciferol (VITAMIN D-3) 1000 UNITS CAPS   Oral   Take 1 capsule by mouth daily.         . Coenzyme Q10 (COQ10 PO)   Oral   Take 1 capsule by mouth daily.          Marland Kitchen ELIQUIS 2.5 MG TABS tablet      TAKE 1 TABLET (2.5 MG TOTAL) BY MOUTH 2 (TWO) TIMES DAILY.   60 tablet   6   . finasteride (PROSCAR) 5 MG tablet   Oral   Take 5 mg by mouth daily.          . furosemide (LASIX) 20 MG tablet      TAKE 2 TABLETS BY MOUTH EVERY MORNING.   60 tablet   1   . isosorbide mononitrate (IMDUR) 60 MG 24 hr tablet      TAKE 1 TABLET (60 MG TOTAL) BY MOUTH DAILY.   30 tablet   1   . isosorbide mononitrate (IMDUR) 60 MG 24 hr tablet      TAKE 1 TABLET (60 MG TOTAL) BY MOUTH DAILY.   30 tablet   1   . losartan (COZAAR) 50 MG tablet      TAKE 1 TABLET (50 MG TOTAL) BY MOUTH DAILY.   90 tablet   3   . nitroGLYCERIN (NITROSTAT) 0.4 MG SL tablet   Sublingual   Place  1 tablet (0.4 mg total) under the tongue every 5 (five) minutes as needed. x3 doses as needed for chest pain   25 tablet   3    BP 142/72  Temp(Src) 97.6 F (36.4 C) (Oral)  Resp 16  Ht 5\' 10"  (1.778 m)  Wt 187 lb (84.823 kg)  BMI 26.83 kg/m2  SpO2 99% Physical Exam  Nursing note and vitals reviewed. Constitutional: He appears well-developed and well-nourished. No distress.  Laying in bed watching tv. Appears well.   HENT:  Head: Normocephalic and atraumatic.  Hearing aid  Eyes: Conjunctivae are normal. Right eye exhibits no discharge. Left eye exhibits no discharge.  Neck: Neck supple.  Cardiovascular: Normal rate, regular rhythm and normal heart sounds.  Exam reveals no gallop and no friction rub.   No murmur heard. Pulmonary/Chest: Effort normal and breath sounds normal. No respiratory distress.  Abdominal: Soft. He exhibits no distension. There is no tenderness.  Musculoskeletal: He exhibits no edema and no tenderness.  Neurological: He is alert. No cranial nerve deficit. He exhibits normal muscle tone. Coordination normal.  Good finger to nose b/l  Skin: Skin is warm and dry.  Psychiatric: He has a normal mood and affect. His behavior is normal. Thought content normal.    ED Course  Procedures (including critical care time) Labs Review Labs Reviewed  CBC WITH DIFFERENTIAL - Abnormal; Notable for the following:    RDW 16.0 (*)    All other components within normal limits  BASIC METABOLIC PANEL - Abnormal; Notable for the following:    Glucose, Bld 133 (*)    GFR calc non Af Amer 50 (*)    GFR calc Af Amer 57 (*)    All other components within normal limits  URINALYSIS, ROUTINE W REFLEX MICROSCOPIC  TROPONIN I   Imaging Review     EKG Interpretation   Date/Time:  Monday November 08 2013 09:57:23 EDT Ventricular Rate:  50 PR Interval:    QRS Duration:  74 QT Interval:  467 QTC Calculation: 426 R Axis:   -21 Text Interpretation:  Atrial flutter Borderline  left axis deviation  Abnormal R-wave progression, early transition Minimal ST depression,  inferior leads ED PHYSICIAN INTERPRETATION AVAILABLE IN [facility]  Confirmed by TEST, Record ([id number]) on 11/10/2013 7:11:27 AM      MDM   Final diagnoses:  Generalized weakness    80 year old male with generalized fatigue/male ACE. Neurological examination and is nonfocal. The patient appears very well for his age and seems like he is very high functioning at his baseline. Workup today has been pretty unremarkable. Do not have an exact explanation for his presenting complaints, but I have a very low suspicion for serious bacterial illness, significant metabolic arrangement or other potential emergent pathology. I feel is safe for discharge at this time. Return precautions were discussed. Outpatient followup otherwise.    Raeford Razor, MD 11/14/13 [phone]

## 2013-11-08 NOTE — Discharge Instructions (Signed)
Weakness  Weakness is a lack of strength. It may be felt all over the body (generalized) or in one specific part of the body (focal). Some causes of weakness can be serious. You may need further medical evaluation, especially if you are elderly or you have a history of immunosuppression (such as chemotherapy or HIV), kidney disease, heart disease, or diabetes.  CAUSES   Weakness can be caused by many different things, including:  · Infection.  · Physical exhaustion.  · Internal bleeding or other blood loss that results in a lack of red blood cells (anemia).  · Dehydration. This cause is more common in elderly people.  · Side effects or electrolyte abnormalities from medicines, such as pain medicines or sedatives.  · Emotional distress, anxiety, or depression.  · Circulation problems, especially severe peripheral arterial disease.  · Heart disease, such as rapid atrial fibrillation, bradycardia, or heart failure.  · Nervous system disorders, such as Guillain-Barré syndrome, multiple sclerosis, or stroke.  DIAGNOSIS   To find the cause of your weakness, your caregiver will take your history and perform a physical exam. Lab tests or X-rays may also be ordered, if needed.  TREATMENT   Treatment of weakness depends on the cause of your symptoms and can vary greatly.  HOME CARE INSTRUCTIONS   · Rest as needed.  · Eat a well-balanced diet.  · Try to get some exercise every day.  · Only take over-the-counter or prescription medicines as directed by your caregiver.  SEEK MEDICAL CARE IF:   · Your weakness seems to be getting worse or spreads to other parts of your body.  · You develop new aches or pains.  SEEK IMMEDIATE MEDICAL CARE IF:   · You cannot perform your normal daily activities, such as getting dressed and feeding yourself.  · You cannot walk up and down stairs, or you feel exhausted when you do so.  · You have shortness of breath or chest pain.  · You have difficulty moving parts of your body.  · You have weakness  in only one area of the body or on only one side of the body.  · You have a fever.  · You have trouble speaking or swallowing.  · You cannot control your bladder or bowel movements.  · You have black or bloody vomit or stools.  MAKE SURE YOU:  · Understand these instructions.  · Will watch your condition.  · Will get help right away if you are not doing well or get worse.  Document Released: 07/22/2005 Document Revised: 01/21/2012 Document Reviewed: 09/20/2011  [employer]® Patient Information ©2014 [employer].

## 2013-11-08 NOTE — ED Notes (Signed)
Pt reports feeling weak for the past 2 days, denies any pain but admits to shortness of breath upon exertion.  Pt does have cardiac hx, sinus brady noted on monitor.  Pt denies any complaints at this time besides fatigue.

## 2013-11-12 ENCOUNTER — Encounter (HOSPITAL_COMMUNITY): Payer: Self-pay | Admitting: Emergency Medicine

## 2013-11-12 ENCOUNTER — Emergency Department (HOSPITAL_COMMUNITY)
Admission: EM | Admit: 2013-11-12 | Discharge: 2013-11-12 | Disposition: A | Discharge status: Home / Self Care | Payer: Medicare Other | Attending: Emergency Medicine | Admitting: Emergency Medicine

## 2013-11-12 ENCOUNTER — Emergency Department (HOSPITAL_COMMUNITY): Payer: Medicare Other

## 2013-11-12 DIAGNOSIS — E785 Hyperlipidemia, unspecified: Secondary | ICD-10-CM | POA: Insufficient documentation

## 2013-11-12 DIAGNOSIS — H9190 Unspecified hearing loss, unspecified ear: Secondary | ICD-10-CM | POA: Diagnosis not present

## 2013-11-12 DIAGNOSIS — Z91199 Patient's noncompliance with other medical treatment and regimen due to unspecified reason: Secondary | ICD-10-CM | POA: Diagnosis not present

## 2013-11-12 DIAGNOSIS — I10 Essential (primary) hypertension: Secondary | ICD-10-CM | POA: Insufficient documentation

## 2013-11-12 DIAGNOSIS — Z87891 Personal history of nicotine dependence: Secondary | ICD-10-CM | POA: Insufficient documentation

## 2013-11-12 DIAGNOSIS — I498 Other specified cardiac arrhythmias: Secondary | ICD-10-CM | POA: Insufficient documentation

## 2013-11-12 DIAGNOSIS — Z8659 Personal history of other mental and behavioral disorders: Secondary | ICD-10-CM | POA: Diagnosis not present

## 2013-11-12 DIAGNOSIS — Z9119 Patient's noncompliance with other medical treatment and regimen: Secondary | ICD-10-CM | POA: Insufficient documentation

## 2013-11-12 DIAGNOSIS — R011 Cardiac murmur, unspecified: Secondary | ICD-10-CM | POA: Insufficient documentation

## 2013-11-12 DIAGNOSIS — J45909 Unspecified asthma, uncomplicated: Secondary | ICD-10-CM | POA: Diagnosis not present

## 2013-11-12 DIAGNOSIS — Z9889 Other specified postprocedural states: Secondary | ICD-10-CM | POA: Diagnosis not present

## 2013-11-12 DIAGNOSIS — M109 Gout, unspecified: Secondary | ICD-10-CM | POA: Insufficient documentation

## 2013-11-12 DIAGNOSIS — Z79899 Other long term (current) drug therapy: Secondary | ICD-10-CM | POA: Diagnosis not present

## 2013-11-12 DIAGNOSIS — R5383 Other fatigue: Secondary | ICD-10-CM

## 2013-11-12 DIAGNOSIS — I2510 Atherosclerotic heart disease of native coronary artery without angina pectoris: Secondary | ICD-10-CM | POA: Diagnosis not present

## 2013-11-12 DIAGNOSIS — Z7902 Long term (current) use of antithrombotics/antiplatelets: Secondary | ICD-10-CM | POA: Diagnosis not present

## 2013-11-12 DIAGNOSIS — I252 Old myocardial infarction: Secondary | ICD-10-CM | POA: Diagnosis not present

## 2013-11-12 DIAGNOSIS — I5022 Chronic systolic (congestive) heart failure: Secondary | ICD-10-CM | POA: Diagnosis not present

## 2013-11-12 DIAGNOSIS — R5381 Other malaise: Secondary | ICD-10-CM | POA: Insufficient documentation

## 2013-11-12 LAB — URINALYSIS, ROUTINE W REFLEX MICROSCOPIC
Bilirubin Urine: NEGATIVE
Glucose, UA: NEGATIVE mg/dL
HGB URINE DIPSTICK: NEGATIVE
KETONES UR: NEGATIVE mg/dL
Leukocytes, UA: NEGATIVE
Nitrite: NEGATIVE
PROTEIN: NEGATIVE mg/dL
Specific Gravity, Urine: 1.009 (ref 1.005–1.030)
UROBILINOGEN UA: 0.2 mg/dL (ref 0.0–1.0)
pH: 6 (ref 5.0–8.0)

## 2013-11-12 LAB — CBC
HCT: 37.8 % — ABNORMAL LOW (ref 39.0–52.0)
HEMOGLOBIN: 13.1 g/dL (ref 13.0–17.0)
MCH: 28.9 pg (ref 26.0–34.0)
MCHC: 34.7 g/dL (ref 30.0–36.0)
MCV: 83.3 fL (ref 78.0–100.0)
Platelets: 182 10*3/uL (ref 150–400)
RBC: 4.54 MIL/uL (ref 4.22–5.81)
RDW: 16.3 % — ABNORMAL HIGH (ref 11.5–15.5)
WBC: 8.2 10*3/uL (ref 4.0–10.5)

## 2013-11-12 LAB — BASIC METABOLIC PANEL
BUN: 20 mg/dL (ref 6–23)
CO2: 23 mEq/L (ref 19–32)
Calcium: 9.4 mg/dL (ref 8.4–10.5)
Chloride: 105 mEq/L (ref 96–112)
Creatinine, Ser: 1.11 mg/dL (ref 0.50–1.35)
GFR calc Af Amer: 64 mL/min — ABNORMAL LOW (ref >90)
GFR, EST NON AFRICAN AMERICAN: 56 mL/min — AB (ref >90)
Glucose, Bld: 98 mg/dL (ref 70–99)
POTASSIUM: 4.2 meq/L (ref 3.7–5.3)
SODIUM: 143 meq/L (ref 137–147)

## 2013-11-12 LAB — CBG MONITORING, ED
GLUCOSE-CAPILLARY: 95 mg/dL (ref 70–99)
GLUCOSE-CAPILLARY: 101 mg/dL — AB (ref 70–99)

## 2013-11-12 LAB — I-STAT TROPONIN, ED: TROPONIN I, POC: 0.04 ng/mL (ref 0.00–0.08)

## 2013-11-12 LAB — TSH: TSH: 3.54 u[IU]/mL (ref 0.350–4.500)

## 2013-11-12 NOTE — ED Notes (Signed)
Beaver, MD at bedside.  

## 2013-11-12 NOTE — Discharge Instructions (Signed)

## 2013-11-12 NOTE — ED Provider Notes (Signed)
CSN: [id number]     Arrival date & time 11/12/13  1610 History   First MD Initiated Contact with Patient 11/12/13 1732     Chief Complaint  Patient presents with  . Weakness     (Consider location/radiation/quality/duration/timing/severity/associated sxs/prior Treatment) The history is provided by the patient and a relative.   history of present illness: 80 year-old male with history of hypertension hyperlipidemia second-degree AV block type I, mitral insufficiency who presents with chief complaint of weakness. Onset of symptoms was 5 days ago. Patient reports weakness described as increased fatigue after exerting himself. Family reports that he has been out working in his yard in the afternoons and complains of fatigue after working in the yard. He denies any chest pain, shortness of breath, nausea, vomiting, diarrhea, melena, hematochezia, or urinary symptoms. He was seen in the emergency department 4 days ago laboratory evaluation at that time was unremarkable and patient was discharged home. He presents today due to continued continued symptoms. Symptoms are described as mild to moderate in severity. He denies any worsening of symptoms.  Past Medical History  Diagnosis Date  . Mitral insufficiency     CHRONIC  . Hypertension   . Hyperlipidemia   . 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 Coronary artery disease     a. Remote lateral infarct;  b. 09/2011 MV: large dense inferolat/lat scar - ? very slight peri-infarcrt isch.  No signif change c/w 09/2010 scan.  . Myocardial infarct, old   . Raynaud's disease /phenomenon   . Hard of hearing     bilaterally  . Gout   . PTSD (post-traumatic stress disorder)     "being treated for it now but not a big degree"  . Asthma   . Noncompliance with medication regimen   . Chronic systolic CHF (congestive heart failure)     a. 10/2011 Echo: EF 40-45%, inf AK, PASP 57mmHg.  . Atrial flutter    Past Surgical History  Procedure Laterality Date   . Prostatectomy    . Tonsillectomy    . Inguinal hernia repair      left  . Appendectomy    . Cardiac catheterization  2003    SHOWED OCCLUSION OF THE LEFT CIRCUMFLEX CORONARY ARTERY   Family History  Problem Relation Age of Onset  . Heart attack Brother   . Alcohol abuse Father     died in his late 7450's  . Other Mother     died in her late 7980's - old age.   History  Substance Use Topics  . Smoking status: Former Smoker -- 1.50 packs/day for 20 years    Quit date: 12/12/1970  . Smokeless tobacco: Never Used  . Alcohol Use: No    Review of Systems  Constitutional: Positive for fatigue. Negative for fever and chills.  HENT: Negative.   Eyes: Negative.   Respiratory: Negative for cough and shortness of breath.   Cardiovascular: Negative for chest pain and palpitations.  Gastrointestinal: Negative for nausea, vomiting, abdominal pain, diarrhea and constipation.  Genitourinary: Negative.  Negative for dysuria.  Musculoskeletal: Negative.   Skin: Negative.   Neurological: Negative.  Negative for dizziness and light-headedness.  All other systems reviewed and are negative.     Allergies  Zocor  Home Medications   Current Outpatient Rx  Name  Route  Sig  Dispense  Refill  . allopurinol (ZYLOPRIM) 300 MG tablet   Oral   Take 450 mg by mouth  daily.          . amLODipine (NORVASC) 5 MG tablet   Oral   Take 5 mg by mouth daily.         Marland Kitchen apixaban (ELIQUIS) 2.5 MG TABS tablet   Oral   Take 2.5 mg by mouth 2 (two) times daily.         Marland Kitchen atorvastatin (LIPITOR) 10 MG tablet   Oral   Take 10 mg by mouth daily at 6 PM.         . Cholecalciferol (VITAMIN D-3) 1000 UNITS CAPS   Oral   Take 1 capsule by mouth 2 (two) times daily.          . Coenzyme Q10 (COQ10 PO)   Oral   Take 1 capsule by mouth daily.          . finasteride (PROSCAR) 5 MG tablet   Oral   Take 5 mg by mouth daily.          . furosemide (LASIX) 20 MG tablet   Oral   Take 20 mg  by mouth daily.         . isosorbide mononitrate (IMDUR) 60 MG 24 hr tablet   Oral   Take 60 mg by mouth daily.         Marland Kitchen levothyroxine (SYNTHROID, LEVOTHROID) 50 MCG tablet   Oral   Take 50 mcg by mouth daily before breakfast.         . losartan (COZAAR) 50 MG tablet   Oral   Take 50 mg by mouth daily.         . nitroGLYCERIN (NITROSTAT) 0.4 MG SL tablet   Sublingual   Place 1 tablet (0.4 mg total) under the tongue every 5 (five) minutes as needed. x3 doses as needed for chest pain   25 tablet   3    BP 124/66  Pulse 56  Temp(Src) 97.8 F (36.6 C) (Oral)  Resp 16  SpO2 100% Physical Exam  Nursing note and vitals reviewed. Constitutional: He is oriented to person, place, and time. He appears well-developed and well-nourished. No distress.  HENT:  Head: Normocephalic and atraumatic.  Eyes: Conjunctivae are normal.  Neck: Neck supple.  Cardiovascular: Regular rhythm and intact distal pulses.  Bradycardia present.   Murmur heard.  Systolic murmur is present with a grade of 3/6  Pulmonary/Chest: Effort normal and breath sounds normal. He has no wheezes. He has no rales.  Abdominal: Soft. He exhibits no distension. There is no tenderness.  Musculoskeletal: Normal range of motion.  Neurological: He is alert and oriented to person, place, and time.  Skin: Skin is warm and dry.    ED Course  Procedures (including critical care time) Labs Review Labs Reviewed  CBC - Abnormal; Notable for the following:    HCT 37.8 (*)    RDW 16.3 (*)    All other components within normal limits  BASIC METABOLIC PANEL - Abnormal; Notable for the following:    GFR calc non Af Amer 56 (*)    GFR calc Af Amer 64 (*)    All other components within normal limits  CBG MONITORING, ED - Abnormal; Notable for the following:    Glucose-Capillary 101 (*)    All other components within normal limits  CBG MONITORING, ED   Imaging Review No results found.   EKG Interpretation None       MDM   Final diagnoses:  None    80 year old male  with history of mitral insufficiency, hypertension, hyperlipidemia, coronary artery disease, second degree type I AV block who presents with 5 days of intermittent fatigue after working in his yard. Patient with sinus bradycardia, vital signs otherwise normal. Normal exam including normal full neurologic exam.  Patient with adjustment in levothyroxine 4 weeks ago. Has not had TSH checked since that time. Labs showed patient to have an unremarkable workup with normal BMP, CBC, TSH. Troponin checked due to age, history of coronary artery disease, and possible atypical presentation for ACS. However, feel ACS very unlikely and given times course of symptoms not feel initial troponins warranted with initial negative troponin.  Feel patient's symptoms likely due to over exertion while working in his yard in the heat. With normal laboratory evaluation and normal neurologic exam feel patient appropriate for discharge with further outpatient PCP followup. Treatment plan was discussed with the patient and his family who were in agreement. Return percussions were given.    Cherre Robins, MD 11/13/13 [phone]

## 2013-11-12 NOTE — ED Notes (Signed)
He was here on Monday for weakness, family states "his heart rate was low but they were able to discharge him home." he states the weakness returned today and he just hasnt fell well since he woke up. Family decided to bring him back to ed. He is A&Ox4, resp e/u

## 2013-11-12 NOTE — ED Notes (Signed)
Patient transported to X-ray 

## 2013-11-12 NOTE — ED Notes (Signed)
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 Today again didn't have any energy, decreased appetite, Denies any pain. Attempted to start saline lock patient did't want it.

## 2013-11-13 NOTE — ED Provider Notes (Signed)
Medical screening examination/treatment/procedure(s) were conducted as a shared visit with non-physician practitioner(s) or resident  and myself.  I personally evaluated the patient during the encounter and agree with the findings and plan unless otherwise indicated.    I have personally reviewed any xrays and/ or EKG's with the provider and I agree with interpretation.   Patient presents after second episode of general weakness and fatigue the day after spending significant time doing yard work and outside in the heat.  No chest pain or shortness of breath. Similar previous episode. On exam patient well-appearing, lungs clear, abdomen soft nontender, neurologically intact, cranial nerves normal, no leg pain or leg swelling, mild dry mucous membranes.  Screening blood work, EKG and chest x-ray no acute findings. Patient feels at baseline. Close followup discussed with patient and family who agree with planned outpatient. Dg Chest 2 View  11/12/2013   CLINICAL DATA:  WEAKNESS, history of asthma.  EXAM: CHEST  2 VIEW  COMPARISON:  DG CHEST 2 VIEW dated 10/17/2012  FINDINGS: The heart size and mediastinal contours are within normal limits. Both lungs are clear. Very mild chronic interstitial changes are similar. The visualized skeletal structures are nonsuspicious ; minimal degenerative change of the thoracic spine for age.  IMPRESSION: No acute cardiopulmonary process. Similar mild chronic interstitial changes which could reflect reactive airway disease.   Electronically Signed   By: Awilda Metroourtnay  Bloomer   On: 11/12/2013 19:23    EKG Interpretation   Date/Time:  Friday November 12 2013 16:29:12 EDT Ventricular Rate:  54 PR Interval:    QRS Duration: 76 QT Interval:  474 QTC Calculation: 449 R Axis:   2 Text Interpretation:  Atrial fibrillation with slow ventricular response  Nonspecific ST and T wave abnormality Abnormal ECG Confirmed by Deann Mclaine   MD, Cala Kruckenberg ([id number]) on 11/12/2013 5:56:06 PM      General  weakness, atrial fibrillation  Enid SkeensJoshua M Khamila Bassinger, MD 11/13/13 0130

## 2013-11-15 DIAGNOSIS — I499 Cardiac arrhythmia, unspecified: Secondary | ICD-10-CM | POA: Diagnosis not present

## 2013-11-19 ENCOUNTER — Other Ambulatory Visit: Payer: Self-pay | Admitting: *Deleted

## 2013-11-19 ENCOUNTER — Encounter: Payer: Self-pay | Admitting: Radiology

## 2013-11-19 ENCOUNTER — Encounter (INDEPENDENT_AMBULATORY_CARE_PROVIDER_SITE_OTHER): Payer: Medicare Other

## 2013-11-19 DIAGNOSIS — R5383 Other fatigue: Secondary | ICD-10-CM | POA: Diagnosis not present

## 2013-11-19 DIAGNOSIS — R5381 Other malaise: Secondary | ICD-10-CM

## 2013-11-19 DIAGNOSIS — I441 Atrioventricular block, second degree: Secondary | ICD-10-CM

## 2013-11-19 NOTE — Progress Notes (Signed)
Patient ID: Craig [name], male   DOB: 07-14-1919, 80 y.o.   MRN: [id number] E cardio 30 day monitor applied

## 2013-11-25 DIAGNOSIS — R5381 Other malaise: Secondary | ICD-10-CM | POA: Diagnosis not present

## 2013-11-25 DIAGNOSIS — R5383 Other fatigue: Secondary | ICD-10-CM | POA: Diagnosis not present

## 2013-12-06 DIAGNOSIS — E039 Hypothyroidism, unspecified: Secondary | ICD-10-CM | POA: Diagnosis not present

## 2013-12-08 ENCOUNTER — Other Ambulatory Visit: Payer: Self-pay | Admitting: *Deleted

## 2013-12-08 MED ORDER — FUROSEMIDE 20 MG PO TABS: 20.0000 mg | ORAL_TABLET | Freq: Every day | ORAL | Status: DC

## 2013-12-17 ENCOUNTER — Encounter: Payer: Self-pay | Admitting: Cardiology

## 2013-12-17 ENCOUNTER — Ambulatory Visit (INDEPENDENT_AMBULATORY_CARE_PROVIDER_SITE_OTHER): Payer: Medicare Other | Admitting: Cardiology

## 2013-12-17 VITALS — BP 125/60 | HR 56 | Wt 180.0 lb

## 2013-12-17 DIAGNOSIS — I2510 Atherosclerotic heart disease of native coronary artery without angina pectoris: Secondary | ICD-10-CM

## 2013-12-17 DIAGNOSIS — I509 Heart failure, unspecified: Secondary | ICD-10-CM

## 2013-12-17 DIAGNOSIS — I4892 Unspecified atrial flutter: Secondary | ICD-10-CM | POA: Diagnosis not present

## 2013-12-17 DIAGNOSIS — R5383 Other fatigue: Secondary | ICD-10-CM | POA: Insufficient documentation

## 2013-12-17 DIAGNOSIS — I441 Atrioventricular block, second degree: Secondary | ICD-10-CM | POA: Diagnosis not present

## 2013-12-17 DIAGNOSIS — I5022 Chronic systolic (congestive) heart failure: Secondary | ICD-10-CM

## 2013-12-17 DIAGNOSIS — R5381 Other malaise: Secondary | ICD-10-CM

## 2013-12-17 NOTE — Progress Notes (Signed)
Craig [name] Date of Birth: April 11, 1919 Medical Record #[id number]  History of Present Illness: Craig [name] is seen for followup today. He has a history of CAD with chronic occlusion of the LCx- last cardiac cath in 2003. He also has a history of CHF with EF of 40-45%- last Echo in 3.14. He has moderate to severe MR managed medically. He has a history of paroxysmal atrial flutter and is on Eliquis. He has a history of Mobitz type 1 AV block when in NSR. Prior Holter monitor in 9/13 showed AV block without indication for pacemaker. Since his last visit in December he complains of episodes where he feels "blah" and very fatigued. 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 He was actually seen in the ED in March and April with Ecg showing atrial flutter with HR of 50-60. Over the past month he has worn an event monitor and tracings were reviewed today showing atrial flutter with slow ventricular response as low as 40 bpm. He denies dizziness or syncope. No chest pain. Breathing is stable.  Current Outpatient Prescriptions on File Prior to Visit  Medication Sig Dispense Refill  . allopurinol (ZYLOPRIM) 300 MG tablet Take 450 mg by mouth daily.       Marland Kitchen. amLODipine (NORVASC) 5 MG tablet Take 5 mg by mouth daily.      Marland Kitchen. apixaban (ELIQUIS) 2.5 MG TABS tablet Take 2.5 mg by mouth 2 (two) times daily.      Marland Kitchen. atorvastatin (LIPITOR) 10 MG tablet Take 10 mg by mouth daily at 6 PM.      . Cholecalciferol (VITAMIN D-3) 1000 UNITS CAPS Take 1 capsule by mouth 2 (two) times daily.       . Coenzyme Q10 (COQ10 PO) Take 1 capsule by mouth daily.       . finasteride (PROSCAR) 5 MG tablet Take 5 mg by mouth daily.       . furosemide (LASIX) 20 MG tablet Take 1 tablet (20 mg total) by mouth daily.  30 tablet  0  . isosorbide mononitrate (IMDUR) 60 MG 24 hr tablet Take 60 mg by mouth daily.      Marland Kitchen. levothyroxine (SYNTHROID, LEVOTHROID) 50 MCG tablet Take 50 mcg by mouth daily before breakfast.      . losartan  (COZAAR) 50 MG tablet Take 50 mg by mouth daily.      . nitroGLYCERIN (NITROSTAT) 0.4 MG SL tablet Place 1 tablet (0.4 mg total) under the tongue every 5 (five) minutes as needed. x3 doses as needed for chest pain  25 tablet  3   No current facility-administered medications on file prior to visit.    Allergies  Allergen Reactions  . Zocor [Simvastatin] Other (See Comments)    Unknown reaction per pt    Past Medical History  Diagnosis Date  . Mitral insufficiency     CHRONIC  . Hypertension   . Hyperlipidemia   . 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 Coronary artery disease     a. Remote lateral infarct;  b. 09/2011 MV: large dense inferolat/lat scar - ? very slight peri-infarcrt isch.  No signif change c/w 09/2010 scan.  . Myocardial infarct, old   . Raynaud's disease /phenomenon   . Hard of hearing     bilaterally  . Gout   . PTSD (post-traumatic stress disorder)     "being treated for it now but not a big degree"  . Asthma   .  Noncompliance with medication regimen   . Chronic systolic CHF (congestive heart failure)     a. 10/2011 Echo: EF 40-45%, inf AK, PASP 57mmHg.  . Atrial flutter     Past Surgical History  Procedure Laterality Date  . Prostatectomy    . Tonsillectomy    . Inguinal hernia repair      left  . Appendectomy    . Cardiac catheterization  2003    SHOWED OCCLUSION OF THE LEFT CIRCUMFLEX CORONARY ARTERY    History  Smoking status  . Former Smoker -- 1.50 packs/day for 20 years  . Quit date: 12/12/1970  Smokeless tobacco  . Never Used    History  Alcohol Use No    Family History  Problem Relation Age of Onset  . Heart attack Brother   . Alcohol abuse Father     died in his late 2250's  . Other Mother     died in her late 4380's - old age.    Review of Systems: As noted in history of present illness  All other systems were reviewed and are negative.  Physical Exam: BP 125/60  Pulse 56  Wt 180 lb (81.647 kg) He is an elderly white  male, pleasant, in no distress. He is balding. He wears a hearing aid. Pupils are equal round and reactive. Sclera are clear. Oropharynx is clear. Neck is without JVD, adenopathy, thyromegaly, or bruits. Lungs are clear to auscultation and percussion. Cardiac exam reveals a regular rate and rhythm with a grade 2/6 systolic murmur at the apex. Abdomen is soft and nontender without organomegaly or masses. Extremities are without edema or cyanosis. Pulses are 2+. Skin is warm and dry. Neurologic exam reveals no focal abnormalities. Mood is appropriate.  LABORATORY DATA: Event monitor as noted.  Lab Results  Component Value Date   WBC 8.2 11/12/2013   HGB 13.1 11/12/2013   HCT 37.8* 11/12/2013   PLT 182 11/12/2013   GLUCOSE 98 11/12/2013   CHOL 131 08/29/2011   TRIG 75 08/29/2011   HDL 46 08/29/2011   LDLCALC 70 08/29/2011   ALT 25 08/29/2011   AST 15 08/29/2011   NA 143 11/12/2013   K 4.2 11/12/2013   CL 105 11/12/2013   CREATININE 1.11 11/12/2013   BUN 20 11/12/2013   CO2 23 11/12/2013   TSH 3.540 11/12/2013   INR 1.08 08/29/2011   HGBA1C 6.5* 08/29/2011    Assessment / Plan:  1. Coronary disease with chronic occlusion of the left circumflex coronary. No significant anginal symptoms. Continue amlodipine and isosorbide.  2. History of Mobitz type I second-degree AV block.   3. Congestive heart failure chronic systolic, clinically well compensated. Ejection fraction of 40-45%and unchanged from one year before.  He is euvolemic.  4. Mitral insufficiency. Moderate to severe.  5. Hypertension.   6. Atrial flutter-rate with slow ventricular response. Clearly symptomatic.  On prophylactic anticoagulation with Eliquis. Recommend referral to EP for pacemaker placement. He may be a good candidate for a leadless device. No indication for ICD or CRT and I would not consider this at his advanced age.

## 2013-12-29 ENCOUNTER — Other Ambulatory Visit: Payer: Self-pay | Admitting: Cardiology

## 2014-01-03 ENCOUNTER — Other Ambulatory Visit: Payer: Self-pay | Admitting: Cardiology

## 2014-01-07 ENCOUNTER — Ambulatory Visit (INDEPENDENT_AMBULATORY_CARE_PROVIDER_SITE_OTHER): Payer: Medicare Other | Admitting: Internal Medicine

## 2014-01-07 ENCOUNTER — Encounter: Payer: Self-pay | Admitting: Internal Medicine

## 2014-01-07 VITALS — BP 158/80 | HR 58 | Ht 70.5 in | Wt 185.0 lb

## 2014-01-07 DIAGNOSIS — I4891 Unspecified atrial fibrillation: Secondary | ICD-10-CM | POA: Diagnosis not present

## 2014-01-07 DIAGNOSIS — I498 Other specified cardiac arrhythmias: Secondary | ICD-10-CM | POA: Diagnosis not present

## 2014-01-07 DIAGNOSIS — I4892 Unspecified atrial flutter: Secondary | ICD-10-CM

## 2014-01-07 DIAGNOSIS — I340 Nonrheumatic mitral (valve) insufficiency: Secondary | ICD-10-CM

## 2014-01-07 DIAGNOSIS — R001 Bradycardia, unspecified: Secondary | ICD-10-CM

## 2014-01-07 NOTE — Patient Instructions (Signed)
Your physician recommends that you schedule a follow-up appointment as needed  

## 2014-01-10 ENCOUNTER — Encounter: Payer: Self-pay | Admitting: Internal Medicine

## 2014-01-10 DIAGNOSIS — R001 Bradycardia, unspecified: Secondary | ICD-10-CM | POA: Insufficient documentation

## 2014-01-10 DIAGNOSIS — I4891 Unspecified atrial fibrillation: Secondary | ICD-10-CM | POA: Insufficient documentation

## 2014-01-10 NOTE — Progress Notes (Signed)
Primary Care Physician: Lolita PatellaEADE,ROBERT ALEXANDER, MD Referring Physician:  Dr SwazilandJordan   Craig [name] is a 80 y.o. male with a h/o persistent atrial fibrillation and atrial flutter as well as bradycardia who presents today for EP consultation.  He has moderate to severe valvular heart disease but is not felt to be a candidate for interventions.  He remains active for his age.  At times, he feels fatigued however at other times he feels quite well.  He recently wore an event monitor which revealed afib with episodic bradycardia.  There were no prolonged pauses.  Ventricular rates were not significantly elevated.  Today, he denies symptoms of palpitations, chest pain, shortness of breath, orthopnea, PND, lower extremity edema, dizziness, presyncope, syncope, or neurologic sequela. The patient is tolerating medications without difficulties and is otherwise without complaint today.   Past Medical History  Diagnosis Date  . Mitral insufficiency     chronically moderate to severe  . Hypertension   . Hyperlipidemia   . 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 Coronary artery disease     a. Remote lateral infarct;  b. 09/2011 MV: large dense inferolat/lat scar - ? very slight peri-infarcrt isch.  No signif change c/w 09/2010 scan.  . Myocardial infarct, old   . Raynaud's disease /phenomenon   . Hard of hearing     bilaterally  . Gout   . PTSD (post-traumatic stress disorder)     "being treated for it now but not a big degree"  . Asthma   . Noncompliance with medication regimen   . Chronic systolic CHF (congestive heart failure)     a. 10/2011 Echo: EF 40-45%, inf AK, PASP 57mmHg.  Marland Kitchen. Persistent atrial fibrillation    Past Surgical History  Procedure Laterality Date  . Prostatectomy    . Tonsillectomy    . Inguinal hernia repair      left  . Appendectomy    . Cardiac catheterization  2003    SHOWED OCCLUSION OF THE LEFT CIRCUMFLEX CORONARY ARTERY    Current Outpatient Prescriptions    Medication Sig Dispense Refill  . allopurinol (ZYLOPRIM) 300 MG tablet Take 450 mg by mouth daily.       Marland Kitchen. amLODipine (NORVASC) 5 MG tablet Take 5 mg by mouth daily.      Marland Kitchen. apixaban (ELIQUIS) 2.5 MG TABS tablet Take 2.5 mg by mouth 2 (two) times daily.      Marland Kitchen. atorvastatin (LIPITOR) 10 MG tablet Take 10 mg by mouth daily at 6 PM.      . Cholecalciferol (VITAMIN D-3) 1000 UNITS CAPS Take 1 capsule by mouth 2 (two) times daily.       . Coenzyme Q10 (COQ10 PO) Take 1 capsule by mouth daily.       . finasteride (PROSCAR) 5 MG tablet Take 5 mg by mouth daily.       . furosemide (LASIX) 20 MG tablet TAKE 2 TABLETS (40 MG TOTAL) BY MOUTH DAILY.      . isosorbide mononitrate (IMDUR) 60 MG 24 hr tablet Take 60 mg by mouth daily.      Marland Kitchen. levothyroxine (SYNTHROID, LEVOTHROID) 50 MCG tablet Take 50 mcg by mouth daily before breakfast.      . losartan (COZAAR) 50 MG tablet Take 50 mg by mouth daily.      . nitroGLYCERIN (NITROSTAT) 0.4 MG SL tablet Place 1 tablet (0.4 mg total) under the tongue every 5 (five) minutes as needed. x3  doses as needed for chest pain  25 tablet  3   No current facility-administered medications for this visit.    Allergies  Allergen Reactions  . Zocor [Simvastatin] Other (See Comments)    Unknown reaction per pt    History   Social History  . Marital Status: Married    Spouse Name: N/A    Number of Children: 8  . Years of Education: N/A   Occupational History  . Chief Technology Officer    Social History Main Topics  . Smoking status: Former Smoker -- 1.50 packs/day for 20 years    Quit date: 12/12/1970  . Smokeless tobacco: Never Used  . Alcohol Use: No  . Drug Use: No  . Sexual Activity: No   Other Topics Concern  . Not on file   Social History Narrative   Lives in [place] - currently by himself as his wife is in a nsg home.  He is active and visits his wife multiple x/day.    Family History  Problem Relation Age of Onset  . Heart attack Brother   .  Alcohol abuse Father     died in his late 82's  . Other Mother     died in her late 67's - old age.    ROS- All systems are reviewed and negative except as per the HPI above  Physical Exam: Filed Vitals:   01/07/14 0905  BP: 158/80  Pulse: 58  Height: 5' 10.5" (1.791 m)  Weight: 185 lb (83.915 kg)    GEN- The patient is elderly appearing, alert and oriented x 3 today.   Head- normocephalic, atraumatic Eyes-  Sclera clear, conjunctiva pink Ears- hearing intact Oropharynx- clear Neck- supple  Lungs- Clear to ausculation bilaterally, normal work of breathing Heart- irregular rate and rhythm, 3/6 holosystolic murmur at the apex GI- soft, NT, ND, + BS Extremities- no clubbing, cyanosis, + edema MS- age appropriate muscle atrophy Skin- no rash or lesion Psych- euthymic mood, full affect Neuro- strength and sensation are intact  Recent event monitor is reviewed Echo is reviewed Dr Illa Level records are reviewed  Assessment and Plan:  1. Persistent atrial fibrillation in the setting of atrial enlargement and moderate to severe MR Would continue our current strategy of rate control chads2vasc score is at least 5.  He is taking eliquis as per Dr Swaziland  2. Symptomatic bradycardia The patient has mild to moderately symptomatic bradycardia.  I think that PPM implant is reasonable.  Given PA pressure > 40 and advanced age, he is not a candidate for leadless pacing. Risks, benefits, alternatives to a traditional pacemaker were discussed in detail with the patient today. The patient understands that the risks and wishes to avoid PPM at this time.  Given his preferences, I think that this is reasonable though he may eventually require PPM.   He will follow-up with Dr Swaziland.  Should he develop presyncope, syncope, or symptoms of further decline in AV conduction the he should contact our office at that time.

## 2014-01-12 ENCOUNTER — Other Ambulatory Visit (HOSPITAL_COMMUNITY): Payer: Self-pay | Admitting: Cardiology

## 2014-01-12 ENCOUNTER — Other Ambulatory Visit: Payer: Self-pay

## 2014-01-12 MED ORDER — FUROSEMIDE 20 MG PO TABS: ORAL_TABLET | ORAL | Status: DC

## 2014-01-31 DIAGNOSIS — S8000XA Contusion of unspecified knee, initial encounter: Secondary | ICD-10-CM | POA: Diagnosis not present

## 2014-01-31 DIAGNOSIS — I4891 Unspecified atrial fibrillation: Secondary | ICD-10-CM | POA: Diagnosis not present

## 2014-01-31 DIAGNOSIS — Z0000 Encounter for general adult medical examination without abnormal findings: Secondary | ICD-10-CM | POA: Diagnosis not present

## 2014-01-31 DIAGNOSIS — Z1331 Encounter for screening for depression: Secondary | ICD-10-CM | POA: Diagnosis not present

## 2014-01-31 DIAGNOSIS — R5381 Other malaise: Secondary | ICD-10-CM | POA: Diagnosis not present

## 2014-01-31 DIAGNOSIS — R5383 Other fatigue: Secondary | ICD-10-CM | POA: Diagnosis not present

## 2014-02-11 ENCOUNTER — Other Ambulatory Visit: Payer: Self-pay | Admitting: Cardiology

## 2014-03-17 ENCOUNTER — Emergency Department (HOSPITAL_COMMUNITY)
Admission: EM | Admit: 2014-03-17 | Discharge: 2014-03-18 | Discharge status: Against Medical Advice | Payer: Medicare Other | Attending: Emergency Medicine | Admitting: Emergency Medicine

## 2014-03-17 ENCOUNTER — Encounter (HOSPITAL_COMMUNITY): Payer: Self-pay | Admitting: Emergency Medicine

## 2014-03-17 ENCOUNTER — Emergency Department (HOSPITAL_COMMUNITY): Payer: Medicare Other

## 2014-03-17 DIAGNOSIS — I498 Other specified cardiac arrhythmias: Secondary | ICD-10-CM | POA: Diagnosis not present

## 2014-03-17 DIAGNOSIS — Z7902 Long term (current) use of antithrombotics/antiplatelets: Secondary | ICD-10-CM | POA: Diagnosis not present

## 2014-03-17 DIAGNOSIS — J45901 Unspecified asthma with (acute) exacerbation: Secondary | ICD-10-CM | POA: Diagnosis not present

## 2014-03-17 DIAGNOSIS — Z9889 Other specified postprocedural states: Secondary | ICD-10-CM | POA: Insufficient documentation

## 2014-03-17 DIAGNOSIS — E785 Hyperlipidemia, unspecified: Secondary | ICD-10-CM | POA: Diagnosis not present

## 2014-03-17 DIAGNOSIS — H9190 Unspecified hearing loss, unspecified ear: Secondary | ICD-10-CM | POA: Insufficient documentation

## 2014-03-17 DIAGNOSIS — I2510 Atherosclerotic heart disease of native coronary artery without angina pectoris: Secondary | ICD-10-CM | POA: Insufficient documentation

## 2014-03-17 DIAGNOSIS — R5381 Other malaise: Secondary | ICD-10-CM | POA: Insufficient documentation

## 2014-03-17 DIAGNOSIS — R5383 Other fatigue: Secondary | ICD-10-CM | POA: Diagnosis not present

## 2014-03-17 DIAGNOSIS — I10 Essential (primary) hypertension: Secondary | ICD-10-CM | POA: Diagnosis not present

## 2014-03-17 DIAGNOSIS — Z87891 Personal history of nicotine dependence: Secondary | ICD-10-CM | POA: Insufficient documentation

## 2014-03-17 DIAGNOSIS — Z79899 Other long term (current) drug therapy: Secondary | ICD-10-CM | POA: Insufficient documentation

## 2014-03-17 DIAGNOSIS — Z8659 Personal history of other mental and behavioral disorders: Secondary | ICD-10-CM | POA: Diagnosis not present

## 2014-03-17 DIAGNOSIS — I5022 Chronic systolic (congestive) heart failure: Secondary | ICD-10-CM | POA: Insufficient documentation

## 2014-03-17 DIAGNOSIS — I4891 Unspecified atrial fibrillation: Secondary | ICD-10-CM | POA: Diagnosis not present

## 2014-03-17 DIAGNOSIS — I252 Old myocardial infarction: Secondary | ICD-10-CM | POA: Diagnosis not present

## 2014-03-17 DIAGNOSIS — I7300 Raynaud's syndrome without gangrene: Secondary | ICD-10-CM | POA: Insufficient documentation

## 2014-03-17 DIAGNOSIS — M6281 Muscle weakness (generalized): Secondary | ICD-10-CM | POA: Diagnosis not present

## 2014-03-17 DIAGNOSIS — R531 Weakness: Secondary | ICD-10-CM

## 2014-03-17 DIAGNOSIS — R001 Bradycardia, unspecified: Secondary | ICD-10-CM

## 2014-03-17 LAB — CBC
HCT: 41.9 % (ref 39.0–52.0)
Hemoglobin: 14.3 g/dL (ref 13.0–17.0)
MCH: 29.5 pg (ref 26.0–34.0)
MCHC: 34.1 g/dL (ref 30.0–36.0)
MCV: 86.4 fL (ref 78.0–100.0)
PLATELETS: 195 10*3/uL (ref 150–400)
RBC: 4.85 MIL/uL (ref 4.22–5.81)
RDW: 15.5 % (ref 11.5–15.5)
WBC: 11.4 10*3/uL — ABNORMAL HIGH (ref 4.0–10.5)

## 2014-03-17 LAB — I-STAT TROPONIN, ED: Troponin i, poc: 0.05 ng/mL (ref 0.00–0.08)

## 2014-03-17 LAB — BASIC METABOLIC PANEL
ANION GAP: 16 — AB (ref 5–15)
BUN: 25 mg/dL — AB (ref 6–23)
CALCIUM: 9.3 mg/dL (ref 8.4–10.5)
CO2: 22 mEq/L (ref 19–32)
CREATININE: 1.4 mg/dL — AB (ref 0.50–1.35)
Chloride: 103 mEq/L (ref 96–112)
GFR calc Af Amer: 48 mL/min — ABNORMAL LOW (ref >90)
GFR, EST NON AFRICAN AMERICAN: 42 mL/min — AB (ref >90)
Glucose, Bld: 115 mg/dL — ABNORMAL HIGH (ref 70–99)
Potassium: 4.6 mEq/L (ref 3.7–5.3)
Sodium: 141 mEq/L (ref 137–147)

## 2014-03-17 MED ORDER — ASPIRIN 325 MG PO TABS
325.0000 mg | ORAL_TABLET | Freq: Once | ORAL | Status: AC
  Administered 2014-03-18: 325 mg via ORAL
  Filled 2014-03-17: qty 1

## 2014-03-17 NOTE — ED Notes (Signed)
Pt states he is here with weakness and thinks it is his heart.  Pt states no chest pain or irregular heart beat.

## 2014-03-17 NOTE — ED Notes (Signed)
Pt continues to be monitored by 5 lead, blood pressure and pulse ox.  

## 2014-03-17 NOTE — ED Provider Notes (Signed)
CSN: [id number]     Arrival date & time 03/17/14  1733 History   First MD Initiated Contact with Patient 03/17/14 2305     Chief Complaint  Patient presents with  . Weakness     (Consider location/radiation/quality/duration/timing/severity/associated sxs/prior Treatment) HPI  Craig [name] is a 80yo male with a past medical history of persistent atrial fibrillation and atrial flutter, hypertension, hyperlipidemia, and CHF here complaining of weakness occurring for the past several months. Patient states he has 4-5 good days followed by one bad day where he feels diffusely weak throughout. This has occurred for months. Patient states his cardiologist is aware of this and he has worn a Holter monitor which revealed he has episodes of symptomatic bradycardia. Pacemaker for the patient was discussed however patient refused the surgery due to the risk of his age. Patient is very active working in his yard up to 5 hours per day for his 3 acres. His family is concerned that he is working too much. He is accompanied by his daughter and niece. He denies fevers, recent infections cough or blood in the stool. He has had no chest pain no shortness of breath.  Past Medical History  Diagnosis Date  . Mitral insufficiency     chronically moderate to severe  . Hypertension   . Hyperlipidemia   . 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 Coronary artery disease     a. Remote lateral infarct;  b. 09/2011 MV: large dense inferolat/lat scar - ? very slight peri-infarcrt isch.  No signif change c/w 09/2010 scan.  . Myocardial infarct, old   . Raynaud's disease /phenomenon   . Hard of hearing     bilaterally  . Gout   . PTSD (post-traumatic stress disorder)     "being treated for it now but not a big degree"  . Asthma   . Noncompliance with medication regimen   . Chronic systolic CHF (congestive heart failure)     a. 10/2011 Echo: EF 40-45%, inf AK, PASP .  Marland Kitchen Persistent atrial fibrillation    Past  Surgical History  Procedure Laterality Date  . Prostatectomy    . Tonsillectomy    . Inguinal hernia repair      left  . Appendectomy    . Cardiac catheterization  2003    SHOWED OCCLUSION OF THE LEFT CIRCUMFLEX CORONARY ARTERY   Family History  Problem Relation Age of Onset  . Heart attack Brother   . Alcohol abuse Father     died in his late 7's  . Other Mother     died in her late 75's - old age.   History  Substance Use Topics  . Smoking status: Former Smoker -- 1.50 packs/day for 20 years    Quit date: 12/12/1970  . Smokeless tobacco: Never Used  . Alcohol Use: No    Review of Systems 10 Systems reviewed and are negative for acute change except as noted in the HPI.    Allergies  Zocor  Home Medications   Prior to Admission medications   Medication Sig Start Date End Date Taking? Authorizing Provider  allopurinol (ZYLOPRIM) 300 MG tablet Take 450 mg by mouth daily.    Yes Historical Provider, MD  amLODipine (NORVASC) 5 MG tablet Take 5 mg by mouth daily.   Yes Historical Provider, MD  apixaban (ELIQUIS) 2.5 MG TABS tablet Take 2.5 mg by mouth 2 (two) times daily.   Yes Historical Provider, MD  atorvastatin (LIPITOR) 10 MG tablet Take 10 mg by mouth daily at 6 PM.   Yes Historical Provider, MD  Cholecalciferol (VITAMIN D-3) 1000 UNITS CAPS Take 1,000 Units by mouth 2 (two) times daily.    Yes Historical Provider, MD  Coenzyme Q10 (COQ10 PO) Take 1 capsule by mouth daily.    Yes Historical Provider, MD  finasteride (PROSCAR) 5 MG tablet Take 5 mg by mouth daily.    Yes Historical Provider, MD  furosemide (LASIX) 20 MG tablet Take 40 mg by mouth daily.   Yes Historical Provider, MD  isosorbide mononitrate (IMDUR) 60 MG 24 hr tablet Take 60 mg by mouth daily.   Yes Historical Provider, MD  levothyroxine (SYNTHROID, LEVOTHROID) 50 MCG tablet Take 50 mcg by mouth daily before breakfast.   Yes Historical Provider, MD  losartan (COZAAR) 50 MG tablet Take 50 mg by mouth  daily.   Yes Historical Provider, MD  nitroGLYCERIN (NITROSTAT) 0.4 MG SL tablet Place 1 tablet (0.4 mg total) under the tongue every 5 (five) minutes as needed. x3 doses as needed for chest pain 10/18/12  Yes Ok Anishristopher R Berge, NP   BP 145/68  Pulse 56  Temp(Src) 97.7 F (36.5 C) (Oral)  Resp 15  Wt 181 lb (82.101 kg)  SpO2 97% Physical Exam  Nursing note and vitals reviewed. Constitutional: He is oriented to person, place, and time. Vital signs are normal. He appears well-developed and well-nourished.  Non-toxic appearance. He does not appear ill. No distress.  HENT:  Head: Normocephalic and atraumatic.  Nose: Nose normal.  Mouth/Throat: Oropharynx is clear and moist. No oropharyngeal exudate.  Eyes: Conjunctivae and EOM are normal. Pupils are equal, round, and reactive to light. No scleral icterus.  Neck: Normal range of motion. Neck supple. No tracheal deviation, no edema, no erythema and normal range of motion present. No mass and no thyromegaly present.  Cardiovascular: Regular rhythm, S1 normal, S2 normal, normal heart sounds, intact distal pulses and normal pulses.  Exam reveals no gallop and no friction rub.   Pulses:      Radial pulses are 2+ on the right side, and 2+ on the left side.       Dorsalis pedis pulses are 2+ on the right side, and 2+ on the left side.  Bradycardia noted  Pulmonary/Chest: Effort normal and breath sounds normal. No respiratory distress. He has no wheezes. He has no rhonchi. He has no rales.  Abdominal: Soft. Normal appearance and bowel sounds are normal. He exhibits no distension, no ascites and no mass. There is no hepatosplenomegaly. There is no tenderness. There is no rebound, no guarding and no CVA tenderness.  Musculoskeletal: Normal range of motion. He exhibits no edema and no tenderness.  Lymphadenopathy:    He has no cervical adenopathy.  Neurological: He is alert and oriented to person, place, and time. He has normal strength. No cranial  nerve deficit or sensory deficit. He exhibits normal muscle tone. Coordination normal. GCS eye subscore is 4. GCS verbal subscore is 5. GCS motor subscore is 6.  Skin: Skin is warm, dry and intact. No petechiae and no rash noted. He is not diaphoretic. No erythema. No pallor.  Psychiatric: He has a normal mood and affect. His behavior is normal. Judgment normal.    ED Course  Procedures (including critical care time) Labs Review Labs Reviewed  CBC - Abnormal; Notable for the following:    WBC 11.4 (*)    All other components within normal limits  BASIC METABOLIC PANEL - Abnormal; Notable for the following:    Glucose, Bld 115 (*)    BUN 25 (*)    Creatinine, Ser 1.40 (*)    GFR calc non Af Amer 42 (*)    GFR calc Af Amer 48 (*)    Anion gap 16 (*)    All other components within normal limits  URINALYSIS, ROUTINE W REFLEX MICROSCOPIC - Abnormal; Notable for the following:    Protein, ur 30 (*)    All other components within normal limits  URINE MICROSCOPIC-ADD ON - Abnormal; Notable for the following:    Casts HYALINE CASTS (*)    All other components within normal limits  CK  TROPONIN I  I-STAT TROPOININ, ED    Imaging Review No results found.   EKG Interpretation   Date/Time:  Thursday March 17 2014 23:43:02 EDT Ventricular Rate:  49 PR Interval:    QRS Duration: 91 QT Interval:  446 QTC Calculation: 403 R Axis:   -29 Text Interpretation:  Atrial flutter with bradycardia Left axis deviation  No significant change since last tracing Confirmed by Erroll Luna [id number]) on 03/17/2014 11:51:34 PM      MDM   Final diagnoses:  None    Patient states he has been weak for months. States he has 4 good days and alive one day of general weakness. His cardiologist Dr. Swaziland is aware of this. He previously worn a monitor revealing symptomatic bradycardia. Patient has refused a pacemaker in the past. I spoke with Dr.Friedman today regarding possible inpatient  pacemaker placement. At the time with the patient patient is refusing admission and surgery at this time. Because of this patient will leave the emergency department against medical device. I spoke with him the risks of going home and having an episode of symptomatic bradycardia and the possible risk of death. She is okay with this risk and is electing to go home at this time.  Dr. Zachery Conch is putting in a note to get an earlier appointment with Dr.Allrad for followup. Family also states an appointment with Dr. Swaziland next week. They're strongly encouraged to make both of these appointments. Signs remained stable. Baseline heart rate is 50-60. Patient is okay to leave AGAINST MEDICAL ADVICE.    Tomasita Crumble, MD 03/18/14 [phone]

## 2014-03-17 NOTE — ED Notes (Signed)
Pt placed into gown and on monitor upon arrival to room. Pt monitored by blood pressure, 5 lead, pulse ox.

## 2014-03-17 NOTE — ED Notes (Signed)
Pt reports he is a bit weak feeling.  Normally has weak times but feels he is not recovering fast enough today. Mowed lawn yesterday.  [facility]  Came home, ate, watched tv, then laid down.  Woke up and called daughter saying he is not feeling well so has been here since.  No marked deficits noted.  Is unable to pinpoint how he feels.

## 2014-03-18 DIAGNOSIS — R5381 Other malaise: Secondary | ICD-10-CM | POA: Diagnosis not present

## 2014-03-18 DIAGNOSIS — R5383 Other fatigue: Secondary | ICD-10-CM | POA: Diagnosis not present

## 2014-03-18 LAB — URINALYSIS, ROUTINE W REFLEX MICROSCOPIC
Bilirubin Urine: NEGATIVE
GLUCOSE, UA: NEGATIVE mg/dL
HGB URINE DIPSTICK: NEGATIVE
KETONES UR: NEGATIVE mg/dL
Leukocytes, UA: NEGATIVE
Nitrite: NEGATIVE
PROTEIN: 30 mg/dL — AB
Specific Gravity, Urine: 1.015 (ref 1.005–1.030)
Urobilinogen, UA: 0.2 mg/dL (ref 0.0–1.0)
pH: 5.5 (ref 5.0–8.0)

## 2014-03-18 LAB — URINE MICROSCOPIC-ADD ON

## 2014-03-18 LAB — TROPONIN I: Troponin I: <0.3 ng/mL (ref <0.30)

## 2014-03-18 LAB — CK: Total CK: 73 U/L (ref 7–232)

## 2014-03-18 NOTE — Discharge Instructions (Signed)
Craig [name] Craig [name], you were seen today for weakness.  Your lab test came back within normal limits and at your baseline.  This is likely the on-going problem with your heart.  We recommend admission and pacemaker placement for you.  You are leaving against medical advise with the possibility of you heart slowing down to a rate where you could die.  Please follow up with Dr. Breck [name] and Dr. SwazilandJordan as soon as possible for continued care.  If any symptoms worsen, return to the ED immediately for evaluation. Thank you Craig [name] is a term for a heart rate (pulse) that, in adults, is slower than 60 beats per minute. A normal rate is 60 to 100 beats per minute. A heart rate below 60 beats per minute may be normal for some adults with healthy hearts. If the rate is too slow, the heart may have trouble pumping the volume of blood the body needs. If the heart rate gets too low, blood flow to the brain may be decreased and may make you feel lightheaded, dizzy, or faint. The heart has a natural pacemaker in the top of the heart called the SA node (sinoatrial or sinus node). This pacemaker sends out regular electrical signals to the muscle of the heart, telling the heart muscle when to beat (contract). The electrical signal travels from the upper parts of the heart (atria) through the AV node (atrioventricular node), to the lower chambers of the heart (ventricles). The ventricles squeeze, pumping the blood from your heart to your lungs and to the rest of your body. CAUSES   Problem with the heart's electrical system.  Problem with the heart's natural pacemaker.  Heart disease, damage, or infection.  Medications.  Problems with minerals and salts (electrolytes). SYMPTOMS   Fainting (syncope).  Fatigue and weakness.  Shortness of breath (dyspnea).  Chest pain (angina).  Drowsiness.  Confusion. DIAGNOSIS   An electrocardiogram (ECG) can help your caregiver determine the type of slow heart rate  you have.  If the cause is not seen on an ECG, you may need to wear a heart monitor that records your heart rhythm for several hours or days.  Blood tests. TREATMENT   Electrolyte supplements.  Medications.  Withholding medication which is causing a slow heart rate.  Pacemaker placement. SEEK IMMEDIATE MEDICAL CARE IF:   You feel lightheaded or faint.  You develop an irregular heart rate.  You feel chest pain or have trouble breathing. MAKE SURE YOU:   Understand these instructions.  Will watch your condition.  Will get help right away if you are not doing well or get worse. Document Released: 04/13/2002 Document Revised: 10/14/2011 Document Reviewed: 10/27/2013 [facility] Patient Information 2015 [place]. This information is not intended to replace advice given to you by your health care provider. Make sure you discuss any questions you have with your health care provider. Discharge Against Medical Advice I am signing this paper to show that I am leaving this hospital or health care center of my own free will. It is done against all medical advice. In doing so, I am releasing this hospital or health care center and the attending physicians from any and all claims that I may want to make. I understand that further care has been recommended. My condition may worsen. This could cause me further bodily injury, illness, or even death. I do know that the medical staff has fully explained to me the risk that I am taking in leaving against medical advice. Document  Released: 07/22/2005 Document Revised: 10/14/2011 Document Reviewed: 01/06/2007 [facility] Patient Information 2015 [place], [place]. This information is not intended to replace advice given to you by your health care provider. Make sure you discuss any questions you have with your health care provider.

## 2014-03-18 NOTE — ED Notes (Signed)
Pt decided he is no longer wanting treatment, asking to leave now and is leaving AMA.

## 2014-03-20 ENCOUNTER — Emergency Department (HOSPITAL_COMMUNITY)
Admission: EM | Admit: 2014-03-20 | Discharge: 2014-03-20 | Disposition: A | Discharge status: Home / Self Care | Payer: Medicare Other | Attending: Emergency Medicine | Admitting: Emergency Medicine

## 2014-03-20 ENCOUNTER — Encounter (HOSPITAL_COMMUNITY): Payer: Self-pay | Admitting: Emergency Medicine

## 2014-03-20 DIAGNOSIS — I498 Other specified cardiac arrhythmias: Secondary | ICD-10-CM | POA: Insufficient documentation

## 2014-03-20 DIAGNOSIS — Z9889 Other specified postprocedural states: Secondary | ICD-10-CM | POA: Diagnosis not present

## 2014-03-20 DIAGNOSIS — Z8669 Personal history of other diseases of the nervous system and sense organs: Secondary | ICD-10-CM | POA: Diagnosis not present

## 2014-03-20 DIAGNOSIS — I5022 Chronic systolic (congestive) heart failure: Secondary | ICD-10-CM | POA: Diagnosis not present

## 2014-03-20 DIAGNOSIS — E785 Hyperlipidemia, unspecified: Secondary | ICD-10-CM | POA: Insufficient documentation

## 2014-03-20 DIAGNOSIS — R5383 Other fatigue: Secondary | ICD-10-CM | POA: Diagnosis not present

## 2014-03-20 DIAGNOSIS — R5381 Other malaise: Secondary | ICD-10-CM | POA: Insufficient documentation

## 2014-03-20 DIAGNOSIS — I252 Old myocardial infarction: Secondary | ICD-10-CM | POA: Insufficient documentation

## 2014-03-20 DIAGNOSIS — I4891 Unspecified atrial fibrillation: Secondary | ICD-10-CM | POA: Diagnosis not present

## 2014-03-20 DIAGNOSIS — Z9119 Patient's noncompliance with other medical treatment and regimen: Secondary | ICD-10-CM | POA: Diagnosis not present

## 2014-03-20 DIAGNOSIS — I10 Essential (primary) hypertension: Secondary | ICD-10-CM | POA: Diagnosis not present

## 2014-03-20 DIAGNOSIS — Z91199 Patient's noncompliance with other medical treatment and regimen due to unspecified reason: Secondary | ICD-10-CM | POA: Insufficient documentation

## 2014-03-20 DIAGNOSIS — I482 Chronic atrial fibrillation, unspecified: Secondary | ICD-10-CM

## 2014-03-20 DIAGNOSIS — Z79899 Other long term (current) drug therapy: Secondary | ICD-10-CM | POA: Insufficient documentation

## 2014-03-20 DIAGNOSIS — J45909 Unspecified asthma, uncomplicated: Secondary | ICD-10-CM | POA: Insufficient documentation

## 2014-03-20 DIAGNOSIS — I2510 Atherosclerotic heart disease of native coronary artery without angina pectoris: Secondary | ICD-10-CM | POA: Insufficient documentation

## 2014-03-20 DIAGNOSIS — F4310 Post-traumatic stress disorder, unspecified: Secondary | ICD-10-CM | POA: Insufficient documentation

## 2014-03-20 DIAGNOSIS — R011 Cardiac murmur, unspecified: Secondary | ICD-10-CM | POA: Insufficient documentation

## 2014-03-20 DIAGNOSIS — Z7901 Long term (current) use of anticoagulants: Secondary | ICD-10-CM | POA: Diagnosis not present

## 2014-03-20 DIAGNOSIS — R001 Bradycardia, unspecified: Secondary | ICD-10-CM

## 2014-03-20 DIAGNOSIS — Z87891 Personal history of nicotine dependence: Secondary | ICD-10-CM | POA: Diagnosis not present

## 2014-03-20 NOTE — ED Provider Notes (Signed)
CSN: [id number]     Arrival date & time 03/20/14  1644 History   First MD Initiated Contact with Patient 03/20/14 1706     Chief Complaint  Patient presents with  . Weakness     (Consider location/radiation/quality/duration/timing/severity/associated sxs/prior Treatment) Patient is a 80 y.o. male presenting with weakness. The history is provided by the patient.  Weakness Pertinent negatives include no chest pain, no abdominal pain, no headaches and no shortness of breath.   patient has had some worsening fatigue over the last several days. Seen in the ER 3 days ago for the same. Lab reassuring that time. He does have a history of symptomatic bradycardia and had been suggested to get a pacemaker, however he is refuses in the past, but states he now decided that he wants it. No passing out. No chest pain. He states he is unable to multiple guard is much as before. He states he uses a riding mower and mows his acre  Past Medical History  Diagnosis Date  . Mitral insufficiency     chronically moderate to severe  . Hypertension   . Hyperlipidemia   . 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 Coronary artery disease     a. Remote lateral infarct;  b. 09/2011 MV: large dense inferolat/lat scar - ? very slight peri-infarcrt isch.  No signif change c/w 09/2010 scan.  . Myocardial infarct, old   . Raynaud's disease /phenomenon   . Hard of hearing     bilaterally  . Gout   . PTSD (post-traumatic stress disorder)     "being treated for it now but not a big degree"  . Asthma   . Noncompliance with medication regimen   . Chronic systolic CHF (congestive heart failure)     a. 10/2011 Echo: EF 40-45%, inf AK, PASP .  Marland Kitchen Persistent atrial fibrillation    Past Surgical History  Procedure Laterality Date  . Prostatectomy    . Tonsillectomy    . Inguinal hernia repair      left  . Appendectomy    . Cardiac catheterization  2003    SHOWED OCCLUSION OF THE LEFT CIRCUMFLEX CORONARY ARTERY    Family History  Problem Relation Age of Onset  . Heart attack Brother   . Alcohol abuse Father     died in his late 100's  . Other Mother     died in her late 40's - old age.   History  Substance Use Topics  . Smoking status: Former Smoker -- 1.50 packs/day for 20 years    Quit date: 12/12/1970  . Smokeless tobacco: Never Used  . Alcohol Use: No    Review of Systems  Constitutional: Positive for fatigue. Negative for activity change and appetite change.  Eyes: Negative for pain.  Respiratory: Negative for chest tightness and shortness of breath.   Cardiovascular: Negative for chest pain and leg swelling.  Gastrointestinal: Negative for nausea, vomiting, abdominal pain and diarrhea.  Genitourinary: Negative for flank pain.  Musculoskeletal: Negative for back pain and neck stiffness.  Skin: Negative for rash.  Neurological: Positive for weakness. Negative for numbness and headaches.  Psychiatric/Behavioral: Negative for behavioral problems.      Allergies  Zocor  Home Medications   Prior to Admission medications   Medication Sig Start Date End Date Taking? Authorizing Provider  allopurinol (ZYLOPRIM) 300 MG tablet Take 450 mg by mouth daily.     Historical Provider, MD  amLODipine (NORVASC) 5  MG tablet Take 5 mg by mouth daily.    Historical Provider, MD  apixaban (ELIQUIS) 2.5 MG TABS tablet Take 2.5 mg by mouth 2 (two) times daily.    Historical Provider, MD  atorvastatin (LIPITOR) 10 MG tablet Take 10 mg by mouth daily at 6 PM.    Historical Provider, MD  Cholecalciferol (VITAMIN D-3) 1000 UNITS CAPS Take 1,000 Units by mouth 2 (two) times daily.     Historical Provider, MD  Coenzyme Q10 (COQ10 PO) Take 1 capsule by mouth daily.     Historical Provider, MD  finasteride (PROSCAR) 5 MG tablet Take 5 mg by mouth daily.     Historical Provider, MD  furosemide (LASIX) 20 MG tablet Take 40 mg by mouth daily.    Historical Provider, MD  isosorbide mononitrate (IMDUR) 60 MG  24 hr tablet Take 60 mg by mouth daily.    Historical Provider, MD  levothyroxine (SYNTHROID, LEVOTHROID) 50 MCG tablet Take 50 mcg by mouth daily before breakfast.    Historical Provider, MD  losartan (COZAAR) 50 MG tablet Take 50 mg by mouth daily.    Historical Provider, MD  nitroGLYCERIN (NITROSTAT) 0.4 MG SL tablet Place 1 tablet (0.4 mg total) under the tongue every 5 (five) minutes as needed. x3 doses as needed for chest pain 10/18/12   Ok Anis, NP   BP 117/65  Pulse 52  Temp(Src) 97.9 F (36.6 C) (Oral)  Resp 18  Ht 5\' 10"  (1.778 m)  Wt 180 lb (81.647 kg)  BMI 25.83 kg/m2  SpO2 100% Physical Exam  Nursing note and vitals reviewed. Constitutional: He is oriented to person, place, and time. He appears well-developed and well-nourished.  Patient appears younger than stated age  HENT:  Head: Normocephalic and atraumatic.  Eyes: EOM are normal. Pupils are equal, round, and reactive to light.  Neck: Normal range of motion. Neck supple.  Cardiovascular:  Murmur heard. Mild irregular bradycardia  Pulmonary/Chest: Effort normal and breath sounds normal.  Abdominal: Soft. Bowel sounds are normal. He exhibits no distension and no mass. There is no tenderness. There is no rebound and no guarding.  Musculoskeletal: Normal range of motion. He exhibits no edema.  Neurological: He is alert and oriented to person, place, and time. No cranial nerve deficit.  Skin: Skin is warm and dry.  Psychiatric: He has a normal mood and affect.    ED Course  Procedures (including critical care time) Labs Review Labs Reviewed - No data to display  Imaging Review No results found.   EKG Interpretation   Date/Time:  Sunday March 20 2014 16:48:55 EDT Ventricular Rate:  174 PR Interval:  78 QRS Duration: 148 QT Interval:  144 QTC Calculation: 245 R Axis:   -106 Text Interpretation:  atrial flutter Non-specific intra-ventricular  conduction block Possible Right ventricular  hypertrophy Possible Lateral  infarct , age undetermined Possible Inferior infarct , age undetermined  Abnormal ECG Confirmed by Rubin Payor  MD, Harrold Donath [phone]) on 03/20/2014  5:08:10 PM      MDM   Final diagnoses:  Symptomatic bradycardia  Chronic atrial fibrillation    Patient with symptomatic bradycardia. Has been having episodes of it for months. Seen in ER 3 days ago and discussed with cardiology who arranged followup. The ER doctor may leave AMA in the note on the discharge said return to prevent death. Patient states he now read it and is ready to have his pacemaker. I discussed with cardiology, who is aware the patient from Friday.  He is not appear to be a risk for arrest. He has had on monitoring previously episodes of bradycardia down to the 40s, but no pauses. He has followup with his primary cardiologist, Dr. [place]Jordan on Wednesday and he will keep this appointment. Rates in the ER have been mostly in the 50s and 60s with some dips into the upper 40s    Jisel Fleet R. Rubin PayorPickering, MD 03/20/14 [phone]

## 2014-03-20 NOTE — ED Notes (Signed)
Pt seen here on the 13th and was advised to stay in the hospital to get a pacemaker. Pt did not want to stay at that time. Pt wants to stay today.

## 2014-03-23 ENCOUNTER — Ambulatory Visit (INDEPENDENT_AMBULATORY_CARE_PROVIDER_SITE_OTHER): Payer: Medicare Other | Admitting: Cardiology

## 2014-03-23 ENCOUNTER — Encounter: Payer: Self-pay | Admitting: Cardiology

## 2014-03-23 VITALS — BP 140/68 | HR 56 | Ht 70.0 in | Wt 183.0 lb

## 2014-03-23 DIAGNOSIS — I498 Other specified cardiac arrhythmias: Secondary | ICD-10-CM

## 2014-03-23 DIAGNOSIS — R001 Bradycardia, unspecified: Secondary | ICD-10-CM

## 2014-03-23 DIAGNOSIS — I509 Heart failure, unspecified: Secondary | ICD-10-CM

## 2014-03-23 DIAGNOSIS — I2510 Atherosclerotic heart disease of native coronary artery without angina pectoris: Secondary | ICD-10-CM | POA: Diagnosis not present

## 2014-03-23 DIAGNOSIS — I483 Typical atrial flutter: Secondary | ICD-10-CM

## 2014-03-23 DIAGNOSIS — I5022 Chronic systolic (congestive) heart failure: Secondary | ICD-10-CM

## 2014-03-23 DIAGNOSIS — I4892 Unspecified atrial flutter: Secondary | ICD-10-CM

## 2014-03-23 NOTE — Patient Instructions (Signed)
We will make arrangements for you to have a permanent pacemaker placed.

## 2014-03-24 ENCOUNTER — Telehealth: Payer: Self-pay | Admitting: *Deleted

## 2014-03-24 NOTE — Telephone Encounter (Addendum)
Called and spoke with pt's dtr to arrange PPM insertion. Scheduled for 03/28/14 with Dr. Graciela HusbandsKlein. Arrive at hospital at 8:00 a.m. Pre procedure labs to be done at hospital. Wound check scheduled for 9/3. Reviewed instructions with dtr: NPO after MN, no Furosemide morning of procedure. Dtr verbalized understanding of instructions.

## 2014-03-24 NOTE — Progress Notes (Signed)
[name] Date of Birth: [birth year] Medical Record #[id number]  History of Present Illness: [name] is seen for followup today. He has a history of CAD with chronic occlusion of the LCx- last cardiac cath in 2003. He also has a history of CHF with EF of 40-45%- last Echo in 3.14. He has moderate to severe MR managed medically. He has a history of atrial flutter and is on Eliquis. He has a history of Mobitz type 1 AV block when in NSR. Prior Holter monitor in 9/13 showed AV block without indication for pacemaker. Since  December he complains of episodes where he feels "blah" and very fatigued. 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  In May he wore an event monitor  showing atrial flutter with slow ventricular response as low as 40 bpm. He denies dizziness or syncope. No chest pain. Breathing is stable. He was seen by Dr. Johney Frame in June for consideration of a pacemaker but he declined having this done. Over the past week he has been to the ED twice with symptoms of marked fatigue and weakness. Again he was in atrial flutter with rates as low as 45. He states he wants to have a pacemaker now.   Current Outpatient Prescriptions on File Prior to Visit  Medication Sig Dispense Refill  . allopurinol (ZYLOPRIM) 300 MG tablet Take 450 mg by mouth daily.       Marland Kitchen amLODipine (NORVASC) 5 MG tablet Take 5 mg by mouth daily.      Marland Kitchen apixaban (ELIQUIS) 2.5 MG TABS tablet Take 2.5 mg by mouth 2 (two) times daily.      Marland Kitchen atorvastatin (LIPITOR) 10 MG tablet Take 10 mg by mouth daily at 6 PM.      . Cholecalciferol (VITAMIN D-3) 1000 UNITS CAPS Take 1,000 Units by mouth 2 (two) times daily.       . Coenzyme Q10 (COQ10 PO) Take 1 capsule by mouth daily.       . finasteride (PROSCAR) 5 MG tablet Take 5 mg by mouth daily.       . furosemide (LASIX) 20 MG tablet Take 40 mg by mouth daily.      . isosorbide mononitrate (IMDUR) 60 MG 24 hr tablet Take 60 mg by mouth daily.      Marland Kitchen levothyroxine  (SYNTHROID, LEVOTHROID) 50 MCG tablet Take 50 mcg by mouth daily before breakfast.      . losartan (COZAAR) 50 MG tablet Take 50 mg by mouth daily.      . nitroGLYCERIN (NITROSTAT) 0.4 MG SL tablet Place 1 tablet (0.4 mg total) under the tongue every 5 (five) minutes as needed. x3 doses as needed for chest pain  25 tablet  3   No current facility-administered medications on file prior to visit.    Allergies  Allergen Reactions  . Zocor [Simvastatin] Other (See Comments)    Unknown reaction per pt    Past Medical History  Diagnosis Date  . Mitral insufficiency     chronically moderate to severe  . Hypertension   . Hyperlipidemia   . 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 Coronary artery disease     a. Remote lateral infarct;  b. 09/2011 MV: large dense inferolat/lat scar - ? very slight peri-infarcrt isch.  No signif change c/w 09/2010 scan.  . Myocardial infarct, old   . Raynaud's disease /phenomenon   . Hard of hearing  bilaterally  . Gout   . PTSD (post-traumatic stress disorder)     "being treated for it now but not a big degree"  . Asthma   . Noncompliance with medication regimen   . Chronic systolic CHF (congestive heart failure)     a. 10/2011 Echo: EF 40-45%, inf AK, PASP 57mmHg.  Marland Kitchen. Persistent atrial fibrillation     Past Surgical History  Procedure Laterality Date  . Prostatectomy    . Tonsillectomy    . Inguinal hernia repair      left  . Appendectomy    . Cardiac catheterization  2003    SHOWED OCCLUSION OF THE LEFT CIRCUMFLEX CORONARY ARTERY    History  Smoking status  . Former Smoker -- 1.50 packs/day for 20 years  . Quit date: 12/12/1970  Smokeless tobacco  . Never Used    History  Alcohol Use No    Family History  Problem Relation Age of Onset  . Heart attack Brother   . Alcohol abuse Father     died in his late 8150's  . Other Mother     died in her late 1680's - old age.    Review of Systems: As noted in history of present illness   All other systems were reviewed and are negative.  Physical Exam: BP 140/68  Pulse 56  Ht 5\' 10"  (1.778 m)  Wt 183 lb (83.008 kg)  BMI 26.26 kg/m2 He is an elderly white male, pleasant, in no distress. He is balding. He wears a hearing aid. Pupils are equal round and reactive. Sclera are clear. Oropharynx is clear. Neck is without JVD, adenopathy, thyromegaly, or bruits. Lungs are clear to auscultation and percussion. Cardiac exam reveals a regular rate and rhythm with a grade 2/6 systolic murmur at the apex. Abdomen is soft and nontender without organomegaly or masses. Extremities are without edema or cyanosis. Pulses are 2+. Skin is warm and dry. Neurologic exam reveals no focal abnormalities. Mood is appropriate.  LABORATORY DATA: Event monitor as noted.  Lab Results  Component Value Date   WBC 11.4* 03/17/2014   HGB 14.3 03/17/2014   HCT 41.9 03/17/2014   PLT 195 03/17/2014   GLUCOSE 115* 03/17/2014   CHOL 131 08/29/2011   TRIG 75 08/29/2011   HDL 46 08/29/2011   LDLCALC 70 08/29/2011   ALT 25 08/29/2011   AST 15 08/29/2011   NA 141 03/17/2014   K 4.6 03/17/2014   CL 103 03/17/2014   CREATININE 1.40* 03/17/2014   BUN 25* 03/17/2014   CO2 22 03/17/2014   TSH 3.540 11/12/2013   INR 1.08 08/29/2011   HGBA1C 6.5* 08/29/2011   Ecgs from the ED reviewed- atrial flutter with slow response.   Assessment / Plan:  1. Coronary disease with chronic occlusion of the left circumflex coronary. No significant anginal symptoms. Continue amlodipine and isosorbide.  2. History of Mobitz type I second-degree AV block.   3. Congestive heart failure chronic systolic, clinically well compensated. Ejection fraction of 40-45%and unchanged from one year before.  He is euvolemic.  4. Mitral insufficiency. Moderate to severe.  5. Hypertension.   6. Atrial flutter-rate with slow ventricular response. Clearly symptomatic.  On prophylactic anticoagulation with Eliquis. Recommend proceeding with permanent  pacemaker implant. Probably single lead only. Will arrange for Dr. Graciela HusbandsKlein on Friday. Risks reviewed with patient and family and they are agreeable.

## 2014-03-27 DIAGNOSIS — I441 Atrioventricular block, second degree: Secondary | ICD-10-CM | POA: Diagnosis not present

## 2014-03-27 DIAGNOSIS — I7300 Raynaud's syndrome without gangrene: Secondary | ICD-10-CM | POA: Diagnosis not present

## 2014-03-27 DIAGNOSIS — I5022 Chronic systolic (congestive) heart failure: Secondary | ICD-10-CM | POA: Diagnosis not present

## 2014-03-27 DIAGNOSIS — I498 Other specified cardiac arrhythmias: Secondary | ICD-10-CM | POA: Diagnosis not present

## 2014-03-27 DIAGNOSIS — I252 Old myocardial infarction: Secondary | ICD-10-CM | POA: Diagnosis not present

## 2014-03-27 DIAGNOSIS — I509 Heart failure, unspecified: Secondary | ICD-10-CM | POA: Diagnosis not present

## 2014-03-27 DIAGNOSIS — E785 Hyperlipidemia, unspecified: Secondary | ICD-10-CM | POA: Diagnosis not present

## 2014-03-27 DIAGNOSIS — I059 Rheumatic mitral valve disease, unspecified: Secondary | ICD-10-CM | POA: Diagnosis not present

## 2014-03-27 DIAGNOSIS — M109 Gout, unspecified: Secondary | ICD-10-CM | POA: Diagnosis not present

## 2014-03-27 DIAGNOSIS — I2510 Atherosclerotic heart disease of native coronary artery without angina pectoris: Secondary | ICD-10-CM | POA: Diagnosis not present

## 2014-03-27 DIAGNOSIS — I4892 Unspecified atrial flutter: Secondary | ICD-10-CM | POA: Diagnosis not present

## 2014-03-27 DIAGNOSIS — F4310 Post-traumatic stress disorder, unspecified: Secondary | ICD-10-CM | POA: Diagnosis not present

## 2014-03-27 DIAGNOSIS — Z87891 Personal history of nicotine dependence: Secondary | ICD-10-CM | POA: Diagnosis not present

## 2014-03-27 DIAGNOSIS — J45909 Unspecified asthma, uncomplicated: Secondary | ICD-10-CM | POA: Diagnosis not present

## 2014-03-27 DIAGNOSIS — I4891 Unspecified atrial fibrillation: Secondary | ICD-10-CM | POA: Diagnosis not present

## 2014-03-27 DIAGNOSIS — I10 Essential (primary) hypertension: Secondary | ICD-10-CM | POA: Diagnosis not present

## 2014-03-27 MED ORDER — SODIUM CHLORIDE 0.9 % IR SOLN
80.0000 mg | Status: DC
  Filled 2014-03-27: qty 2

## 2014-03-27 MED ORDER — SODIUM CHLORIDE 0.9 % IV SOLN
INTRAVENOUS | Status: DC
  Administered 2014-03-28: 09:00:00 via INTRAVENOUS

## 2014-03-27 MED ORDER — MUPIROCIN 2 % EX OINT
TOPICAL_OINTMENT | Freq: Once | CUTANEOUS | Status: AC
  Filled 2014-03-27: qty 22

## 2014-03-27 MED ORDER — CHLORHEXIDINE GLUCONATE 4 % EX LIQD
60.0000 mL | Freq: Once | CUTANEOUS | Status: DC
  Filled 2014-03-27: qty 60

## 2014-03-27 MED ORDER — CEFAZOLIN SODIUM-DEXTROSE 2-3 GM-% IV SOLR: 2.0000 g | INTRAVENOUS | Status: DC

## 2014-03-28 ENCOUNTER — Encounter (HOSPITAL_COMMUNITY): Payer: Self-pay | Admitting: *Deleted

## 2014-03-28 ENCOUNTER — Ambulatory Visit (HOSPITAL_COMMUNITY)
Admission: RE | Admit: 2014-03-28 | Discharge: 2014-03-29 | Disposition: A | Discharge status: Home / Self Care | Payer: Medicare Other | Source: Ambulatory Visit | Attending: Internal Medicine | Admitting: Internal Medicine

## 2014-03-28 ENCOUNTER — Encounter (HOSPITAL_COMMUNITY)
Admission: RE | Disposition: A | Discharge status: Home / Self Care | Payer: Self-pay | Source: Ambulatory Visit | Attending: Internal Medicine

## 2014-03-28 DIAGNOSIS — I4891 Unspecified atrial fibrillation: Secondary | ICD-10-CM | POA: Insufficient documentation

## 2014-03-28 DIAGNOSIS — I252 Old myocardial infarction: Secondary | ICD-10-CM | POA: Insufficient documentation

## 2014-03-28 DIAGNOSIS — I2510 Atherosclerotic heart disease of native coronary artery without angina pectoris: Secondary | ICD-10-CM | POA: Insufficient documentation

## 2014-03-28 DIAGNOSIS — I441 Atrioventricular block, second degree: Secondary | ICD-10-CM | POA: Insufficient documentation

## 2014-03-28 DIAGNOSIS — I10 Essential (primary) hypertension: Secondary | ICD-10-CM | POA: Insufficient documentation

## 2014-03-28 DIAGNOSIS — M109 Gout, unspecified: Secondary | ICD-10-CM | POA: Insufficient documentation

## 2014-03-28 DIAGNOSIS — E785 Hyperlipidemia, unspecified: Secondary | ICD-10-CM | POA: Insufficient documentation

## 2014-03-28 DIAGNOSIS — I5022 Chronic systolic (congestive) heart failure: Secondary | ICD-10-CM | POA: Insufficient documentation

## 2014-03-28 DIAGNOSIS — Z87891 Personal history of nicotine dependence: Secondary | ICD-10-CM | POA: Insufficient documentation

## 2014-03-28 DIAGNOSIS — I4892 Unspecified atrial flutter: Secondary | ICD-10-CM | POA: Insufficient documentation

## 2014-03-28 DIAGNOSIS — J45909 Unspecified asthma, uncomplicated: Secondary | ICD-10-CM | POA: Insufficient documentation

## 2014-03-28 DIAGNOSIS — I498 Other specified cardiac arrhythmias: Secondary | ICD-10-CM

## 2014-03-28 DIAGNOSIS — F4310 Post-traumatic stress disorder, unspecified: Secondary | ICD-10-CM | POA: Insufficient documentation

## 2014-03-28 DIAGNOSIS — I7300 Raynaud's syndrome without gangrene: Secondary | ICD-10-CM | POA: Insufficient documentation

## 2014-03-28 DIAGNOSIS — I059 Rheumatic mitral valve disease, unspecified: Secondary | ICD-10-CM | POA: Insufficient documentation

## 2014-03-28 DIAGNOSIS — R001 Bradycardia, unspecified: Secondary | ICD-10-CM | POA: Diagnosis present

## 2014-03-28 DIAGNOSIS — I509 Heart failure, unspecified: Secondary | ICD-10-CM | POA: Insufficient documentation

## 2014-03-28 HISTORY — PX: PACEMAKER INSERTION: SHX728

## 2014-03-28 HISTORY — PX: PERMANENT PACEMAKER INSERTION: SHX5480

## 2014-03-28 LAB — SURGICAL PCR SCREEN
MRSA, PCR: NEGATIVE
Staphylococcus aureus: NEGATIVE

## 2014-03-28 SURGERY — PERMANENT PACEMAKER INSERTION
Anesthesia: LOCAL

## 2014-03-28 MED ORDER — LOSARTAN POTASSIUM 50 MG PO TABS
50.0000 mg | ORAL_TABLET | Freq: Every day | ORAL | Status: DC
  Administered 2014-03-28 – 2014-03-29 (×2): 50 mg via ORAL
  Filled 2014-03-28 (×2): qty 1

## 2014-03-28 MED ORDER — MUPIROCIN 2 % EX OINT
TOPICAL_OINTMENT | CUTANEOUS | Status: AC
  Filled 2014-03-28: qty 22

## 2014-03-28 MED ORDER — LIDOCAINE HCL (PF) 1 % IJ SOLN
INTRAMUSCULAR | Status: AC
  Filled 2014-03-28: qty 30

## 2014-03-28 MED ORDER — FUROSEMIDE 40 MG PO TABS
40.0000 mg | ORAL_TABLET | Freq: Every day | ORAL | Status: DC
  Administered 2014-03-28 – 2014-03-29 (×2): 40 mg via ORAL
  Filled 2014-03-28 (×2): qty 1

## 2014-03-28 MED ORDER — ISOSORBIDE MONONITRATE ER 60 MG PO TB24
60.0000 mg | ORAL_TABLET | Freq: Every day | ORAL | Status: DC
  Administered 2014-03-28 – 2014-03-29 (×2): 60 mg via ORAL
  Filled 2014-03-28 (×2): qty 1

## 2014-03-28 MED ORDER — LEVOTHYROXINE SODIUM 50 MCG PO TABS
50.0000 ug | ORAL_TABLET | Freq: Every day | ORAL | Status: DC
  Administered 2014-03-28 – 2014-03-29 (×2): 50 ug via ORAL
  Filled 2014-03-28 (×3): qty 1

## 2014-03-28 MED ORDER — AMLODIPINE BESYLATE 5 MG PO TABS
5.0000 mg | ORAL_TABLET | Freq: Every day | ORAL | Status: DC
  Administered 2014-03-28 – 2014-03-29 (×2): 5 mg via ORAL
  Filled 2014-03-28 (×2): qty 1

## 2014-03-28 MED ORDER — ONDANSETRON HCL 4 MG/2ML IJ SOLN
4.0000 mg | Freq: Four times a day (QID) | INTRAMUSCULAR | Status: DC | PRN
Start: 2014-03-28 — End: 2014-03-29

## 2014-03-28 MED ORDER — FINASTERIDE 5 MG PO TABS
5.0000 mg | ORAL_TABLET | Freq: Every day | ORAL | Status: DC
  Administered 2014-03-28 – 2014-03-29 (×2): 5 mg via ORAL
  Filled 2014-03-28 (×2): qty 1

## 2014-03-28 MED ORDER — MIDAZOLAM HCL 5 MG/5ML IJ SOLN
INTRAMUSCULAR | Status: AC
  Filled 2014-03-28: qty 5

## 2014-03-28 MED ORDER — CEFAZOLIN SODIUM 1-5 GM-% IV SOLN
1.0000 g | Freq: Four times a day (QID) | INTRAVENOUS | Status: AC
  Administered 2014-03-28 – 2014-03-29 (×3): 1 g via INTRAVENOUS
  Filled 2014-03-28 (×3): qty 50

## 2014-03-28 MED ORDER — HEPARIN (PORCINE) IN NACL 2-0.9 UNIT/ML-% IJ SOLN
INTRAMUSCULAR | Status: AC
  Filled 2014-03-28: qty 500

## 2014-03-28 MED ORDER — ALLOPURINOL 300 MG PO TABS
450.0000 mg | ORAL_TABLET | Freq: Every day | ORAL | Status: DC
  Administered 2014-03-28 – 2014-03-29 (×2): 450 mg via ORAL
  Filled 2014-03-28 (×2): qty 1

## 2014-03-28 MED ORDER — ACETAMINOPHEN 325 MG PO TABS: 325.0000 mg | ORAL_TABLET | ORAL | Status: DC | PRN

## 2014-03-28 MED ORDER — YOU HAVE A PACEMAKER BOOK
Freq: Once | Status: AC
Start: 2014-03-28 — End: 2014-03-28
  Administered 2014-03-28: 18:00:00
  Filled 2014-03-28: qty 1

## 2014-03-28 MED ORDER — ATORVASTATIN CALCIUM 10 MG PO TABS
10.0000 mg | ORAL_TABLET | Freq: Every day | ORAL | Status: DC
  Administered 2014-03-28: 10 mg via ORAL
  Filled 2014-03-28 (×2): qty 1

## 2014-03-28 MED ORDER — SODIUM CHLORIDE 0.9 % IV SOLN
INTRAVENOUS | Status: AC
  Administered 2014-03-28: 14:00:00 via INTRAVENOUS

## 2014-03-28 MED ORDER — FENTANYL CITRATE 0.05 MG/ML IJ SOLN
INTRAMUSCULAR | Status: AC
  Filled 2014-03-28: qty 2

## 2014-03-28 MED ADMIN — Mupirocin Oint 2%: NASAL | @ 09:00:00

## 2014-03-28 NOTE — Discharge Summary (Signed)
ELECTROPHYSIOLOGY PROCEDURE DISCHARGE SUMMARY    Patient ID: Craig [name],  MRN: [id number], DOB/AGE: 1918/09/08 80 y.o.  Admit date: 03/28/2014 Discharge date: 03/29/2014  Primary Care Physician: [name], MD Primary Cardiologist: [place] Electrophysiologist: Graciela Husbands  Primary Discharge Diagnosis:  Symptomatic bradycardia status post pacemaker implantation this admission  Secondary Discharge Diagnosis:  1.  Permanent atrial fibrillation 2.  Hypertension 3.  CAD - a. Remote lateral infarct; b. 09/2011 MV: large dense inferolat/lat scar - ? very slight peri-infarcrt isch. No signif change c/w 09/2010 scan 4.  Mitral valve insufficiency 5.  Raynaud's disease 6.  Chronic systolic heart failure  6.  PTSD  Allergies  Allergen Reactions  . Zocor [Simvastatin] Other (See Comments)    Unknown reaction per pt     Procedures This Admission:  1.  Implantation of a STJ single chamber pacemaker on 03-28-14 by Dr Graciela Husbands.  The patient received a STJ model number 1240 pacemaker with model number 1948 right ventricular lead. There were no early apparent complications.  2.  CXR on 03-29-14 demonstrated stable lead, no ptx  Brief HPI: Craig [name] is a 80 y.o. male with a past medical history as outlined above.  He has had progressive bradycardia associated with fatigue and exercise intolerance. He was referred to EP for treatment options.  Risks, benefits, and alternatives to pacemaker implantation were reviewed with the patient who wished to proceed.   Hospital Course:  The patient was admitted and underwent implantation of a STJ single chamber pacemaker with details as outlined above.   He was monitored on telemetry overnight which demonstrated atrial fibrillation with ventricular pacing.  Left chest was without hematoma or ecchymosis.  The device was interrogated and found to be functioning normally.  CXR was obtained and demonstrated no pneumothorax status post device  implantation.  Wound care, arm mobility, and restrictions were reviewed with the patient.  Dr Johney Frame examined the patient and considered them stable for discharge to home.    Discharge Vitals: Blood pressure 139/76, pulse 60, temperature 97.5 F (36.4 C), temperature source Oral, resp. rate 20, height  (1.778 m), weight 185 lb 13.6 oz (84.3 kg), SpO2 98.00%.  Physical Exam: Filed Vitals:   03/28/14 1600 03/28/14 1937 03/29/14 0028 03/29/14 0559  BP: 160/81 123/67 125/72 139/76  Pulse: 69 60 62 60  Temp: 98 F (36.7 C) 98.4 F (36.9 C) 98 F (36.7 C) 97.5 F (36.4 C)  TempSrc: Oral Oral Oral Oral  Resp: Height:      Weight:   185 lb 13.6 oz (84.3 kg)   SpO2: 92% 99% 94% 98%    GEN- The patient is elderly appearing, alert and oriented x 3 today.   Head- normocephalic, atraumatic Eyes-  Sclera clear, conjunctiva pink Ears- poor hearing Oropharynx- clear Neck- supple, Lungs- Clear to ausculation bilaterally, normal work of breathing Heart- Regular rate and rhythm, no murmurs, rubs or gallops, PMI not laterally displaced GI- soft, NT, ND, + BS Extremities- no clubbing, cyanosis, or edema, groin is without hematoma/ bruit Skin- pacemaker pocket is without hematoma   Labs:   Lab Results  Component Value Date   WBC 11.4* 03/17/2014   HGB 14.3 03/17/2014   HCT 41.9 03/17/2014   MCV 86.4 03/17/2014   PLT 195 03/17/2014     Discharge Medications:    Medication List    ASK your doctor about these medications       allopurinol 300 MG tablet  Commonly known as:  ZYLOPRIM  Take 450 mg by mouth daily.     amLODipine 5 MG tablet  Commonly known as:  NORVASC  Take 5 mg by mouth daily.     atorvastatin 10 MG tablet  Commonly known as:  LIPITOR  Take 10 mg by mouth daily at 6 PM.     COQ10 PO  Take 1 capsule by mouth daily.     ELIQUIS 2.5 MG Tabs tablet  Generic drug:  apixaban  Take 2.5 mg by mouth 2 (two) times daily.     finasteride 5 MG tablet    Commonly known as:  PROSCAR  Take 5 mg by mouth daily.     furosemide 20 MG tablet  Commonly known as:  LASIX  Take 40 mg by mouth daily.     isosorbide mononitrate 60 MG 24 hr tablet  Commonly known as:  IMDUR  Take 60 mg by mouth daily.     levothyroxine 50 MCG tablet  Commonly known as:  SYNTHROID, LEVOTHROID  Take 50 mcg by mouth daily before breakfast.     losartan 50 MG tablet  Commonly known as:  COZAAR  Take 50 mg by mouth daily.     nitroGLYCERIN 0.4 MG SL tablet  Commonly known as:  NITROSTAT  Place 1 tablet (0.4 mg total) under the tongue every 5 (five) minutes as needed. x3 doses as needed for chest pain     Vitamin D-3 1000 UNITS Caps  Take 1,000 Units by mouth 2 (two) times daily.        Disposition:     Duration of Discharge Encounter: Greater than 30 minutes including physician time.  Signed,  Hillis Range MD

## 2014-03-28 NOTE — CV Procedure (Signed)
CALDER OBLINGER [id number]  [id number]  Preop Dx: symptomatic bradycardia on no contributing meds Postop Dx same/   Cx: none apparent    Procedure single pacemaker implantation  After routine prep and drape, lidocaine was infiltrated in the prepectoral subclavicular region on the left side an incision was made and carried down to later the prepectoral fascia using electrocautery and sharp dissection a pocket was formed similarly. Hemostasis was obtained.  After this, we turned our attention to gaining accessm to the extrathoracic,left subclavian vein. This was accomplished without difficulty and without the aspiration of air or puncture of the artery. 2 separate venipunctures were accomplished; guidewires were placed and retained and sequentially 7 French sheath through which were  passed an [facility] passive ventricular lead serial number [id number]  .  The ventricular lead was manipulated to the right ventricular apex with a bipolar R wave was 5.7,  the pacing impedance was 690, the threshold was 0.5 @ 0.4 msec   The lead was affixed to the prepectoral fascia and attached to a  [employer] 1240 pulse generator  Serial number [id number] .  Hemostasis was obtained. The pocket was copiously irrigated with antibiotic containing saline solution. The leads and the pulse generator were placed in the pocket and affixed to the prepectoral fascia. The wound iwas then closed in 2  layers in normal fashion. The wound was washed dried and a DERMABOND dressing was applied    Needle count, sponge count  and instrument counts were correct at the end of the procedure .Marland Kitchen The patient tolerated the procedure without apparent complication.

## 2014-03-28 NOTE — H&P (Signed)
History and Physical  Patient ID: Craig [name] MRN: [id number], SOB: Apr 24, 1919 80 y.o. Date of Encounter: 03/28/2014, 11:51 AM  Primary Physician: [name], MD Primary Cardiologist: PJ Primary Electrophysiologist:  new  Chief Complaint:  Symptomatic bradycardia   History of Present Illness: Craig [name] is a 80 y.o. male  With persistent atrial flutter and recurrent prolonged episodes of symptomatic bradycardia with HR in 40s  Atrial fib and flutter have both   He has chronic MR HTN and remote MI with EF 45-50%  Denies chest pain shortness of breath  No edema  No syncope  On apixoban.       Past Medical History  Diagnosis Date  . Mitral insufficiency     chronically moderate to severe  . Hypertension   . Hyperlipidemia   . 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 Coronary artery disease     a. Remote lateral infarct;  b. 09/2011 MV: large dense inferolat/lat scar - ? very slight peri-infarcrt isch.  No signif change c/w 09/2010 scan.  . Myocardial infarct, old   . Raynaud's disease /phenomenon   . Hard of hearing     bilaterally  . Gout   . PTSD (post-traumatic stress disorder)     "being treated for it now but not a big degree"  . Asthma   . Noncompliance with medication regimen   . Chronic systolic CHF (congestive heart failure)     a. 10/2011 Echo: EF 40-45%, inf AK, PASP .  Marland Kitchen Persistent atrial fibrillation      Past Surgical History  Procedure Laterality Date  . Prostatectomy    . Tonsillectomy    . Inguinal hernia repair      left  . Appendectomy    . Cardiac catheterization  2003    SHOWED OCCLUSION OF THE LEFT CIRCUMFLEX CORONARY ARTERY      Current Facility-Administered Medications  Medication Dose Route Frequency Provider Last Rate Last Dose  . 0.9 %  sodium chloride infusion   Intravenous Continuous Duke Salvia, MD 50 mL/hr at 03/28/14 0831    . ceFAZolin (ANCEF) IVPB 2 g/50 mL premix  2 g Intravenous On  Call Duke Salvia, MD      . chlorhexidine (HIBICLENS) 4 % liquid 4 application  60 mL Topical Once Duke Salvia, MD      . gentamicin (GARAMYCIN) 80 mg in sodium chloride irrigation 0.9 % 500 mL irrigation  80 mg Irrigation On Call Duke Salvia, MD         Allergies: Allergies  Allergen Reactions  . Zocor [Simvastatin] Other (See Comments)    Unknown reaction per pt     History  Substance Use Topics  . Smoking status: Former Smoker -- 1.50 packs/day for 20 years    Quit date: 12/12/1970  . Smokeless tobacco: Never Used  . Alcohol Use: No      Family History  Problem Relation Age of Onset  . Heart attack Brother   . Alcohol abuse Father     died in his late 54's  . Other Mother     died in her late 77's - old age.      ROS:  Please see the history of present illness.     All other systems reviewed and negative.   Vital Signs: Blood pressure 152/82, pulse 55, temperature 98.1 F (36.7 C), temperature source Oral, resp. rate 18, height  (1.778  m), weight 185 lb (83.915 kg), SpO2 100.00%.  PHYSICAL EXAM: General:  Well nourished, well developed male in no acute distress HEENT: normal Lymph: no adenopathy Neck: no JVD Endocrine:  No thryomegaly Vascular: No carotid bruits; FA pulses 2+ bilaterally without bruits Cardiac:  normal S1, S2; slow  RRR; 2/6 systolic m Back: without kyphosis/scoliosis, no CVA tenderness Lungs:  clear to auscultation bilaterally, no wheezing, rhonchi or rales Abd: soft, nontender, no hepatomegaly Ext: no edema Musculoskeletal:  No deformities, BUE and BLE strength normal and equal Skin: warm and dry Neuro:  CNs 2-12 intact, no focal abnormalities noted Psych:  Normal affect   EKG:  Atrial flutter with HR 50  Labs:   Lab Results  Component Value Date   WBC 11.4* 03/17/2014   HGB 14.3 03/17/2014   HCT 41.9 03/17/2014   MCV 86.4 03/17/2014   PLT 195 03/17/2014   No results found for this basename: NA, K, CL, CO2, BUN,  CREATININE, CALCIUM, LABALBU, PROT, BILITOT, ALKPHOS, ALT, AST, GLUCOSE,  in the last 168 hours No results found for this basename: CKTOTAL, CKMB, TROPONINI,  in the last 72 hours Lab Results  Component Value Date   CHOL 131 08/29/2011   HDL 46 08/29/2011   LDLCALC 70 08/29/2011   TRIG 75 08/29/2011   Lab Results  Component Value Date   DDIMER 0.92* 08/29/2011   BNP Pro B Natriuretic peptide (BNP)  Date/Time Value Ref Range Status  10/17/2012  1:50 PM 1098.0* 0 - 450 pg/mL Final  10/20/2011 10:31 AM 1611.0* 0 - 450 pg/mL Final  08/29/2011  3:33 PM 1670.0* 0 - 450 pg/mL Final       ASSESSMENT AND PLAN:   Symptomatic bradycardia with atrial arrhtyhmia-permanent  Mitral Regurgitation  Severe LAE (51/2.5)  We have discussed the potential benefits of pacing with recurrent bradycardia  The benefits and risks were reviewed including but not limited to death,  perforation, infection, lead dislodgement and device malfunction.  The patient understands agrees and is willing to proceed.

## 2014-03-28 NOTE — Care Management Note (Addendum)
  Page 1 of 1   03/28/2014     4:08:57 PM CARE MANAGEMENT NOTE 03/28/2014  Patient:  Craig [name], Craig [name]   Account Number:  [id number]  Date Initiated:  03/28/2014  Documentation initiated by:  Craig [name]  Subjective/Objective Assessment:   bradycardia     Action/Plan:   CM to follow for disposition needs   Anticipated DC Date:  03/29/2014   Anticipated DC Plan:  HOME/SELF CARE         Choice offered to / List presented to:             Status of service:  Completed, signed off Medicare Important Message given?   (If response is "NO", the following Medicare IM given date fields will be blank) Date Medicare IM given:   Medicare IM given by:   Date Additional Medicare IM given:   Additional Medicare IM given by:    Discharge Disposition:  HOME/SELF CARE  Per UR Regulation:    If discussed at Long Length of Stay Meetings, dates discussed:    Comments:  Craig Hands RN, BSN, MSHL, CCM  Nurse - Case Manager,  ([address] [place] [phone]  03/28/2014 Med Review:  No needs identified. Dispo: Plan: Home Self care

## 2014-03-29 ENCOUNTER — Ambulatory Visit (HOSPITAL_COMMUNITY): Payer: Medicare Other

## 2014-03-29 ENCOUNTER — Telehealth: Payer: Self-pay | Admitting: Internal Medicine

## 2014-03-29 ENCOUNTER — Encounter (HOSPITAL_COMMUNITY): Payer: Self-pay | Admitting: *Deleted

## 2014-03-29 DIAGNOSIS — I441 Atrioventricular block, second degree: Secondary | ICD-10-CM | POA: Diagnosis not present

## 2014-03-29 DIAGNOSIS — J90 Pleural effusion, not elsewhere classified: Secondary | ICD-10-CM | POA: Diagnosis not present

## 2014-03-29 DIAGNOSIS — J9819 Other pulmonary collapse: Secondary | ICD-10-CM | POA: Diagnosis not present

## 2014-03-29 DIAGNOSIS — I4892 Unspecified atrial flutter: Secondary | ICD-10-CM | POA: Diagnosis not present

## 2014-03-29 DIAGNOSIS — I498 Other specified cardiac arrhythmias: Secondary | ICD-10-CM

## 2014-03-29 DIAGNOSIS — I4891 Unspecified atrial fibrillation: Secondary | ICD-10-CM | POA: Diagnosis not present

## 2014-03-29 NOTE — Discharge Instructions (Signed)
° ° °  Supplemental Discharge Instructions for  Pacemaker Patients  Activity No heavy lifting or vigorous activity with your left/right arm for 6 to 8 weeks.  Do not raise your left/right arm above your head for one week.  Gradually raise your affected arm as drawn below.            03/31/14                        04/01/14                        04/02/14                 04/03/14  NO DRIVING unless cleared by your doctor  WOUND CARE   Keep the wound area clean and dry.  Do not get this area wet for one week. No showers for one week; you may shower on  04/05/14   .   The tape/steri-strips on your wound will fall off; do not pull them off.  No bandage is needed on the site.  DO  NOT apply any creams, oils, or ointments to the wound area.   If you notice any drainage or discharge from the wound, any swelling or bruising at the site, or you develop a fever > 101? F after you are discharged home, call the office at once.  Special Instructions   You are still able to use cellular telephones; use the ear opposite the side where you have your pacemaker/defibrillator.  Avoid carrying your cellular phone near your device.   When traveling through airports, show security personnel your identification card to avoid being screened in the metal detectors.  Ask the security personnel to use the hand wand.   Avoid arc welding equipment, MRI testing (magnetic resonance imaging), TENS units (transcutaneous nerve stimulators).  Call the office for questions about other devices.   Avoid electrical appliances that are in poor condition or are not properly grounded.   Microwave ovens are safe to be near or to operate.

## 2014-03-29 NOTE — Telephone Encounter (Deleted)
Error

## 2014-04-07 ENCOUNTER — Encounter: Payer: Self-pay | Admitting: Internal Medicine

## 2014-04-07 ENCOUNTER — Ambulatory Visit (INDEPENDENT_AMBULATORY_CARE_PROVIDER_SITE_OTHER): Payer: Medicare Other | Admitting: *Deleted

## 2014-04-07 DIAGNOSIS — I4891 Unspecified atrial fibrillation: Secondary | ICD-10-CM

## 2014-04-07 DIAGNOSIS — I482 Chronic atrial fibrillation, unspecified: Secondary | ICD-10-CM

## 2014-04-07 LAB — MDC_IDC_ENUM_SESS_TYPE_INCLINIC
Battery Remaining Longevity: 141.6 mo
Battery Voltage: 3.08 V
Date Time Interrogation Session: 20150903115813
Lead Channel Pacing Threshold Amplitude: 0.375 V
Lead Channel Pacing Threshold Pulse Width: 0.4 ms
Lead Channel Sensing Intrinsic Amplitude: 8.3 mV
Lead Channel Setting Sensing Sensitivity: 2 mV
MDC IDC MSMT LEADCHNL RV IMPEDANCE VALUE: 650 Ohm
MDC IDC PG SERIAL: 7616716
MDC IDC SET LEADCHNL RV PACING AMPLITUDE: 0.625
MDC IDC SET LEADCHNL RV PACING PULSEWIDTH: 0.4 ms
MDC IDC STAT BRADY RV PERCENT PACED: 80 %

## 2014-04-07 NOTE — Progress Notes (Signed)
Wound check appointment.  Wound without redness or edema. Incision edges approximated, wound well healed. Normal device function. Thresholds, sensing, and impedances consistent with implant measurements. Device programmed at 3.5V/auto capture programmed on for extra safety margin until 3 month visit. Histogram distribution appropriate for patient and level of activity. No high ventricular rates noted.  A-fib, + eliquis.   Patient educated about wound care, arm mobility, lifting restrictions. ROV in 3 months with implanting physician.

## 2014-04-09 ENCOUNTER — Inpatient Hospital Stay (HOSPITAL_COMMUNITY)
Admission: EM | Admit: 2014-04-09 | Discharge: 2014-04-12 | DRG: 193 | Disposition: A | Discharge status: Home / Self Care | Payer: Medicare Other | Attending: Internal Medicine | Admitting: Internal Medicine

## 2014-04-09 ENCOUNTER — Emergency Department (HOSPITAL_COMMUNITY): Payer: Medicare Other

## 2014-04-09 ENCOUNTER — Encounter (HOSPITAL_COMMUNITY): Payer: Self-pay | Admitting: Emergency Medicine

## 2014-04-09 DIAGNOSIS — I129 Hypertensive chronic kidney disease with stage 1 through stage 4 chronic kidney disease, or unspecified chronic kidney disease: Secondary | ICD-10-CM | POA: Diagnosis present

## 2014-04-09 DIAGNOSIS — I10 Essential (primary) hypertension: Secondary | ICD-10-CM | POA: Diagnosis not present

## 2014-04-09 DIAGNOSIS — R05 Cough: Secondary | ICD-10-CM | POA: Diagnosis not present

## 2014-04-09 DIAGNOSIS — J189 Pneumonia, unspecified organism: Secondary | ICD-10-CM | POA: Diagnosis present

## 2014-04-09 DIAGNOSIS — F4310 Post-traumatic stress disorder, unspecified: Secondary | ICD-10-CM | POA: Diagnosis present

## 2014-04-09 DIAGNOSIS — N183 Chronic kidney disease, stage 3 unspecified: Secondary | ICD-10-CM | POA: Diagnosis present

## 2014-04-09 DIAGNOSIS — I059 Rheumatic mitral valve disease, unspecified: Secondary | ICD-10-CM | POA: Diagnosis present

## 2014-04-09 DIAGNOSIS — I5043 Acute on chronic combined systolic (congestive) and diastolic (congestive) heart failure: Secondary | ICD-10-CM

## 2014-04-09 DIAGNOSIS — Z950 Presence of cardiac pacemaker: Secondary | ICD-10-CM | POA: Diagnosis not present

## 2014-04-09 DIAGNOSIS — I7300 Raynaud's syndrome without gangrene: Secondary | ICD-10-CM | POA: Diagnosis present

## 2014-04-09 DIAGNOSIS — I2510 Atherosclerotic heart disease of native coronary artery without angina pectoris: Secondary | ICD-10-CM | POA: Diagnosis present

## 2014-04-09 DIAGNOSIS — Z87891 Personal history of nicotine dependence: Secondary | ICD-10-CM | POA: Diagnosis not present

## 2014-04-09 DIAGNOSIS — R918 Other nonspecific abnormal finding of lung field: Secondary | ICD-10-CM | POA: Diagnosis not present

## 2014-04-09 DIAGNOSIS — I509 Heart failure, unspecified: Secondary | ICD-10-CM | POA: Diagnosis not present

## 2014-04-09 DIAGNOSIS — I441 Atrioventricular block, second degree: Secondary | ICD-10-CM

## 2014-04-09 DIAGNOSIS — R059 Cough, unspecified: Secondary | ICD-10-CM | POA: Diagnosis not present

## 2014-04-09 DIAGNOSIS — R0602 Shortness of breath: Secondary | ICD-10-CM

## 2014-04-09 DIAGNOSIS — I5022 Chronic systolic (congestive) heart failure: Secondary | ICD-10-CM | POA: Diagnosis present

## 2014-04-09 DIAGNOSIS — Z79899 Other long term (current) drug therapy: Secondary | ICD-10-CM

## 2014-04-09 DIAGNOSIS — H9190 Unspecified hearing loss, unspecified ear: Secondary | ICD-10-CM | POA: Diagnosis present

## 2014-04-09 DIAGNOSIS — Z7901 Long term (current) use of anticoagulants: Secondary | ICD-10-CM

## 2014-04-09 DIAGNOSIS — J9691 Respiratory failure, unspecified with hypoxia: Secondary | ICD-10-CM | POA: Diagnosis present

## 2014-04-09 DIAGNOSIS — E785 Hyperlipidemia, unspecified: Secondary | ICD-10-CM | POA: Diagnosis present

## 2014-04-09 DIAGNOSIS — I4891 Unspecified atrial fibrillation: Secondary | ICD-10-CM | POA: Diagnosis present

## 2014-04-09 DIAGNOSIS — D72829 Elevated white blood cell count, unspecified: Secondary | ICD-10-CM | POA: Diagnosis present

## 2014-04-09 DIAGNOSIS — I482 Chronic atrial fibrillation, unspecified: Secondary | ICD-10-CM

## 2014-04-09 DIAGNOSIS — M109 Gout, unspecified: Secondary | ICD-10-CM | POA: Diagnosis present

## 2014-04-09 DIAGNOSIS — J9600 Acute respiratory failure, unspecified whether with hypoxia or hypercapnia: Secondary | ICD-10-CM | POA: Diagnosis present

## 2014-04-09 DIAGNOSIS — I480 Paroxysmal atrial fibrillation: Secondary | ICD-10-CM

## 2014-04-09 LAB — I-STAT TROPONIN, ED: TROPONIN I, POC: 0.15 ng/mL — AB (ref 0.00–0.08)

## 2014-04-09 LAB — BASIC METABOLIC PANEL
Anion gap: 16 — ABNORMAL HIGH (ref 5–15)
BUN: 22 mg/dL (ref 6–23)
CHLORIDE: 98 meq/L (ref 96–112)
CO2: 21 meq/L (ref 19–32)
Calcium: 9.1 mg/dL (ref 8.4–10.5)
Creatinine, Ser: 1.42 mg/dL — ABNORMAL HIGH (ref 0.50–1.35)
GFR calc Af Amer: 48 mL/min — ABNORMAL LOW (ref >90)
GFR calc non Af Amer: 41 mL/min — ABNORMAL LOW (ref >90)
Glucose, Bld: 125 mg/dL — ABNORMAL HIGH (ref 70–99)
POTASSIUM: 4.2 meq/L (ref 3.7–5.3)
Sodium: 135 mEq/L — ABNORMAL LOW (ref 137–147)

## 2014-04-09 LAB — CBC
HCT: 40.6 % (ref 39.0–52.0)
HEMOGLOBIN: 14.3 g/dL (ref 13.0–17.0)
MCH: 29.8 pg (ref 26.0–34.0)
MCHC: 35.2 g/dL (ref 30.0–36.0)
MCV: 84.6 fL (ref 78.0–100.0)
PLATELETS: 152 10*3/uL (ref 150–400)
RBC: 4.8 MIL/uL (ref 4.22–5.81)
RDW: 15.2 % (ref 11.5–15.5)
WBC: 13.1 10*3/uL — AB (ref 4.0–10.5)

## 2014-04-09 LAB — INFLUENZA PANEL BY PCR (TYPE A & B)
H1N1 flu by pcr: NOT DETECTED
INFLAPCR: NEGATIVE
Influenza B By PCR: NEGATIVE

## 2014-04-09 LAB — PRO B NATRIURETIC PEPTIDE: PRO B NATRI PEPTIDE: 2300 pg/mL — AB (ref 0–450)

## 2014-04-09 MED ORDER — ALLOPURINOL 300 MG PO TABS
450.0000 mg | ORAL_TABLET | Freq: Every day | ORAL | Status: DC
  Administered 2014-04-09 – 2014-04-12 (×4): 450 mg via ORAL
  Filled 2014-04-09 (×4): qty 1

## 2014-04-09 MED ORDER — PNEUMOCOCCAL VAC POLYVALENT 25 MCG/0.5ML IJ INJ
0.5000 mL | INJECTION | INTRAMUSCULAR | Status: AC
  Administered 2014-04-10: 0.5 mL via INTRAMUSCULAR
  Filled 2014-04-09: qty 0.5

## 2014-04-09 MED ORDER — LOSARTAN POTASSIUM 50 MG PO TABS
50.0000 mg | ORAL_TABLET | Freq: Every day | ORAL | Status: DC
  Administered 2014-04-09 – 2014-04-12 (×4): 50 mg via ORAL
  Filled 2014-04-09 (×4): qty 1

## 2014-04-09 MED ORDER — NITROGLYCERIN 0.4 MG SL SUBL: 0.4000 mg | SUBLINGUAL_TABLET | SUBLINGUAL | Status: DC | PRN

## 2014-04-09 MED ORDER — ACETAMINOPHEN 650 MG RE SUPP: 650.0000 mg | Freq: Four times a day (QID) | RECTAL | Status: DC | PRN

## 2014-04-09 MED ORDER — FUROSEMIDE 40 MG PO TABS
40.0000 mg | ORAL_TABLET | Freq: Every day | ORAL | Status: DC
  Administered 2014-04-09 – 2014-04-12 (×4): 40 mg via ORAL
  Filled 2014-04-09 (×6): qty 1

## 2014-04-09 MED ORDER — AMLODIPINE BESYLATE 5 MG PO TABS
5.0000 mg | ORAL_TABLET | Freq: Every day | ORAL | Status: DC
  Administered 2014-04-09 – 2014-04-12 (×3): 5 mg via ORAL
  Filled 2014-04-09 (×4): qty 1

## 2014-04-09 MED ORDER — ATORVASTATIN CALCIUM 10 MG PO TABS
10.0000 mg | ORAL_TABLET | Freq: Every day | ORAL | Status: DC
  Administered 2014-04-09 – 2014-04-11 (×3): 10 mg via ORAL
  Filled 2014-04-09 (×4): qty 1

## 2014-04-09 MED ORDER — POLYETHYLENE GLYCOL 3350 17 G PO PACK
17.0000 g | PACK | Freq: Every day | ORAL | Status: DC | PRN
  Filled 2014-04-09: qty 1

## 2014-04-09 MED ORDER — FINASTERIDE 5 MG PO TABS
5.0000 mg | ORAL_TABLET | Freq: Every day | ORAL | Status: DC
  Administered 2014-04-09 – 2014-04-12 (×4): 5 mg via ORAL
  Filled 2014-04-09 (×4): qty 1

## 2014-04-09 MED ORDER — SODIUM CHLORIDE 0.9 % IJ SOLN: 3.0000 mL | INTRAMUSCULAR | Status: DC | PRN

## 2014-04-09 MED ORDER — ASPIRIN 81 MG PO CHEW
324.0000 mg | CHEWABLE_TABLET | Freq: Once | ORAL | Status: AC
  Administered 2014-04-09: 324 mg via ORAL
  Filled 2014-04-09: qty 4

## 2014-04-09 MED ORDER — APIXABAN 2.5 MG PO TABS
2.5000 mg | ORAL_TABLET | Freq: Two times a day (BID) | ORAL | Status: DC
  Administered 2014-04-09 – 2014-04-12 (×6): 2.5 mg via ORAL
  Filled 2014-04-09 (×7): qty 1

## 2014-04-09 MED ORDER — ACETAMINOPHEN 325 MG PO TABS
650.0000 mg | ORAL_TABLET | Freq: Four times a day (QID) | ORAL | Status: DC | PRN
Start: 2014-04-09 — End: 2014-04-12

## 2014-04-09 MED ORDER — IPRATROPIUM-ALBUTEROL 0.5-2.5 (3) MG/3ML IN SOLN
3.0000 mL | Freq: Once | RESPIRATORY_TRACT | Status: AC
  Administered 2014-04-09: 3 mL via RESPIRATORY_TRACT
  Filled 2014-04-09: qty 3

## 2014-04-09 MED ORDER — VANCOMYCIN HCL 10 G IV SOLR
1250.0000 mg | Freq: Once | INTRAVENOUS | Status: AC
  Administered 2014-04-09: 1250 mg via INTRAVENOUS
  Filled 2014-04-09: qty 1250

## 2014-04-09 MED ORDER — VANCOMYCIN HCL 10 G IV SOLR
1250.0000 mg | INTRAVENOUS | Status: DC
  Administered 2014-04-10: 1250 mg via INTRAVENOUS
  Filled 2014-04-09: qty 1250

## 2014-04-09 MED ORDER — SODIUM CHLORIDE 0.9 % IJ SOLN
3.0000 mL | Freq: Two times a day (BID) | INTRAMUSCULAR | Status: DC
Start: 2014-04-09 — End: 2014-04-12
  Administered 2014-04-09 – 2014-04-12 (×6): 3 mL via INTRAVENOUS

## 2014-04-09 MED ORDER — CEFEPIME HCL 1 G IJ SOLR
1.0000 g | INTRAMUSCULAR | Status: DC
  Administered 2014-04-10: 1 g via INTRAVENOUS
  Filled 2014-04-09: qty 1

## 2014-04-09 MED ORDER — GUAIFENESIN-DM 100-10 MG/5ML PO SYRP
5.0000 mL | ORAL_SOLUTION | ORAL | Status: DC | PRN
  Administered 2014-04-12: 5 mL via ORAL
  Filled 2014-04-09 (×2): qty 5

## 2014-04-09 MED ORDER — SODIUM CHLORIDE 0.9 % IV SOLN: 250.0000 mL | INTRAVENOUS | Status: DC | PRN

## 2014-04-09 MED ORDER — ONDANSETRON HCL 4 MG PO TABS: 4.0000 mg | ORAL_TABLET | Freq: Four times a day (QID) | ORAL | Status: DC | PRN

## 2014-04-09 MED ORDER — ISOSORBIDE MONONITRATE ER 60 MG PO TB24
60.0000 mg | ORAL_TABLET | Freq: Every day | ORAL | Status: DC
  Administered 2014-04-09 – 2014-04-12 (×4): 60 mg via ORAL
  Filled 2014-04-09 (×4): qty 1

## 2014-04-09 MED ORDER — DEXTROSE 5 % IV SOLN
1.0000 g | Freq: Once | INTRAVENOUS | Status: AC
  Administered 2014-04-09: 1 g via INTRAVENOUS
  Filled 2014-04-09: qty 1

## 2014-04-09 MED ORDER — ONDANSETRON HCL 4 MG/2ML IJ SOLN
4.0000 mg | Freq: Four times a day (QID) | INTRAMUSCULAR | Status: DC | PRN
Start: 2014-04-09 — End: 2014-04-12

## 2014-04-09 MED ORDER — LEVOTHYROXINE SODIUM 50 MCG PO TABS
50.0000 ug | ORAL_TABLET | Freq: Every day | ORAL | Status: DC
  Administered 2014-04-10 – 2014-04-12 (×3): 50 ug via ORAL
  Filled 2014-04-09 (×4): qty 1

## 2014-04-09 NOTE — Progress Notes (Signed)
ANTIBIOTIC CONSULT NOTE - INITIAL  Pharmacy Consult for vancomycin  Indication: pneumonia  Allergies  Allergen Reactions  . Zocor [Simvastatin] Other (See Comments)    Unknown reaction per pt    Patient Measurements:    Vital Signs: Temp: 98.4 F (36.9 C) (09/05 0925) Temp src: Oral (09/05 0925) BP: 141/76 mmHg (09/05 0925) Pulse Rate: 79 (09/05 0925) Intake/Output from previous day:   Intake/Output from this shift:    Labs:  Recent Labs  04/09/14 0936  WBC 13.1*  HGB 14.3  PLT 152   The CrCl is unknown because both a height and weight (above a minimum accepted value) are required for this calculation. No results found for this basename: VANCOTROUGH, Leodis Binet, VANCORANDOM, GENTTROUGH, GENTPEAK, GENTRANDOM, TOBRATROUGH, TOBRAPEAK, TOBRARND, AMIKACINPEAK, AMIKACINTROU, AMIKACIN,  in the last 72 hours   Microbiology: Recent Results (from the past 720 hour(s))  SURGICAL PCR SCREEN     Status: None   Collection Time    03/28/14  8:24 AM      Result Value Ref Range Status   MRSA, PCR NEGATIVE  NEGATIVE Final   Staphylococcus aureus NEGATIVE  NEGATIVE Final   Comment:            The Xpert SA Assay (FDA     approved for NASAL specimens     in patients over 70 years of age),     is one component of     a comprehensive surveillance     program.  Test performance has     been validated by [employer] for patients greater     than or equal to 54 year old.     It is not intended     to diagnose infection nor to     guide or monitor treatment.    Medical History: Past Medical History  Diagnosis Date  . Mitral insufficiency     chronically moderate to severe  . Hypertension   . Hyperlipidemia   . 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 Coronary artery disease     a. Remote lateral infarct;  b. 09/2011 MV: large dense inferolat/lat scar - ? very slight peri-infarcrt isch.  No signif change c/w 09/2010 scan.  . Raynaud's disease /phenomenon   . Hard of  hearing     bilaterally  . Gout   . PTSD (post-traumatic stress disorder)     "being treated for it now but not a big degree"  . Asthma   . Noncompliance with medication regimen   . Chronic systolic CHF (congestive heart failure)     a. 10/2011 Echo: EF 40-45%, inf AK, PASP .  Marland Kitchen Persistent atrial fibrillation     Medications:  See EMR  Assessment: 80 year old male presents to [facility] with cough and fatigue. Patient to start on vancomycin and cefepime in the ED for possible pna/HCAP.  No fevers noted. Scr slightly elevated at 1.4.  Goal of Therapy:  Vancomycin trough level 15-20 mcg/ml  Plan:  Measure antibiotic drug levels at steady state Follow up culture results Vancomycin  x1 q24 hours  Sheppard Coil PharmD., BCPS Clinical Pharmacist Pager [phone] 04/09/2014 10:34 AM

## 2014-04-09 NOTE — Progress Notes (Addendum)
Received report from [facility] in ED @ 1350

## 2014-04-09 NOTE — H&P (Signed)
[facility] History and Physical  Craig [name]:[id number] DOB: 01-Feb-1919 DOA: 04/09/2014  Referring physician: Dr. Juanda Chance PCP: [name], MD   Chief Complaint: cough and SOb  HPI: Craig [name] is a 80 y.o. male  Past medical history of combined systolic and diastolic heart failure with an EF of 45%, a fib with symptomatic bradycardia status post pacemaker implantation on 03/29/2014, coronary artery disease with  lateral infarct 4.2 2013 presents on the day of admission complaining of productive cough and increased fatigue. He relates that after coughing for one to 2 days he started developing upper abdominal pain. The cough is productive of yellow sputum. With shortness of breath that has progressively gotten worse. He relates his abdominal pain is diffuse and only when he coughs. He denies any fever, nausea, vomiting, diarrhea or sick contacts.   In the ED: Chest x-ray was done that shows results are below with a CBC with an elevated white blood cell counts we were consulted for further evaluation.  Review of Systems:  Constitutional:  No weight loss, night sweats, Fevers, chills, fatigue.  HEENT:  No headaches, Difficulty swallowing,Tooth/dental problems,Sore throat,  No sneezing, itching, ear ache, nasal congestion, post nasal drip,  Cardio-vascular:  No chest pain, Orthopnea, PND, swelling in lower extremities, anasarca, dizziness, palpitations  GI:  No heartburn, indigestion, abdominal pain, nausea, vomiting, diarrhea, change in bowel habits, loss of appetite  Resp:  No shortness of breath with exertion or at rest. No excess mucus, no productive cough, No non-productive cough, No coughing up of blood.No change in color of mucus.No wheezing.No chest wall deformity  Skin:  no rash or lesions.  GU:  no dysuria, change in color of urine, no urgency or frequency. No flank pain.  Musculoskeletal:  No joint pain or swelling. No decreased range of motion.  No back pain.  Psych:  No change in mood or affect. No depression or anxiety. No memory loss.   Past Medical History  Diagnosis Date  . Mitral insufficiency     chronically moderate to severe  . Hypertension   . Hyperlipidemia   . 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 Coronary artery disease     a. Remote lateral infarct;  b. 09/2011 MV: large dense inferolat/lat scar - ? very slight peri-infarcrt isch.  No signif change c/w 09/2010 scan.  . Raynaud's disease /phenomenon   . Hard of hearing     bilaterally  . Gout   . PTSD (post-traumatic stress disorder)     "being treated for it now but not a big degree"  . Asthma   . Noncompliance with medication regimen   . Chronic systolic CHF (congestive heart failure)     a. 10/2011 Echo: EF 40-45%, inf AK, PASP .  Marland Kitchen Persistent atrial fibrillation    Past Surgical History  Procedure Laterality Date  . Prostatectomy    . Tonsillectomy    . Inguinal hernia repair      left  . Appendectomy    . Cardiac catheterization  2003    SHOWED OCCLUSION OF THE LEFT CIRCUMFLEX CORONARY ARTERY  . Pacemaker insertion  03/28/14    STJ single chamber pacemaker implanted by Dr Graciela Husbands for symptomatic bradycardia   Social History:  reports that he quit smoking about 43 years ago. He has never used smokeless tobacco. He reports that he does not drink alcohol or use illicit drugs.  Allergies  Allergen Reactions  . Zocor [Simvastatin] Other (  See Comments)    Unknown reaction per pt    Family History  Problem Relation Age of Onset  . Heart attack Brother   . Alcohol abuse Father     died in his late 26's  . Other Mother     died in her late 47's - old age.     Prior to Admission medications   Medication Sig Start Date End Date Taking? Authorizing Provider  allopurinol (ZYLOPRIM) 300 MG tablet Take 450 mg by mouth daily.    Yes Historical Provider, MD  amLODipine (NORVASC) 5 MG tablet Take 5 mg by mouth daily.   Yes Historical Provider, MD   apixaban (ELIQUIS) 2.5 MG TABS tablet Take 2.5 mg by mouth 2 (two) times daily.   Yes Historical Provider, MD  atorvastatin (LIPITOR) 10 MG tablet Take 10 mg by mouth daily at 6 PM.   Yes Historical Provider, MD  Cholecalciferol (VITAMIN D-3) 1000 UNITS CAPS Take 1,000 Units by mouth 2 (two) times daily.    Yes Historical Provider, MD  Coenzyme Q10 (COQ10 PO) Take 1 capsule by mouth daily.    Yes Historical Provider, MD  finasteride (PROSCAR) 5 MG tablet Take 5 mg by mouth daily.    Yes Historical Provider, MD  furosemide (LASIX) 20 MG tablet Take 40 mg by mouth daily.   Yes Historical Provider, MD  isosorbide mononitrate (IMDUR) 60 MG 24 hr tablet Take 60 mg by mouth daily.   Yes Historical Provider, MD  levothyroxine (SYNTHROID, LEVOTHROID) 50 MCG tablet Take 50 mcg by mouth daily before breakfast.   Yes Historical Provider, MD  losartan (COZAAR) 50 MG tablet Take 50 mg by mouth daily.   Yes Historical Provider, MD  nitroGLYCERIN (NITROSTAT) 0.4 MG SL tablet Place 1 tablet (0.4 mg total) under the tongue every 5 (five) minutes as needed. x3 doses as needed for chest pain 10/18/12  Yes Ok Anis, NP   Physical Exam: Filed Vitals:   04/09/14 0925 04/09/14 1100  BP: 141/76   Pulse: 79   Temp: 98.4 F (36.9 C)   TempSrc: Oral   Resp: 32   Height:  5' 10.08" (1.78 m)  Weight:  84 kg (185 lb 3 oz)  SpO2: 93%     Wt Readings from Last 3 Encounters:  04/09/14 84 kg (185 lb 3 oz)  03/29/14 84.3 kg (185 lb 13.6 oz)  03/29/14 84.3 kg (185 lb 13.6 oz)    General:  Appears calm and comfortable Eyes: PERRL, normal lids, irises & conjunctiva ENT: grossly normal hearing, lips & tongue Neck: no LAD, masses or thyromegaly Cardiovascular: RRR, no m/r/g. No LE edema. Telemetry: SR, no arrhythmias  Respiratory:  good air movement with crackles on the left  Abdomen: soft, ntnd Skin: no rash or induration seen on limited exam Musculoskeletal: grossly normal tone BUE/BLE Psychiatric:  grossly normal mood and affect, speech fluent and appropriate Neurologic: grossly non-focal.          Labs on Admission:  Basic Metabolic Panel:  Recent Labs Lab 04/09/14 0936  NA 135*  K 4.2  CL 98  CO2 21  GLUCOSE 125*  BUN 22  CREATININE 1.42*  CALCIUM 9.1   Liver Function Tests: No results found for this basename: AST, ALT, ALKPHOS, BILITOT, PROT, ALBUMIN,  in the last 168 hours No results found for this basename: LIPASE, AMYLASE,  in the last 168 hours No results found for this basename: AMMONIA,  in the last 168 hours CBC:  Recent  Labs Lab 04/09/14 0936  WBC 13.1*  HGB 14.3  HCT 40.6  MCV 84.6  PLT 152   Cardiac Enzymes: No results found for this basename: CKTOTAL, CKMB, CKMBINDEX, TROPONINI,  in the last 168 hours  BNP (last 3 results)  Recent Labs  04/09/14 0936  PROBNP 2300.0*   CBG: No results found for this basename: GLUCAP,  in the last 168 hours  Radiological Exams on Admission: Dg Chest 2 View (if Patient Has Fever And/or Copd)  04/09/2014   CLINICAL DATA:  Cough  EXAM: CHEST  2 VIEW  COMPARISON:  03/29/2014  FINDINGS: Mild left basilar opacity, likely atelectasis. No focal consolidation. No pleural effusion or pneumothorax.  The heart is normal in size.  Left subclavian pacemaker.  Mild degenerative changes of the visualized thoracolumbar spine.  IMPRESSION: Mild left basilar opacity, likely atelectasis.   Electronically Signed   By: Charline Bills M.D.   On: 04/09/2014 11:09    EKG: Independently reviewed. Paced rhythm prolonged PR left axis deviation  Assessment/Plan Respiratory failure with hypoxia  HCAP (healthcare-associated pneumonia) - I agree with treating him for associated pneumonia vancomycin cefepime. Sputum cultures check a lesion in an HIV. - Patient is currently not wheezing use guaifenesin for cough - Continue oxygen.  Chronic renal failure stage III: - Currently at baseline continue current home meds.  Hypertension: -  BP currently stable continue current home meds  Chronic systolic CHF (congestive heart failure) - Euvolemic continue Lasix, valsartan, imdur.  Atrial fibrillation - Rate controlled will continue metoprolol and Eluquis.    Code Status: full DVT Prophylaxis:heparin Family Communication: daughter Disposition Plan: inpatient  Time spent: 70 minutes  Marinda Elk [facility] Pager [phone]  **Disclaimer: This note may have been dictated with voice recognition software. Similar sounding words can inadvertently be transcribed and this note may contain transcription errors which may not have been corrected upon publication of note.**

## 2014-04-09 NOTE — ED Provider Notes (Signed)
CSN: [id number]     Arrival date & time 04/09/14  0857 History   First MD Initiated Contact with Patient 04/09/14 1011     Chief Complaint  Patient presents with  . URI  . Abdominal Pain     (Consider location/radiation/quality/duration/timing/severity/associated sxs/prior Treatment) HPI The patient presents with a chief complaint of cough and increasing fatigue with some generalized upper abdominal pain. The patient identifies his symptoms as having been for 3 days as a "bad cough ". Cough is productive of a yellow-green sputum as identified in the emergency department visit. The patient has upper abdominal discomfort diffusely. He reports this is made worse by coughing. He thinks it has calmed from all the harsh coughing is done over the past 3 days. No vomiting the patient denies being significantly short of breath. The daughter however notes she has observed him to be short of breath. No fever has been documented. The patient denies chest pain. The patient is status post pacemaker placement 12 days ago. The patient was seen in followup at his physician 3 days ago for wound care check. That visit when well. The daughter does however report that at that time he was developing his cough.  Past Medical History  Diagnosis Date  . Mitral insufficiency     chronically moderate to severe  . Hypertension   . Hyperlipidemia   . 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 Coronary artery disease     a. Remote lateral infarct;  b. 09/2011 MV: large dense inferolat/lat scar - ? very slight peri-infarcrt isch.  No signif change c/w 09/2010 scan.  . Raynaud's disease /phenomenon   . Hard of hearing     bilaterally  . Gout   . PTSD (post-traumatic stress disorder)     "being treated for it now but not a big degree"  . Asthma   . Noncompliance with medication regimen   . Chronic systolic CHF (congestive heart failure)     a. 10/2011 Echo: EF 40-45%, inf AK, PASP .  Marland Kitchen Persistent atrial  fibrillation    Past Surgical History  Procedure Laterality Date  . Prostatectomy    . Tonsillectomy    . Inguinal hernia repair      left  . Appendectomy    . Cardiac catheterization  2003    SHOWED OCCLUSION OF THE LEFT CIRCUMFLEX CORONARY ARTERY  . Pacemaker insertion  03/28/14    STJ single chamber pacemaker implanted by Dr Graciela Husbands for symptomatic bradycardia   Family History  Problem Relation Age of Onset  . Heart attack Brother   . Alcohol abuse Father     died in his late 21's  . Other Mother     died in her late 10's - old age.   History  Substance Use Topics  . Smoking status: Former Smoker -- 1.50 packs/day for 20 years    Quit date: 12/12/1970  . Smokeless tobacco: Never Used  . Alcohol Use: No    Review of Systems 10 systems reviewed and negative except as per history of present illness.   Allergies  Zocor  Home Medications   Prior to Admission medications   Medication Sig Start Date End Date Taking? Authorizing Provider  allopurinol (ZYLOPRIM) 300 MG tablet Take 450 mg by mouth daily.    Yes Historical Provider, MD  amLODipine (NORVASC) 5 MG tablet Take 5 mg by mouth daily.   Yes Historical Provider, MD  apixaban (ELIQUIS) 2.5 MG TABS  tablet Take 2.5 mg by mouth 2 (two) times daily.   Yes Historical Provider, MD  atorvastatin (LIPITOR) 10 MG tablet Take 10 mg by mouth daily at 6 PM.   Yes Historical Provider, MD  Cholecalciferol (VITAMIN D-3) 1000 UNITS CAPS Take 1,000 Units by mouth 2 (two) times daily.    Yes Historical Provider, MD  Coenzyme Q10 (COQ10 PO) Take 1 capsule by mouth daily.    Yes Historical Provider, MD  finasteride (PROSCAR) 5 MG tablet Take 5 mg by mouth daily.    Yes Historical Provider, MD  furosemide (LASIX) 20 MG tablet Take 40 mg by mouth daily.   Yes Historical Provider, MD  isosorbide mononitrate (IMDUR) 60 MG 24 hr tablet Take 60 mg by mouth daily.   Yes Historical Provider, MD  levothyroxine (SYNTHROID, LEVOTHROID) 50 MCG tablet  Take 50 mcg by mouth daily before breakfast.   Yes Historical Provider, MD  losartan (COZAAR) 50 MG tablet Take 50 mg by mouth daily.   Yes Historical Provider, MD  nitroGLYCERIN (NITROSTAT) 0.4 MG SL tablet Place 1 tablet (0.4 mg total) under the tongue every 5 (five) minutes as needed. x3 doses as needed for chest pain 10/18/12  Yes Ok Anis, NP   BP 141/76  Pulse 79  Temp(Src) 98.4 F (36.9 C) (Oral)  Resp 32  SpO2 93% Physical Exam  Constitutional: He is oriented to person, place, and time. He appears well-developed and well-nourished.  HENT:  Head: Normocephalic and atraumatic.  Mouth/Throat: Oropharynx is clear and moist.  Eyes: EOM are normal. Pupils are equal, round, and reactive to light.  Neck: Neck supple.  Cardiovascular: Normal rate, regular rhythm, normal heart sounds and intact distal pulses.   Pulmonary/Chest: Effort normal. He has wheezes.  Patient has bilateral expiratory wheeze. He also goes into the harsh cough paroxysmal is productive of a yellow sputum. He has signs of mild respiratory distress but speaking in full sentences and is alert and comfortable in his breathing pattern.  Abdominal: Soft. Bowel sounds are normal. He exhibits no distension. There is no tenderness.  Musculoskeletal: Normal range of motion. He exhibits no edema.  Neurological: He is alert and oriented to person, place, and time. He has normal strength. Coordination normal. GCS eye subscore is 4. GCS verbal subscore is 5. GCS motor subscore is 6.  Skin: Skin is warm, dry and intact.  Psychiatric: He has a normal mood and affect.    ED Course  Procedures (including critical care time) Labs Review Labs Reviewed  CBC - Abnormal; Notable for the following:    WBC 13.1 (*)    All other components within normal limits  I-STAT TROPOININ, ED - Abnormal; Notable for the following:    Troponin i, poc 0.15 (*)    All other components within normal limits  BASIC METABOLIC PANEL  PRO B  NATRIURETIC PEPTIDE    Imaging Review No results found.   EKG Interpretation None      MDM   Final diagnoses:  Healthcare-associated pneumonia   Patient presents with 3 days of cough productive of purulent sputum. Chest x-ray shows infiltrate or atelectasis. Pt is status post pacemaker placement. He'll be treated for healthcare associated pneumonia. Troponin shows mild elevation. At this time EKG is not showing an acute ischemic pattern. The patient is not experiencing chest pain. He will be admitted for continued monitoring.    Arby Barrette, MD 04/09/14 1213

## 2014-04-09 NOTE — Progress Notes (Signed)
NURSING PROGRESS NOTE  Craig [name] [id number] Admission Data: 04/09/2014 5:20 PM Attending Provider: Marinda Elk, MD WUJ:WJXBJ,YNWGNF ALEXANDER, MD Code Status: FULL  Craig [name] is a 80 y.o. male patient admitted from ED:  -No acute distress noted.  -No complaints of shortness of breath.  -No complaints of chest pain.   Cardiac Monitoring: Box # 18 in place. Cardiac monitor yields:atrial flutter.  Blood pressure 116/67, pulse 77, temperature 98.3 F (36.8 C), temperature source Oral, resp. rate 16, height 5' 10.08" (1.78 m), weight 84 kg (185 lb 3 oz), SpO2 95.00%.   IV Fluids:  IV in place, occlusive dsg intact without redness, IV cath wrist right, condition patent and no redness   Allergies:  Zocor  Past Medical History:   has a past medical history of Mitral insufficiency; Hypertension; Hyperlipidemia; AV block, 2nd degree; Coronary artery disease; Raynaud's disease /phenomenon; Hard of hearing; Gout; PTSD (post-traumatic stress disorder); Asthma; Noncompliance with medication regimen; Chronic systolic CHF (congestive heart failure); and Persistent atrial fibrillation.  Past Surgical History:   has past surgical history that includes Prostatectomy; Tonsillectomy; Inguinal hernia repair; Appendectomy; Cardiac catheterization (2003); and Pacemaker insertion (03/28/14).  Skin: intact  Patient/Family orientated to room. Information packet given to patient/family. Admission inpatient armband information verified with patient/family to include name and date of birth and placed on patient arm. Side rails up x 2, fall assessment and education completed with patient/family. Patient/family able to verbalize understanding of risk associated with falls and verbalized understanding to call for assistance before getting out of bed. Call light within reach. Patient/family able to voice and demonstrate understanding of unit orientation instructions.    Will continue to evaluate  and treat per MD orders.  Cathlyn Parsons, RN

## 2014-04-09 NOTE — ED Notes (Addendum)
Pt daughter brought him for cough and fatigue since yesterday. She states "he didn't get out of the bed or eat anything yesterday." he c/o abd pain when he moves and coughs. His breathing is labored at rest but he denies feeling SOB and he can speak in full sentences.

## 2014-04-10 DIAGNOSIS — J189 Pneumonia, unspecified organism: Secondary | ICD-10-CM | POA: Diagnosis not present

## 2014-04-10 LAB — COMPREHENSIVE METABOLIC PANEL
ALT: 21 U/L (ref 0–53)
ANION GAP: 14 (ref 5–15)
AST: 25 U/L (ref 0–37)
Albumin: 3.1 g/dL — ABNORMAL LOW (ref 3.5–5.2)
Alkaline Phosphatase: 73 U/L (ref 39–117)
BUN: 29 mg/dL — AB (ref 6–23)
CALCIUM: 8.6 mg/dL (ref 8.4–10.5)
CO2: 24 mEq/L (ref 19–32)
CREATININE: 1.67 mg/dL — AB (ref 0.50–1.35)
Chloride: 99 mEq/L (ref 96–112)
GFR calc non Af Amer: 34 mL/min — ABNORMAL LOW (ref >90)
GFR, EST AFRICAN AMERICAN: 39 mL/min — AB (ref >90)
Glucose, Bld: 122 mg/dL — ABNORMAL HIGH (ref 70–99)
Potassium: 4.5 mEq/L (ref 3.7–5.3)
Sodium: 137 mEq/L (ref 137–147)
TOTAL PROTEIN: 6.9 g/dL (ref 6.0–8.3)
Total Bilirubin: 0.6 mg/dL (ref 0.3–1.2)

## 2014-04-10 LAB — CBC
HCT: 38.7 % — ABNORMAL LOW (ref 39.0–52.0)
HEMOGLOBIN: 13.4 g/dL (ref 13.0–17.0)
MCH: 29.3 pg (ref 26.0–34.0)
MCHC: 34.6 g/dL (ref 30.0–36.0)
MCV: 84.5 fL (ref 78.0–100.0)
PLATELETS: 161 10*3/uL (ref 150–400)
RBC: 4.58 MIL/uL (ref 4.22–5.81)
RDW: 15 % (ref 11.5–15.5)
WBC: 9.7 10*3/uL (ref 4.0–10.5)

## 2014-04-10 LAB — HIV ANTIBODY (ROUTINE TESTING W REFLEX): HIV: NONREACTIVE

## 2014-04-10 MED ORDER — LEVOFLOXACIN 750 MG PO TABS
750.0000 mg | ORAL_TABLET | ORAL | Status: DC
  Administered 2014-04-10 – 2014-04-12 (×2): 750 mg via ORAL
  Filled 2014-04-10 (×2): qty 1

## 2014-04-10 NOTE — Progress Notes (Signed)
ANTIBIOTIC CONSULT NOTE - INITIAL  Pharmacy Consult for Levaquin Indication: pneumonia  Allergies  Allergen Reactions  . Zocor [Simvastatin] Other (See Comments)    Unknown reaction per pt    Patient Measurements: Height: 5' 10.08" (178 cm) Weight: 185 lb 3 oz (84 kg) IBW/kg (Calculated) : 73.18 Adjusted Body Weight:    Vital Signs: Temp: 99.2 F (37.3 C) (09/06 0510) Temp src: Oral (09/06 0510) BP: 112/63 mmHg (09/06 0935) Pulse Rate: 78 (09/06 0510) Intake/Output from previous day: 09/05 0701 - 09/06 0700 In: 223 [P.O.:220; I.V.:3] Out: 1100 [Urine:1100] Intake/Output from this shift:    Labs:  Recent Labs  04/09/14 0936 04/10/14 0500  WBC 13.1* 9.7  HGB 14.3 13.4  PLT 152 161  CREATININE 1.42* 1.67*   Estimated Creatinine Clearance: 28.6 ml/min (by C-G formula based on Cr of 1.67). No results found for this basename: VANCOTROUGH, Leodis Binet, VANCORANDOM, GENTTROUGH, GENTPEAK, GENTRANDOM, TOBRATROUGH, TOBRAPEAK, TOBRARND, AMIKACINPEAK, AMIKACINTROU, AMIKACIN,  in the last 72 hours   Microbiology: Recent Results (from the past 720 hour(s))  SURGICAL PCR SCREEN     Status: None   Collection Time    03/28/14  8:24 AM      Result Value Ref Range Status   MRSA, PCR NEGATIVE  NEGATIVE Final   Staphylococcus aureus NEGATIVE  NEGATIVE Final   Comment:            The Xpert SA Assay (FDA     approved for NASAL specimens     in patients over 65 years of age),     is one component of     a comprehensive surveillance     program.  Test performance has     been validated by [employer] for patients greater     than or equal to 93 year old.     It is not intended     to diagnose infection nor to     guide or monitor treatment.    Medical History: Past Medical History  Diagnosis Date  . Mitral insufficiency     chronically moderate to severe  . Hypertension   . Hyperlipidemia   . 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 Coronary artery disease     a.  Remote lateral infarct;  b. 09/2011 MV: large dense inferolat/lat scar - ? very slight peri-infarcrt isch.  No signif change c/w 09/2010 scan.  . Raynaud's disease /phenomenon   . Hard of hearing     bilaterally  . Gout   . PTSD (post-traumatic stress disorder)     "being treated for it now but not a big degree"  . Asthma   . Noncompliance with medication regimen   . Chronic systolic CHF (congestive heart failure)     a. 10/2011 Echo: EF 40-45%, inf AK, PASP .  Marland Kitchen Persistent atrial fibrillation      Assessment: cough x 2 days -->upper abd pain only when he coughs, SOB  Infectious Disease-  PNA/ HCAP, cough productive yellow sputum  99.2, WBC decr to 9.7, SCr 1.67 < 1.42,  CrCl ~ 28 ml/min  Vanc 9/5>>9/6 Cefepime 9/5>>9/6  9/5 Bld x2 Pending 9/5 Sputum pending   Plan:  Levaquin  po q48h.   Markeese Boyajian S. Merilynn Finland, PharmD, BCPS Clinical Staff Pharmacist Pager [phone]  Misty Stanley Stillinger 04/10/2014,11:51 AM

## 2014-04-10 NOTE — Progress Notes (Signed)
[facility] PROGRESS NOTE  Craig [name] ZOX:[id number] DOB: 26-Dec-1918 DOA: 04/09/2014 PCP: [name], MD  Assessment/Plan: Respiratory failure with hypoxia HCAP (healthcare-associated pneumonia)  - Started on vancomycin cefepime empirically - Pt stable, will transition to PO abx - Patient is currently not wheezing use guaifenesin for cough  - O2 as needed Chronic renal failure stage III:  - Currently at baseline continue current home meds.  Hypertension:  - BP currently stable continue current home meds  Chronic systolic CHF (congestive heart failure)  - Euvolemic continue Lasix, valsartan, imdur.  Atrial fibrillation  - Rate controlled will continue metoprolol and Eluquis.   Code Status: Full Family Communication: Pt in room (indicate person spoken with, relationship, and if by phone, the number) Disposition Plan: Home if stable on PO abx   Consultants:    Procedures:    Antibiotics:  vanc 9/5-9/6  Cefepime 9/5-9/6  Levaquin 9/6>>>   HPI/Subjective: Pt is without complaints. Feels better this AM  Objective: Filed Vitals:   04/09/14 1515 04/09/14 2201 04/10/14 0510 04/10/14 0935  BP: 116/67 121/74 112/60 112/63  Pulse: 77 74 78   Temp: 98.3 F (36.8 C) 98.6 F (37 C) 99.2 F (37.3 C)   TempSrc: Oral Oral Oral   Resp: Height:      Weight:      SpO2: 95% 95% 96%     Intake/Output Summary (Last 24 hours) at 04/10/14 1157 Last data filed at 04/10/14 0512  Gross per 24 hour  Intake    223 ml  Output   1100 ml  Net   -877 ml   Filed Weights   04/09/14 1100  Weight: 84 kg (185 lb 3 oz)    Exam:   General:  Awake, in nad  Cardiovascular: regular, s1, s2  Respiratory: normal resp effort, no wheezing  Abdomen: soft,nondistended  Musculoskeletal: perfused, no clubbing   Data Reviewed: Basic Metabolic Panel:  Recent Labs Lab 04/09/14 0936 04/10/14 0500  NA 135* 137  K 4.2 4.5  CL 98 99  CO2 21 24   GLUCOSE 125* 122*  BUN 22 29*  CREATININE 1.42* 1.67*  CALCIUM 9.1 8.6   Liver Function Tests:  Recent Labs Lab 04/10/14 0500  AST 25  ALT 21  ALKPHOS 73  BILITOT 0.6  PROT 6.9  ALBUMIN 3.1*   No results found for this basename: LIPASE, AMYLASE,  in the last 168 hours No results found for this basename: AMMONIA,  in the last 168 hours CBC:  Recent Labs Lab 04/09/14 0936 04/10/14 0500  WBC 13.1* 9.7  HGB 14.3 13.4  HCT 40.6 38.7*  MCV 84.6 84.5  PLT 152 161   Cardiac Enzymes: No results found for this basename: CKTOTAL, CKMB, CKMBINDEX, TROPONINI,  in the last 168 hours BNP (last 3 results)  Recent Labs  04/09/14 0936  PROBNP 2300.0*   CBG: No results found for this basename: GLUCAP,  in the last 168 hours  No results found for this or any previous visit (from the past 240 hour(s)).   Studies: Dg Chest 2 View (if Patient Has Fever And/or Copd)  04/09/2014   CLINICAL DATA:  Cough  EXAM: CHEST  2 VIEW  COMPARISON:  03/29/2014  FINDINGS: Mild left basilar opacity, likely atelectasis. No focal consolidation. No pleural effusion or pneumothorax.  The heart is normal in size.  Left subclavian pacemaker.  Mild degenerative changes of the visualized thoracolumbar spine.  IMPRESSION: Mild left basilar opacity, likely atelectasis.  Electronically Signed   By: Charline Bills M.D.   On: 04/09/2014 11:09    Scheduled Meds: . allopurinol  450 mg Oral Daily  . amLODipine  5 mg Oral Daily  . apixaban  2.5 mg Oral BID  . atorvastatin  10 mg Oral q1800  . finasteride  5 mg Oral Daily  . furosemide  40 mg Oral Q breakfast  . isosorbide mononitrate  60 mg Oral Daily  . levofloxacin  750 mg Oral Q48H  . levothyroxine  50 mcg Oral QAC breakfast  . losartan  50 mg Oral Daily  . sodium chloride  3 mL Intravenous Q12H   Continuous Infusions:   Principal Problem:   HCAP (healthcare-associated pneumonia) Active Problems:   Hypertension   Chronic systolic CHF (congestive  heart failure)   Atrial fibrillation   Respiratory failure with hypoxia  Time spent:  Padraig Nhan K  [facility] Pager [phone]. If 7PM-7AM, please contact night-coverage at [email or web address], password [facility] 04/10/2014, 11:57 AM  LOS: 1 day

## 2014-04-11 DIAGNOSIS — I441 Atrioventricular block, second degree: Secondary | ICD-10-CM

## 2014-04-11 DIAGNOSIS — I10 Essential (primary) hypertension: Secondary | ICD-10-CM

## 2014-04-11 DIAGNOSIS — R0602 Shortness of breath: Secondary | ICD-10-CM

## 2014-04-11 LAB — LEGIONELLA ANTIGEN, URINE: Legionella Antigen, Urine: NEGATIVE

## 2014-04-11 LAB — CBC
HEMATOCRIT: 36.4 % — AB (ref 39.0–52.0)
Hemoglobin: 12.6 g/dL — ABNORMAL LOW (ref 13.0–17.0)
MCH: 29.1 pg (ref 26.0–34.0)
MCHC: 34.6 g/dL (ref 30.0–36.0)
MCV: 84.1 fL (ref 78.0–100.0)
PLATELETS: 171 10*3/uL (ref 150–400)
RBC: 4.33 MIL/uL (ref 4.22–5.81)
RDW: 14.9 % (ref 11.5–15.5)
WBC: 9.8 10*3/uL (ref 4.0–10.5)

## 2014-04-11 MED ORDER — LEVOFLOXACIN 750 MG PO TABS: 750.0000 mg | ORAL_TABLET | ORAL | Status: DC

## 2014-04-11 NOTE — Progress Notes (Signed)
CARE MANAGEMENT NOTE 04/11/2014  Patient:  Craig [name], Craig [name]   Account Number:  [id number]  Date Initiated:  04/11/2014  Documentation initiated by:  [facility]  Subjective/Objective Assessment:   HCAP (healthcare-associated pneumonia)     Action/Plan:   lives at home with dtr- [name]   Anticipated DC Date:  04/11/2014   Anticipated DC Plan:  HOME/SELF CARE      DC Planning Services  CM consult      Choice offered to / List presented to:             Status of service:  Completed, signed off Medicare Important Message given?  YES (If response is "NO", the following Medicare IM given date fields will be blank) Date Medicare IM given:  04/11/2014 Medicare IM given by:  [facility] Date Additional Medicare IM given:   Additional Medicare IM given by:    Discharge Disposition:  HOME/SELF CARE  Per UR Regulation:    If discussed at Long Length of Stay Meetings, dates discussed:    Comments:  04/11/2014 1045 NCM spoke to pt and states he is independent at home. Does not need RW or any DME at this time. Dtr at home to assist with his care. No NCM needs identified. Isidoro Donning RN CCM Case Mgmt phone [phone]

## 2014-04-11 NOTE — Discharge Summary (Signed)
Physician Discharge Summary  Craig [name] ZOX:[id number] DOB: 1919-01-07 DOA: 04/09/2014  PCP: [name], MD  Admit date: 04/09/2014 Discharge date: 04/11/2014  Time spent: 35 minutes  Recommendations for Outpatient Follow-up:  1. Follow up with PCP in 1-2 weeks  Discharge Diagnoses:  Principal Problem:   HCAP (healthcare-associated pneumonia) Active Problems:   Hypertension   Chronic systolic CHF (congestive heart failure)   Atrial fibrillation   Respiratory failure with hypoxia  Discharge Condition: Improved  Diet recommendation: Regular  Filed Weights   04/09/14 1100  Weight: 84 kg (185 lb 3 oz)    History of present illness:  Please see admit h and p from 9/5 for details. Briefly, pt presents with increased sob and cough. Pt was noted to have leukocytosis with findings concerning for pna. Pt was admitted for further work up.  Hospital Course:  Respiratory failure with hypoxia HCAP (healthcare-associated pneumonia)  - Wt was started on vancomycin cefepime empirically  - Pt remained stable, will transition to PO abx  - Patient is currently not wheezing use guaifenesin for cough  - O2 as needed  Chronic renal failure stage III:  - Currently at baseline continue current home meds.  Hypertension:  - BP remained stable - Continued current home meds  Chronic systolic CHF (congestive heart failure)  - Euvolemic continue Lasix, valsartan, imdur.  Atrial fibrillation  - Rate controlled will continue metoprolol and Eluquis.   Consultations:  none  Discharge Exam: Filed Vitals:   04/10/14 1536 04/10/14 2104 04/11/14 0454 04/11/14 0937  BP: 106/56 113/70 133/78 121/78  Pulse: 69 79 73   Temp: 98 F (36.7 C) 98.9 F (37.2 C) 98.3 F (36.8 C)   TempSrc: Oral Oral Oral   Resp: Height:      Weight:      SpO2: 100% 97% 96%     General: Awake, in nad Cardiovascular: regular, s1, s2 Respiratory: normal resp effort, no wheezing  Discharge  Instructions     Medication List         allopurinol 300 MG tablet  Commonly known as:  ZYLOPRIM  Take 450 mg by mouth daily.     amLODipine 5 MG tablet  Commonly known as:  NORVASC  Take 5 mg by mouth daily.     atorvastatin 10 MG tablet  Commonly known as:  LIPITOR  Take 10 mg by mouth daily at 6 PM.     COQ10 PO  Take 1 capsule by mouth daily.     ELIQUIS 2.5 MG Tabs tablet  Generic drug:  apixaban  Take 2.5 mg by mouth 2 (two) times daily.     finasteride 5 MG tablet  Commonly known as:  PROSCAR  Take 5 mg by mouth daily.     furosemide 20 MG tablet  Commonly known as:  LASIX  Take 40 mg by mouth daily.     isosorbide mononitrate 60 MG 24 hr tablet  Commonly known as:  IMDUR  Take 60 mg by mouth daily.     levofloxacin 750 MG tablet  Commonly known as:  LEVAQUIN  Take 1 tablet (750 mg total) by mouth every other day.     levothyroxine 50 MCG tablet  Commonly known as:  SYNTHROID, LEVOTHROID  Take 50 mcg by mouth daily before breakfast.     losartan 50 MG tablet  Commonly known as:  COZAAR  Take 50 mg by mouth daily.     nitroGLYCERIN 0.4 MG SL tablet  Commonly known as:  NITROSTAT  Place 1 tablet (0.4 mg total) under the tongue every 5 (five) minutes as needed. x3 doses as needed for chest pain     Vitamin D-3 1000 UNITS Caps  Take 1,000 Units by mouth 2 (two) times daily.       Allergies  Allergen Reactions  . Zocor [Simvastatin] Other (See Comments)    Unknown reaction per pt   Follow-up Information   Follow up with [name], MD. Schedule an appointment as soon as possible for a visit in 1 week.   Specialty:  Family Medicine   Contact information:   [phone] [place] [address] [phone]        The results of significant diagnostics from this hospitalization (including imaging, microbiology, ancillary and laboratory) are listed below for reference.    Significant Diagnostic Studies: Dg Chest 2  View (if Patient Has Fever And/or Copd)  04/09/2014   CLINICAL DATA:  Cough  EXAM: CHEST  2 VIEW  COMPARISON:  03/29/2014  FINDINGS: Mild left basilar opacity, likely atelectasis. No focal consolidation. No pleural effusion or pneumothorax.  The heart is normal in size.  Left subclavian pacemaker.  Mild degenerative changes of the visualized thoracolumbar spine.  IMPRESSION: Mild left basilar opacity, likely atelectasis.   Electronically Signed   By: Charline Bills M.D.   On: 04/09/2014 11:09   Dg Chest 2 View  03/29/2014   CLINICAL DATA:  Post pacemaker implantation  EXAM: CHEST  2 VIEW  COMPARISON:  03/17/2014; 11/12/2013  FINDINGS: Grossly unchanged borderline enlarged cardiac silhouette and mediastinal contours. Interval placement of a left anterior chest wall single lead pacemaker with lead tip overlying the expected location of the right ventricle. Mild pulmonary venous congestion without frank evidence of edema. Grossly unchanged perihilar opacities favored to represent atelectasis. Nipple shadows overlie the bilateral lower lungs. Interval development of trace bilateral effusions. Slight worsening of bibasilar opacities, likely atelectasis. Unchanged bones.  IMPRESSION: 1. Interval placement of left anterior chest wall single lead pacemaker without evidence of complication. Specifically, no evidence of pneumothorax. 2. Pulmonary venous congestion without frank evidence of edema. 3. Interval development of small effusions and worsening bibasilar atelectasis.   Electronically Signed   By: Simonne Come M.D.   On: 03/29/2014 07:53   Dg Chest 2 View  03/18/2014   CLINICAL DATA:  Weakness.  Hypertension.  EXAM: CHEST  2 VIEW  COMPARISON:  Chest x-ray 11/12/2013.  FINDINGS: Lung volumes are normal. No consolidative airspace disease. No pleural effusions. No pneumothorax. No pulmonary nodule or mass noted. Pulmonary vasculature and the cardiomediastinal silhouette are within normal limits. Atherosclerosis in  the thoracic aorta.  IMPRESSION: 1.  No radiographic evidence of acute cardiopulmonary disease. 2. Atherosclerosis.   Electronically Signed   By: Trudie Reed M.D.   On: 03/18/2014 01:11    Microbiology: Recent Results (from the past 240 hour(s))  CULTURE, BLOOD (ROUTINE X 2)     Status: None   Collection Time    04/09/14 10:26 AM      Result Value Ref Range Status   Specimen Description BLOOD RIGHT WRIST   Final   Special Requests BOTTLES DRAWN AEROBIC AND ANAEROBIC 5CC   Final   Culture  Setup Time     Final   Value: 04/10/2014 02:08     Performed at [facility]   Culture     Final   Value:        BLOOD CULTURE  RECEIVED NO GROWTH TO DATE CULTURE WILL BE HELD FOR 5 DAYS BEFORE ISSUING A FINAL NEGATIVE REPORT     Performed at [facility]   Report Status PENDING   Incomplete  CULTURE, BLOOD (ROUTINE X 2)     Status: None   Collection Time    04/09/14 10:31 AM      Result Value Ref Range Status   Specimen Description BLOOD RIGHT HAND   Final   Special Requests BOTTLES DRAWN AEROBIC AND ANAEROBIC 10CC   Final   Culture  Setup Time     Final   Value: 04/10/2014 02:08     Performed at [facility]   Culture     Final   Value:        BLOOD CULTURE RECEIVED NO GROWTH TO DATE CULTURE WILL BE HELD FOR 5 DAYS BEFORE ISSUING A FINAL NEGATIVE REPORT     Performed at [facility]   Report Status PENDING   Incomplete     Labs: Basic Metabolic Panel:  Recent Labs Lab 04/09/14 0936 04/10/14 0500  NA 135* 137  K 4.2 4.5  CL 98 99  CO2 21 24  GLUCOSE 125* 122*  BUN 22 29*  CREATININE 1.42* 1.67*  CALCIUM 9.1 8.6   Liver Function Tests:  Recent Labs Lab 04/10/14 0500  AST 25  ALT 21  ALKPHOS 73  BILITOT 0.6  PROT 6.9  ALBUMIN 3.1*   No results found for this basename: LIPASE, AMYLASE,  in the last 168 hours No results found for this basename: AMMONIA,  in the last 168 hours CBC:  Recent Labs Lab 04/09/14 0936 04/10/14 0500  04/11/14 0422  WBC 13.1* 9.7 9.8  HGB 14.3 13.4 12.6*  HCT 40.6 38.7* 36.4*  MCV 84.6 84.5 84.1  PLT 152 161 171   Cardiac Enzymes: No results found for this basename: CKTOTAL, CKMB, CKMBINDEX, TROPONINI,  in the last 168 hours BNP: BNP (last 3 results)  Recent Labs  04/09/14 0936  PROBNP 2300.0*   CBG: No results found for this basename: GLUCAP,  in the last 168 hours  Signed:  Honi Name K  [facility] 04/11/2014, 11:21 AM

## 2014-04-11 NOTE — Discharge Instructions (Signed)
Next dose of Levaquin on 9/8, then take every other day  Information on my medicine - ELIQUIS (apixaban)  This medication education was reviewed with me or my healthcare representative as part of my discharge preparation.  The pharmacist that spoke with me during my hospital stay was:  Newton Pigg, RPH  Why was Eliquis prescribed for you? Eliquis was prescribed for you to reduce the risk of a blood clot forming that can cause a stroke if you have a medical condition called atrial fibrillation (a type of irregular heartbeat).  What do You need to know about Eliquis ? Take your Eliquis TWICE DAILY - one tablet in the morning and one tablet in the evening with or without food. If you have difficulty swallowing the tablet whole please discuss with your pharmacist how to take the medication safely.  Take Eliquis exactly as prescribed by your doctor and DO NOT stop taking Eliquis without talking to the doctor who prescribed the medication.  Stopping may increase your risk of developing a stroke.  Refill your prescription before you run out.  After discharge, you should have regular check-up appointments with your healthcare provider that is prescribing your Eliquis.  In the future your dose may need to be changed if your kidney function or weight changes by a significant amount or as you get older.  What do you do if you miss a dose? If you miss a dose, take it as soon as you remember on the same day and resume taking twice daily.  Do not take more than one dose of ELIQUIS at the same time to make up a missed dose.  Important Safety Information A possible side effect of Eliquis is bleeding. You should call your healthcare provider right away if you experience any of the following:   Bleeding from an injury or your nose that does not stop.   Unusual colored urine (red or dark brown) or unusual colored stools (red or black).   Unusual bruising for unknown reasons.   A serious fall or  if you hit your head (even if there is no bleeding).  Some medicines may interact with Eliquis and might increase your risk of bleeding or clotting while on Eliquis. To help avoid this, consult your healthcare provider or pharmacist prior to using any new prescription or non-prescription medications, including herbals, vitamins, non-steroidal anti-inflammatory drugs (NSAIDs) and supplements.  This website has more information on Eliquis (apixaban): http://www.eliquis.com/eliquis/home

## 2014-04-12 NOTE — Progress Notes (Signed)
[facility] PROGRESS NOTE  Craig [name] AOZ:[id number] DOB: 05-24-1919 DOA: 04/09/2014 PCP: [name], MD  Assessment/Plan: Respiratory failure with hypoxia HCAP (healthcare-associated pneumonia)  - Initially started on vancomycin cefepime empirically - Tolerating PO abx - Patient not wheezing use guaifenesin for cough  - O2 as needed Chronic renal failure stage III:  - Currently at baseline continue current home meds.  Hypertension:  - BP currently stable continue current home meds  Chronic systolic CHF (congestive heart failure)  - Euvolemic continue Lasix, valsartan, imdur.  Atrial fibrillation  - Rate controlled will continue metoprolol and Eluquis.  Mental Status Change - Discharge was cancelled on 9/8 secondary to family's concern about possible confusion - Pt remains mildly confused this AM - Suspect underlying dementia vs resolving encephalopathy from PNA  Code Status: Full Family Communication: Pt in room, daughter over phone Disposition Plan: Home today with daughter   Consultants:    Procedures:    Antibiotics:  vanc 9/5-9/6  Cefepime 9/5-9/6  Levaquin 9/6>>>   HPI/Subjective: Pt is without complaints. Eager to go home  Objective: Filed Vitals:   04/11/14 1331 04/11/14 2206 04/12/14 0531 04/12/14 0949  BP: 107/55 120/67 106/56 118/74  Pulse: 80 76 60   Temp: 98 F (36.7 C) 98.4 F (36.9 C) 97.5 F (36.4 C)   TempSrc: Oral Oral Oral   Resp: Height:      Weight:      SpO2: 95% 97% 99%     Intake/Output Summary (Last 24 hours) at 04/12/14 1026 Last data filed at 04/12/14 0901  Gross per 24 hour  Intake    240 ml  Output    925 ml  Net   -685 ml   Filed Weights   04/09/14 1100  Weight: 84 kg (185 lb 3 oz)    Exam:   General:  Awake, in nad  Cardiovascular: regular, s1, s2  Respiratory: normal resp effort, no wheezing  Abdomen: soft,nondistended  Musculoskeletal: perfused, no clubbing   Data  Reviewed: Basic Metabolic Panel:  Recent Labs Lab 04/09/14 0936 04/10/14 0500  NA 135* 137  K 4.2 4.5  CL 98 99  CO2 21 24  GLUCOSE 125* 122*  BUN 22 29*  CREATININE 1.42* 1.67*  CALCIUM 9.1 8.6   Liver Function Tests:  Recent Labs Lab 04/10/14 0500  AST 25  ALT 21  ALKPHOS 73  BILITOT 0.6  PROT 6.9  ALBUMIN 3.1*   No results found for this basename: LIPASE, AMYLASE,  in the last 168 hours No results found for this basename: AMMONIA,  in the last 168 hours CBC:  Recent Labs Lab 04/09/14 0936 04/10/14 0500 04/11/14 0422  WBC 13.1* 9.7 9.8  HGB 14.3 13.4 12.6*  HCT 40.6 38.7* 36.4*  MCV 84.6 84.5 84.1  PLT 152 161 171   Cardiac Enzymes: No results found for this basename: CKTOTAL, CKMB, CKMBINDEX, TROPONINI,  in the last 168 hours BNP (last 3 results)  Recent Labs  04/09/14 0936  PROBNP 2300.0*   CBG: No results found for this basename: GLUCAP,  in the last 168 hours  Recent Results (from the past 240 hour(s))  CULTURE, BLOOD (ROUTINE X 2)     Status: None   Collection Time    04/09/14 10:26 AM      Result Value Ref Range Status   Specimen Description BLOOD RIGHT WRIST   Final   Special Requests BOTTLES DRAWN AEROBIC AND ANAEROBIC 5CC   Final  Culture  Setup Time     Final   Value: 04/10/2014 02:08     Performed at [facility]   Culture     Final   Value:        BLOOD CULTURE RECEIVED NO GROWTH TO DATE CULTURE WILL BE HELD FOR 5 DAYS BEFORE ISSUING A FINAL NEGATIVE REPORT     Performed at [facility]   Report Status PENDING   Incomplete  CULTURE, BLOOD (ROUTINE X 2)     Status: None   Collection Time    04/09/14 10:31 AM      Result Value Ref Range Status   Specimen Description BLOOD RIGHT HAND   Final   Special Requests BOTTLES DRAWN AEROBIC AND ANAEROBIC 10CC   Final   Culture  Setup Time     Final   Value: 04/10/2014 02:08     Performed at [facility]   Culture     Final   Value:        BLOOD CULTURE RECEIVED  NO GROWTH TO DATE CULTURE WILL BE HELD FOR 5 DAYS BEFORE ISSUING A FINAL NEGATIVE REPORT     Performed at [facility]   Report Status PENDING   Incomplete     Studies: No results found.  Scheduled Meds: . allopurinol  450 mg Oral Daily  . amLODipine  5 mg Oral Daily  . apixaban  2.5 mg Oral BID  . atorvastatin  10 mg Oral q1800  . finasteride  5 mg Oral Daily  . furosemide  40 mg Oral Q breakfast  . isosorbide mononitrate  60 mg Oral Daily  . levofloxacin  750 mg Oral Q48H  . levothyroxine  50 mcg Oral QAC breakfast  . losartan  50 mg Oral Daily  . sodium chloride  3 mL Intravenous Q12H   Continuous Infusions:   Principal Problem:   HCAP (healthcare-associated pneumonia) Active Problems:   Hypertension   Chronic systolic CHF (congestive heart failure)   Atrial fibrillation   Respiratory failure with hypoxia  Time spent:  Duquan Gillooly K  [facility] Pager [phone]. If 7PM-7AM, please contact night-coverage at [email or web address], password [facility] 04/12/2014, 10:26 AM  LOS: 3 days

## 2014-04-12 NOTE — Discharge Summary (Signed)
Craig [name] to be D/C'd Home per MD order.  Discussed with the patient and all questions fully answered.    Medication List         allopurinol 300 MG tablet  Commonly known as:  ZYLOPRIM  Take 450 mg by mouth daily.     amLODipine 5 MG tablet  Commonly known as:  NORVASC  Take 5 mg by mouth daily.     atorvastatin 10 MG tablet  Commonly known as:  LIPITOR  Take 10 mg by mouth daily at 6 PM.     COQ10 PO  Take 1 capsule by mouth daily.     ELIQUIS 2.5 MG Tabs tablet  Generic drug:  apixaban  Take 2.5 mg by mouth 2 (two) times daily.     finasteride 5 MG tablet  Commonly known as:  PROSCAR  Take 5 mg by mouth daily.     furosemide 20 MG tablet  Commonly known as:  LASIX  Take 40 mg by mouth daily.     isosorbide mononitrate 60 MG 24 hr tablet  Commonly known as:  IMDUR  Take 60 mg by mouth daily.     levofloxacin 750 MG tablet  Commonly known as:  LEVAQUIN  Take 1 tablet (750 mg total) by mouth every other day.     levothyroxine 50 MCG tablet  Commonly known as:  SYNTHROID, LEVOTHROID  Take 50 mcg by mouth daily before breakfast.     losartan 50 MG tablet  Commonly known as:  COZAAR  Take 50 mg by mouth daily.     nitroGLYCERIN 0.4 MG SL tablet  Commonly known as:  NITROSTAT  Place 1 tablet (0.4 mg total) under the tongue every 5 (five) minutes as needed. x3 doses as needed for chest pain     Vitamin D-3 1000 UNITS Caps  Take 1,000 Units by mouth 2 (two) times daily.        VVS, Skin clean, dry and intact without evidence of skin break down, no evidence of skin tears noted. IV catheter discontinued intact. Site without signs and symptoms of complications. Dressing and pressure applied.  An After Visit Summary was printed and given to the patient.  D/c education completed with patient/family including follow up instructions, medication list, d/c activities limitations if indicated, with other d/c instructions as indicated by MD - patient able to  verbalize understanding, all questions fully answered.   Patient instructed to return to ED, call 911, or call MD for any changes in condition.   Patient escorted via WC, and D/C home via private auto.  [name] F 04/12/2014 1:28 PM

## 2014-04-12 NOTE — Care Management Note (Signed)
    Page 1 of 1   04/12/2014     11:29:16 AM CARE MANAGEMENT NOTE 04/12/2014  Patient:  Craig [name], Craig [name]   Account Number:  [id number]  Date Initiated:  04/11/2014  Documentation initiated by:  Isidoro Donning  Subjective/Objective Assessment:   HCAP (healthcare-associated pneumonia)     Action/Plan:   lives at home with dtr- [name]   Anticipated DC Date:  04/12/2014   Anticipated DC Plan:  HOME/SELF CARE      DC Planning Services  CM consult      Choice offered to / List presented to:             Status of service:  Completed, signed off Medicare Important Message given?  YES (If response is "NO", the following Medicare IM given date fields will be blank) Date Medicare IM given:  04/11/2014 Medicare IM given by:  [facility] Date Additional Medicare IM given:   Additional Medicare IM given by:    Discharge Disposition:  HOME/SELF CARE  Per UR Regulation:  Reviewed for med. necessity/level of care/duration of stay  If discussed at Long Length of Stay Meetings, dates discussed:    Comments:  04/12/14 1122 Letha Cape RN, BSN [phone] patient is for dc today, no needs anticipated.  04/11/2014 1045 NCM spoke to pt and states he is independent at home. Does not need RW or any DME at this time. Dtr at home to assist with his care. No NCM needs identified. Isidoro Donning RN CCM Case Mgmt phone [phone]

## 2014-04-16 ENCOUNTER — Encounter (HOSPITAL_COMMUNITY): Payer: Self-pay | Admitting: Emergency Medicine

## 2014-04-16 ENCOUNTER — Emergency Department (HOSPITAL_COMMUNITY)
Admission: EM | Admit: 2014-04-16 | Discharge: 2014-04-17 | Disposition: A | Discharge status: Home / Self Care | Payer: Medicare Other | Attending: Emergency Medicine | Admitting: Emergency Medicine

## 2014-04-16 ENCOUNTER — Emergency Department (HOSPITAL_COMMUNITY): Payer: Medicare Other

## 2014-04-16 DIAGNOSIS — R0609 Other forms of dyspnea: Secondary | ICD-10-CM | POA: Insufficient documentation

## 2014-04-16 DIAGNOSIS — R05 Cough: Secondary | ICD-10-CM

## 2014-04-16 DIAGNOSIS — R5383 Other fatigue: Secondary | ICD-10-CM | POA: Diagnosis not present

## 2014-04-16 DIAGNOSIS — Z8659 Personal history of other mental and behavioral disorders: Secondary | ICD-10-CM | POA: Insufficient documentation

## 2014-04-16 DIAGNOSIS — E785 Hyperlipidemia, unspecified: Secondary | ICD-10-CM | POA: Insufficient documentation

## 2014-04-16 DIAGNOSIS — J45901 Unspecified asthma with (acute) exacerbation: Secondary | ICD-10-CM | POA: Insufficient documentation

## 2014-04-16 DIAGNOSIS — I4891 Unspecified atrial fibrillation: Secondary | ICD-10-CM | POA: Insufficient documentation

## 2014-04-16 DIAGNOSIS — R0989 Other specified symptoms and signs involving the circulatory and respiratory systems: Principal | ICD-10-CM | POA: Insufficient documentation

## 2014-04-16 DIAGNOSIS — Z7902 Long term (current) use of antithrombotics/antiplatelets: Secondary | ICD-10-CM | POA: Diagnosis not present

## 2014-04-16 DIAGNOSIS — I5022 Chronic systolic (congestive) heart failure: Secondary | ICD-10-CM | POA: Diagnosis not present

## 2014-04-16 DIAGNOSIS — J811 Chronic pulmonary edema: Secondary | ICD-10-CM | POA: Diagnosis not present

## 2014-04-16 DIAGNOSIS — I10 Essential (primary) hypertension: Secondary | ICD-10-CM | POA: Insufficient documentation

## 2014-04-16 DIAGNOSIS — R059 Cough, unspecified: Secondary | ICD-10-CM | POA: Diagnosis not present

## 2014-04-16 DIAGNOSIS — Z87891 Personal history of nicotine dependence: Secondary | ICD-10-CM | POA: Diagnosis not present

## 2014-04-16 DIAGNOSIS — I2510 Atherosclerotic heart disease of native coronary artery without angina pectoris: Secondary | ICD-10-CM | POA: Diagnosis not present

## 2014-04-16 DIAGNOSIS — R0600 Dyspnea, unspecified: Secondary | ICD-10-CM

## 2014-04-16 DIAGNOSIS — R0602 Shortness of breath: Secondary | ICD-10-CM | POA: Diagnosis not present

## 2014-04-16 DIAGNOSIS — H9190 Unspecified hearing loss, unspecified ear: Secondary | ICD-10-CM | POA: Insufficient documentation

## 2014-04-16 DIAGNOSIS — Z9889 Other specified postprocedural states: Secondary | ICD-10-CM | POA: Insufficient documentation

## 2014-04-16 DIAGNOSIS — M109 Gout, unspecified: Secondary | ICD-10-CM | POA: Diagnosis not present

## 2014-04-16 DIAGNOSIS — R5381 Other malaise: Secondary | ICD-10-CM | POA: Diagnosis not present

## 2014-04-16 DIAGNOSIS — Z79899 Other long term (current) drug therapy: Secondary | ICD-10-CM | POA: Insufficient documentation

## 2014-04-16 LAB — CULTURE, BLOOD (ROUTINE X 2)
Culture: NO GROWTH
Culture: NO GROWTH

## 2014-04-16 LAB — CBC
HEMATOCRIT: 36.9 % — AB (ref 39.0–52.0)
HEMOGLOBIN: 12.9 g/dL — AB (ref 13.0–17.0)
MCH: 29.3 pg (ref 26.0–34.0)
MCHC: 35 g/dL (ref 30.0–36.0)
MCV: 83.9 fL (ref 78.0–100.0)
Platelets: 223 10*3/uL (ref 150–400)
RBC: 4.4 MIL/uL (ref 4.22–5.81)
RDW: 14.4 % (ref 11.5–15.5)
WBC: 10.3 10*3/uL (ref 4.0–10.5)

## 2014-04-16 LAB — BASIC METABOLIC PANEL
Anion gap: 12 (ref 5–15)
BUN: 27 mg/dL — ABNORMAL HIGH (ref 6–23)
CO2: 25 meq/L (ref 19–32)
Calcium: 9.2 mg/dL (ref 8.4–10.5)
Chloride: 99 mEq/L (ref 96–112)
Creatinine, Ser: 1.55 mg/dL — ABNORMAL HIGH (ref 0.50–1.35)
GFR calc Af Amer: 43 mL/min — ABNORMAL LOW (ref >90)
GFR calc non Af Amer: 37 mL/min — ABNORMAL LOW (ref >90)
GLUCOSE: 152 mg/dL — AB (ref 70–99)
POTASSIUM: 4.2 meq/L (ref 3.7–5.3)
SODIUM: 136 meq/L — AB (ref 137–147)

## 2014-04-16 LAB — URINALYSIS, ROUTINE W REFLEX MICROSCOPIC
Bilirubin Urine: NEGATIVE
Glucose, UA: NEGATIVE mg/dL
Hgb urine dipstick: NEGATIVE
Ketones, ur: NEGATIVE mg/dL
LEUKOCYTES UA: NEGATIVE
NITRITE: NEGATIVE
PROTEIN: NEGATIVE mg/dL
SPECIFIC GRAVITY, URINE: 1.012 (ref 1.005–1.030)
UROBILINOGEN UA: 0.2 mg/dL (ref 0.0–1.0)
pH: 6 (ref 5.0–8.0)

## 2014-04-16 LAB — I-STAT TROPONIN, ED: TROPONIN I, POC: 0.06 ng/mL (ref 0.00–0.08)

## 2014-04-16 LAB — PRO B NATRIURETIC PEPTIDE: PRO B NATRI PEPTIDE: 1130 pg/mL — AB (ref 0–450)

## 2014-04-16 NOTE — ED Notes (Signed)
Patient transported to X-ray 

## 2014-04-16 NOTE — ED Notes (Signed)
MD at bedside. 

## 2014-04-16 NOTE — Discharge Instructions (Signed)
Cough, Adult ° A cough is a reflex that helps clear your throat and airways. It can help heal the body or may be a reaction to an irritated airway. A cough may only last 2 or 3 weeks (acute) or may last more than 8 weeks (chronic).  °CAUSES °Acute cough: °· Viral or bacterial infections. °Chronic cough: °· Infections. °· Allergies. °· Asthma. °· Post-nasal drip. °· Smoking. °· Heartburn or acid reflux. °· Some medicines. °· Chronic lung problems (COPD). °· Cancer. °SYMPTOMS  °· Cough. °· Fever. °· Chest pain. °· Increased breathing rate. °· High-pitched whistling sound when breathing (wheezing). °· Colored mucus that you cough up (sputum). °TREATMENT  °· A bacterial cough may be treated with antibiotic medicine. °· A viral cough must run its course and will not respond to antibiotics. °· Your caregiver may recommend other treatments if you have a chronic cough. °HOME CARE INSTRUCTIONS  °· Only take over-the-counter or prescription medicines for pain, discomfort, or fever as directed by your caregiver. Use cough suppressants only as directed by your caregiver. °· Use a cold steam vaporizer or humidifier in your bedroom or home to help loosen secretions. °· Sleep in a semi-upright position if your cough is worse at night. °· Rest as needed. °· Stop smoking if you smoke. °SEEK IMMEDIATE MEDICAL CARE IF:  °· You have pus in your sputum. °· Your cough starts to worsen. °· You cannot control your cough with suppressants and are losing sleep. °· You begin coughing up blood. °· You have difficulty breathing. °· You develop pain which is getting worse or is uncontrolled with medicine. °· You have a fever. °MAKE SURE YOU:  °· Understand these instructions. °· Will watch your condition. °· Will get help right away if you are not doing well or get worse. °Document Released: 01/18/2011 Document Revised: 10/14/2011 Document Reviewed: 01/18/2011 °[employer]® Patient Information ©2015 [employer]. This information is not intended  to replace advice given to you by your health care provider. Make sure you discuss any questions you have with your health care provider. ° ° ° °Heart Failure °Heart failure is a condition in which the heart has trouble pumping blood. This means your heart does not pump blood efficiently for your body to work well. In some cases of heart failure, fluid may back up into your lungs or you may have swelling (edema) in your lower legs. Heart failure is usually a long-term (chronic) condition. It is important for you to take good care of yourself and follow your health care provider's treatment plan. °CAUSES  °Some health conditions can cause heart failure. Those health conditions include: °· High blood pressure (hypertension). Hypertension causes the heart muscle to work harder than normal. When pressure in the blood vessels is high, the heart needs to pump (contract) with more force in order to circulate blood throughout the body. High blood pressure eventually causes the heart to become stiff and weak. °· Coronary artery disease (CAD). CAD is the buildup of cholesterol and fat (plaque) in the arteries of the heart. The blockage in the arteries deprives the heart muscle of oxygen and blood. This can cause chest pain and may lead to a heart attack. High blood pressure can also contribute to CAD. °· Heart attack (myocardial infarction). A heart attack occurs when one or more arteries in the heart become blocked. The loss of oxygen damages the muscle tissue of the heart. When this happens, part of the heart muscle dies. The injured tissue does not   contract as well and weakens the heart's ability to pump blood. °· Abnormal heart valves. When the heart valves do not open and close properly, it can cause heart failure. This makes the heart muscle pump harder to keep the blood flowing. °· Heart muscle disease (cardiomyopathy or myocarditis). Heart muscle disease is damage to the heart muscle from a variety of causes. These  can include drug or alcohol abuse, infections, or unknown reasons. These can increase the risk of heart failure. °· Lung disease. Lung disease makes the heart work harder because the lungs do not work properly. This can cause a strain on the heart, leading it to fail. °· Diabetes. Diabetes increases the risk of heart failure. High blood sugar contributes to high fat (lipid) levels in the blood. Diabetes can also cause slow damage to tiny blood vessels that carry important nutrients to the heart muscle. When the heart does not get enough oxygen and food, it can cause the heart to become weak and stiff. This leads to a heart that does not contract efficiently. °· Other conditions can contribute to heart failure. These include abnormal heart rhythms, thyroid problems, and low blood counts (anemia). °Certain unhealthy behaviors can increase the risk of heart failure, including: °· Being overweight. °· Smoking or chewing tobacco. °· Eating foods high in fat and cholesterol. °· Abusing illicit drugs or alcohol. °· Lacking physical activity. °SYMPTOMS  °Heart failure symptoms may vary and can be hard to detect. Symptoms may include: °· Shortness of breath with activity, such as climbing stairs. °· Persistent cough. °· Swelling of the feet, ankles, legs, or abdomen. °· Unexplained weight gain. °· Difficulty breathing when lying flat (orthopnea). °· Waking from sleep because of the need to sit up and get more air. °· Rapid heartbeat. °· Fatigue and loss of energy. °· Feeling light-headed, dizzy, or close to fainting. °· Loss of appetite. °· Nausea. °· Increased urination during the night (nocturia). °DIAGNOSIS  °A diagnosis of heart failure is based on your history, symptoms, physical examination, and diagnostic tests. Diagnostic tests for heart failure may include: °· Echocardiography. °· Electrocardiography. °· Chest X-ray. °· Blood tests. °· Exercise stress test. °· Cardiac angiography. °· Radionuclide scans. °TREATMENT    °Treatment is aimed at managing the symptoms of heart failure. Medicines, behavioral changes, or surgical intervention may be necessary to treat heart failure. °· Medicines to help treat heart failure may include: °¨ Angiotensin-converting enzyme (ACE) inhibitors. This type of medicine blocks the effects of a blood protein called angiotensin-converting enzyme. ACE inhibitors relax (dilate) the blood vessels and help lower blood pressure. °¨ Angiotensin receptor blockers (ARBs). This type of medicine blocks the actions of a blood protein called angiotensin. Angiotensin receptor blockers dilate the blood vessels and help lower blood pressure. °¨ Water pills (diuretics). Diuretics cause the kidneys to remove salt and water from the blood. The extra fluid is removed through urination. This loss of extra fluid lowers the volume of blood the heart pumps. °¨ Beta blockers. These prevent the heart from beating too fast and improve heart muscle strength. °¨ Digitalis. This increases the force of the heartbeat. °· Healthy behavior changes include: °¨ Obtaining and maintaining a healthy weight. °¨ Stopping smoking or chewing tobacco. °¨ Eating heart-healthy foods. °¨ Limiting or avoiding alcohol. °¨ Stopping illicit drug use. °¨ Physical activity as directed by your health care provider. °· Surgical treatment for heart failure may include: °¨ A procedure to open blocked arteries, repair damaged heart valves, or remove damaged heart   muscle tissue. °¨ A pacemaker to improve heart muscle function and control certain abnormal heart rhythms. °¨ An internal cardioverter defibrillator to treat certain serious abnormal heart rhythms. °¨ A left ventricular assist device (LVAD) to assist the pumping ability of the heart. °HOME CARE INSTRUCTIONS  °· Take medicines only as directed by your health care provider. Medicines are important in reducing the workload of your heart, slowing the progression of heart failure, and improving your  symptoms. °¨ Do not stop taking your medicine unless directed by your health care provider. °¨ Do not skip any dose of medicine. °¨ Refill your prescriptions before you run out of medicine. Your medicines are needed every day. °· Engage in moderate physical activity if directed by your health care provider. Moderate physical activity can benefit some people. The elderly and people with severe heart failure should consult with a health care provider for physical activity recommendations. °· Eat heart-healthy foods. Food choices should be free of trans fat and low in saturated fat, cholesterol, and salt (sodium). Healthy choices include fresh or frozen fruits and vegetables, fish, lean meats, legumes, fat-free or low-fat dairy products, and whole grain or high fiber foods. Talk to a dietitian to learn more about heart-healthy foods. °· Limit sodium if directed by your health care provider. Sodium restriction may reduce symptoms of heart failure in some people. Talk to a dietitian to learn more about heart-healthy seasonings. °· Use healthy cooking methods. Healthy cooking methods include roasting, grilling, broiling, baking, poaching, steaming, or stir-frying. Talk to a dietitian to learn more about healthy cooking methods. °· Limit fluids if directed by your health care provider. Fluid restriction may reduce symptoms of heart failure in some people. °· Weigh yourself every day. Daily weights are important in the early recognition of excess fluid. You should weigh yourself every morning after you urinate and before you eat breakfast. Wear the same amount of clothing each time you weigh yourself. Record your daily weight. Provide your health care provider with your weight record. °· Monitor and record your blood pressure if directed by your health care provider. °· Check your pulse if directed by your health care provider. °· Lose weight if directed by your health care provider. Weight loss may reduce symptoms of heart  failure in some people. °· Stop smoking or chewing tobacco. Nicotine makes your heart work harder by causing your blood vessels to constrict. Do not use nicotine gum or patches before talking to your health care provider. °· Keep all follow-up visits as directed by your health care provider. This is important. °· Limit alcohol intake to no more than 1 drink per day for nonpregnant women and 2 drinks per day for men. One drink equals 12 ounces of beer, 5 ounces of wine, or 1½ ounces of hard liquor. Drinking more than that is harmful to your heart. Tell your health care provider if you drink alcohol several times a week. Talk with your health care provider about whether alcohol is safe for you. If your heart has already been damaged by alcohol or you have severe heart failure, drinking alcohol should be stopped completely. °· Stop illicit drug use. °· Stay up-to-date with immunizations. It is especially important to prevent respiratory infections through current pneumococcal and influenza immunizations. °· Manage other health conditions such as hypertension, diabetes, thyroid disease, or abnormal heart rhythms as directed by your health care provider. °· Learn to manage stress. °· Plan rest periods when fatigued. °· Learn strategies to manage high temperatures.   If the weather is extremely hot: °¨ Avoid vigorous physical activity. °¨ Use air conditioning or fans or seek a cooler location. °¨ Avoid caffeine and alcohol. °¨ Wear loose-fitting, lightweight, and light-colored clothing. °· Learn strategies to manage cold temperatures. If the weather is extremely cold: °¨ Avoid vigorous physical activity. °¨ Layer clothes. °¨ Wear mittens or gloves, a hat, and a scarf when going outside. °¨ Avoid alcohol. °· Obtain ongoing education and support as needed. °· Participate in or seek rehabilitation as needed to maintain or improve independence and quality of life. °SEEK MEDICAL CARE IF:  °· Your weight increases by 03 lb/1.4 kg  in 1 day or 05 lb/2.3 kg in a week. °· You have increasing shortness of breath that is unusual for you. °· You are unable to participate in your usual physical activities. °· You tire easily. °· You cough more than normal, especially with physical activity. °· You have any or more swelling in areas such as your hands, feet, ankles, or abdomen. °· You are unable to sleep because it is hard to breathe. °· You feel like your heart is beating fast (palpitations). °· You become dizzy or light-headed upon standing up. °SEEK IMMEDIATE MEDICAL CARE IF:  °· You have difficulty breathing. °· There is a change in mental status such as decreased alertness or difficulty with concentration. °· You have a pain or discomfort in your chest. °· You have an episode of fainting (syncope). °MAKE SURE YOU:  °· Understand these instructions. °· Will watch your condition. °· Will get help right away if you are not doing well or get worse. °Document Released: 07/22/2005 Document Revised: 12/06/2013 Document Reviewed: 08/21/2012 °[employer]® Patient Information ©2015 [employer]. This information is not intended to replace advice given to you by your health care provider. Make sure you discuss any questions you have with your health care provider. ° °

## 2014-04-16 NOTE — ED Notes (Signed)
Pt reports that he recently was seeing here for similar symptoms. Pt c/o intermittently feeling weak and short of breath. Pt talking in complete sentences without difficulty. Pt c/o generalized pain.

## 2014-04-16 NOTE — ED Provider Notes (Signed)
CSN: [id number]     Arrival date & time 04/16/14  1902 History   First MD Initiated Contact with Patient 04/16/14 2028     Chief Complaint  Patient presents with  . Shortness of Breath  . Weakness     (Consider location/radiation/quality/duration/timing/severity/associated sxs/prior Treatment) Patient is a 80 y.o. male presenting with general illness.  Illness Location:  Generalized Quality:  Weakness, cough Severity:  Moderate Onset quality:  Gradual Duration:  1 day Timing:  Constant Progression:  Unchanged Chronicity:  New Context:  Recent treatment for PNA with levaquin Relieved by:  Nothing Worsened by:  Stress Associated symptoms: cough and shortness of breath   Associated symptoms: no abdominal pain, no chest pain, no congestion, no diarrhea, no fever, no headaches, no nausea, no rash, no rhinorrhea, no sore throat and no vomiting     Past Medical History  Diagnosis Date  . Mitral insufficiency     chronically moderate to severe  . Hypertension   . Hyperlipidemia   . 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 Coronary artery disease     a. Remote lateral infarct;  b. 09/2011 MV: large dense inferolat/lat scar - ? very slight peri-infarcrt isch.  No signif change c/w 09/2010 scan.  . Raynaud's disease /phenomenon   . Hard of hearing     bilaterally  . Gout   . PTSD (post-traumatic stress disorder)     "being treated for it now but not a big degree"  . Asthma   . Noncompliance with medication regimen   . Chronic systolic CHF (congestive heart failure)     a. 10/2011 Echo: EF 40-45%, inf AK, PASP .  Marland Kitchen Persistent atrial fibrillation    Past Surgical History  Procedure Laterality Date  . Prostatectomy    . Tonsillectomy    . Inguinal hernia repair      left  . Appendectomy    . Cardiac catheterization  2003    SHOWED OCCLUSION OF THE LEFT CIRCUMFLEX CORONARY ARTERY  . Pacemaker insertion  03/28/14    STJ single chamber pacemaker implanted by Dr Graciela Husbands for  symptomatic bradycardia   Family History  Problem Relation Age of Onset  . Heart attack Brother   . Alcohol abuse Father     died in his late 71's  . Other Mother     died in her late 45's - old age.   History  Substance Use Topics  . Smoking status: Former Smoker -- 1.50 packs/day for 20 years    Quit date: 12/12/1970  . Smokeless tobacco: Never Used  . Alcohol Use: No    Review of Systems  Constitutional: Negative for fever and chills.  HENT: Negative for congestion, rhinorrhea and sore throat.   Eyes: Negative for photophobia and visual disturbance.  Respiratory: Positive for cough and shortness of breath.   Cardiovascular: Negative for chest pain and leg swelling.  Gastrointestinal: Negative for nausea, vomiting, abdominal pain, diarrhea and constipation.  Endocrine: Negative for polydipsia and polyuria.  Genitourinary: Negative for dysuria and hematuria.  Musculoskeletal: Negative for arthralgias and back pain.  Skin: Negative for color change and rash.  Neurological: Negative for dizziness, syncope, light-headedness and headaches.  Hematological: Negative for adenopathy. Does not bruise/bleed easily.  All other systems reviewed and are negative.     Allergies  Zocor  Home Medications   Prior to Admission medications   Medication Sig Start Date End Date Taking? Authorizing Provider  allopurinol (ZYLOPRIM) 300  MG tablet Take 450 mg by mouth daily.     Historical Provider, MD  amLODipine (NORVASC) 5 MG tablet Take 5 mg by mouth daily.    Historical Provider, MD  apixaban (ELIQUIS) 2.5 MG TABS tablet Take 2.5 mg by mouth 2 (two) times daily.    Historical Provider, MD  atorvastatin (LIPITOR) 10 MG tablet Take 10 mg by mouth daily at 6 PM.    Historical Provider, MD  Cholecalciferol (VITAMIN D-3) 1000 UNITS CAPS Take 1,000 Units by mouth 2 (two) times daily.     Historical Provider, MD  Coenzyme Q10 (COQ10 PO) Take 1 capsule by mouth daily.     Historical Provider, MD   finasteride (PROSCAR) 5 MG tablet Take 5 mg by mouth daily.     Historical Provider, MD  furosemide (LASIX) 20 MG tablet Take 40 mg by mouth daily.    Historical Provider, MD  isosorbide mononitrate (IMDUR) 60 MG 24 hr tablet Take 60 mg by mouth daily.    Historical Provider, MD  levofloxacin (LEVAQUIN) 750 MG tablet Take 1 tablet (750 mg total) by mouth every other day. 04/11/14   Jerald Kief, MD  levothyroxine (SYNTHROID, LEVOTHROID) 50 MCG tablet Take 50 mcg by mouth daily before breakfast.    Historical Provider, MD  losartan (COZAAR) 50 MG tablet Take 50 mg by mouth daily.    Historical Provider, MD  nitroGLYCERIN (NITROSTAT) 0.4 MG SL tablet Place 1 tablet (0.4 mg total) under the tongue every 5 (five) minutes as needed. x3 doses as needed for chest pain 10/18/12   Ok Anis, NP   BP 119/74  Pulse 61  Temp(Src) 97.9 F (36.6 C) (Oral)  Resp 12  SpO2 100% Physical Exam  Vitals reviewed. Constitutional: He is oriented to person, place, and time. He appears well-developed and well-nourished.  HENT:  Head: Normocephalic and atraumatic.  Eyes: Conjunctivae and EOM are normal.  Neck: Normal range of motion. Neck supple.  Cardiovascular: Normal rate, regular rhythm and normal heart sounds.   Pulmonary/Chest: Effort normal. No respiratory distress. He has rales (bibasilar).  Abdominal: He exhibits no distension. There is no tenderness. There is no rebound and no guarding.  Musculoskeletal: Normal range of motion.  Neurological: He is alert and oriented to person, place, and time.  Skin: Skin is warm and dry.    ED Course  Procedures (including critical care time) Labs Review Labs Reviewed  CBC - Abnormal; Notable for the following:    Hemoglobin 12.9 (*)    HCT 36.9 (*)    All other components within normal limits  BASIC METABOLIC PANEL - Abnormal; Notable for the following:    Sodium 136 (*)    Glucose, Bld 152 (*)    BUN 27 (*)    Creatinine, Ser 1.55 (*)    GFR  calc non Af Amer 37 (*)    GFR calc Af Amer 43 (*)    All other components within normal limits  PRO B NATRIURETIC PEPTIDE - Abnormal; Notable for the following:    Pro B Natriuretic peptide (BNP) 1130.0 (*)    All other components within normal limits  URINALYSIS, ROUTINE W REFLEX MICROSCOPIC  I-STAT TROPOININ, ED    Imaging Review Dg Chest 2 View  04/16/2014   CLINICAL DATA:  Shortness of breath and weakness.  EXAM: CHEST  2 VIEW  COMPARISON:  Chest radiograph performed 04/09/2014  FINDINGS: The lungs are well-aerated. Vascular congestion is noted. Mildly increased interstitial markings on the right  side could reflect minimal interstitial edema or pneumonia. There is no evidence of pleural effusion or pneumothorax.  The heart is normal in size; the mediastinal contour is within normal limits. A pacemaker is noted at the left chest wall, with a single lead ending at the right ventricle. No acute osseous abnormalities are seen.  IMPRESSION: Vascular congestion noted. Mildly increased interstitial markings on the right side could reflect minimal interstitial edema or pneumonia.   Electronically Signed   By: Roanna Raider M.D.   On: 04/16/2014 21:57     EKG Interpretation   Date/Time:  Saturday April 16 2014 19:07:50 EDT Ventricular Rate:  60 PR Interval:    QRS Duration: 150 QT Interval:  508 QTC Calculation: 508 R Axis:   -81 Text Interpretation:  Ventricular-paced rhythm Abnormal ECG Confirmed by  Mirian Mo [phone]) on 04/16/2014 8:50:52 PM      MDM   Final diagnoses:  Dyspnea  Cough    80 y.o. male  with pertinent PMH of CAD, AVB sp pacemaker placement presents with generalized malaise, weakness x 1 day in setting of recent PNA.  Pt was sent home on QOD levaquin.  No fevers, and pt has had a constant cough since his admission.  No chest pain.  Physical exam and vitals on arrival as above. .    Labs and imaging as above reviewed. No PNA on XR.  Likely fluid overload.   Family informed, and pt is to take double lasix dose for 2 days.  Standard return precautions given.  1. Dyspnea   2. Cough         Mirian Mo, MD 04/17/14 1224

## 2014-04-21 DIAGNOSIS — J189 Pneumonia, unspecified organism: Secondary | ICD-10-CM | POA: Diagnosis not present

## 2014-04-21 DIAGNOSIS — Z23 Encounter for immunization: Secondary | ICD-10-CM | POA: Diagnosis not present

## 2014-04-26 ENCOUNTER — Other Ambulatory Visit: Payer: Self-pay | Admitting: *Deleted

## 2014-04-26 MED ORDER — ATORVASTATIN CALCIUM 10 MG PO TABS: 10.0000 mg | ORAL_TABLET | Freq: Every day | ORAL | Status: DC

## 2014-04-26 NOTE — Telephone Encounter (Signed)
Patient walked in - yellow sheet - requesting refill of atorvastatin.   Called # provided and spoke with daughter. #[phone] supply sent to [facility] in [place] per patient request.

## 2014-05-16 ENCOUNTER — Other Ambulatory Visit: Payer: Self-pay | Admitting: *Deleted

## 2014-05-16 MED ORDER — ISOSORBIDE MONONITRATE ER 60 MG PO TB24: 60.0000 mg | ORAL_TABLET | Freq: Every day | ORAL | Status: DC

## 2014-05-17 NOTE — Telephone Encounter (Signed)
error 

## 2014-05-23 ENCOUNTER — Other Ambulatory Visit: Payer: Self-pay

## 2014-05-23 ENCOUNTER — Telehealth: Payer: Self-pay | Admitting: Cardiology

## 2014-05-23 MED ORDER — LOSARTAN POTASSIUM 50 MG PO TABS
50.0000 mg | ORAL_TABLET | Freq: Every day | ORAL | Status: DC
Start: 2014-05-23 — End: 2015-09-01

## 2014-05-23 NOTE — Telephone Encounter (Signed)
Pt is completely out of his Furosemide. Would you please call it in today asap [phone].

## 2014-05-24 ENCOUNTER — Other Ambulatory Visit: Payer: Self-pay

## 2014-05-24 MED ORDER — FUROSEMIDE 20 MG PO TABS: 40.0000 mg | ORAL_TABLET | Freq: Every day | ORAL | Status: DC

## 2014-05-25 ENCOUNTER — Other Ambulatory Visit: Payer: Self-pay

## 2014-05-25 MED ORDER — FUROSEMIDE 20 MG PO TABS: 40.0000 mg | ORAL_TABLET | Freq: Every day | ORAL | Status: DC

## 2014-06-11 ENCOUNTER — Emergency Department (HOSPITAL_COMMUNITY)
Admission: EM | Admit: 2014-06-11 | Discharge: 2014-06-11 | Disposition: A | Discharge status: Home / Self Care | Payer: Medicare Other | Attending: Emergency Medicine | Admitting: Emergency Medicine

## 2014-06-11 ENCOUNTER — Emergency Department (HOSPITAL_COMMUNITY): Payer: Medicare Other

## 2014-06-11 ENCOUNTER — Encounter (HOSPITAL_COMMUNITY): Payer: Self-pay | Admitting: Emergency Medicine

## 2014-06-11 DIAGNOSIS — S098XXA Other specified injuries of head, initial encounter: Secondary | ICD-10-CM | POA: Diagnosis not present

## 2014-06-11 DIAGNOSIS — S0990XA Unspecified injury of head, initial encounter: Secondary | ICD-10-CM | POA: Diagnosis not present

## 2014-06-11 DIAGNOSIS — W01190A Fall on same level from slipping, tripping and stumbling with subsequent striking against furniture, initial encounter: Secondary | ICD-10-CM | POA: Insufficient documentation

## 2014-06-11 DIAGNOSIS — Y998 Other external cause status: Secondary | ICD-10-CM | POA: Diagnosis not present

## 2014-06-11 DIAGNOSIS — S161XXA Strain of muscle, fascia and tendon at neck level, initial encounter: Secondary | ICD-10-CM | POA: Diagnosis not present

## 2014-06-11 DIAGNOSIS — E785 Hyperlipidemia, unspecified: Secondary | ICD-10-CM | POA: Insufficient documentation

## 2014-06-11 DIAGNOSIS — I10 Essential (primary) hypertension: Secondary | ICD-10-CM | POA: Insufficient documentation

## 2014-06-11 DIAGNOSIS — Y9389 Activity, other specified: Secondary | ICD-10-CM | POA: Diagnosis not present

## 2014-06-11 DIAGNOSIS — I2510 Atherosclerotic heart disease of native coronary artery without angina pectoris: Secondary | ICD-10-CM | POA: Insufficient documentation

## 2014-06-11 DIAGNOSIS — J45909 Unspecified asthma, uncomplicated: Secondary | ICD-10-CM | POA: Diagnosis not present

## 2014-06-11 DIAGNOSIS — I7300 Raynaud's syndrome without gangrene: Secondary | ICD-10-CM | POA: Diagnosis not present

## 2014-06-11 DIAGNOSIS — Z79899 Other long term (current) drug therapy: Secondary | ICD-10-CM | POA: Insufficient documentation

## 2014-06-11 DIAGNOSIS — Z9889 Other specified postprocedural states: Secondary | ICD-10-CM | POA: Diagnosis not present

## 2014-06-11 DIAGNOSIS — R410 Disorientation, unspecified: Secondary | ICD-10-CM | POA: Diagnosis not present

## 2014-06-11 DIAGNOSIS — M109 Gout, unspecified: Secondary | ICD-10-CM | POA: Diagnosis not present

## 2014-06-11 DIAGNOSIS — Y92009 Unspecified place in unspecified non-institutional (private) residence as the place of occurrence of the external cause: Secondary | ICD-10-CM | POA: Insufficient documentation

## 2014-06-11 DIAGNOSIS — Z7901 Long term (current) use of anticoagulants: Secondary | ICD-10-CM | POA: Diagnosis not present

## 2014-06-11 DIAGNOSIS — R262 Difficulty in walking, not elsewhere classified: Secondary | ICD-10-CM | POA: Diagnosis not present

## 2014-06-11 DIAGNOSIS — I509 Heart failure, unspecified: Secondary | ICD-10-CM | POA: Insufficient documentation

## 2014-06-11 DIAGNOSIS — S199XXA Unspecified injury of neck, initial encounter: Secondary | ICD-10-CM | POA: Diagnosis not present

## 2014-06-11 DIAGNOSIS — R42 Dizziness and giddiness: Secondary | ICD-10-CM | POA: Diagnosis not present

## 2014-06-11 DIAGNOSIS — Z87891 Personal history of nicotine dependence: Secondary | ICD-10-CM | POA: Insufficient documentation

## 2014-06-11 DIAGNOSIS — R51 Headache: Secondary | ICD-10-CM | POA: Diagnosis not present

## 2014-06-11 LAB — CBC WITH DIFFERENTIAL/PLATELET
Basophils Absolute: 0 10*3/uL (ref 0.0–0.1)
Basophils Relative: 0 % (ref 0–1)
EOS ABS: 0.4 10*3/uL (ref 0.0–0.7)
EOS PCT: 5 % (ref 0–5)
HCT: 36.2 % — ABNORMAL LOW (ref 39.0–52.0)
Hemoglobin: 12.4 g/dL — ABNORMAL LOW (ref 13.0–17.0)
Lymphocytes Relative: 22 % (ref 12–46)
Lymphs Abs: 1.8 10*3/uL (ref 0.7–4.0)
MCH: 29.2 pg (ref 26.0–34.0)
MCHC: 34.3 g/dL (ref 30.0–36.0)
MCV: 85.4 fL (ref 78.0–100.0)
Monocytes Absolute: 0.9 10*3/uL (ref 0.1–1.0)
Monocytes Relative: 11 % (ref 3–12)
Neutro Abs: 5.2 10*3/uL (ref 1.7–7.7)
Neutrophils Relative %: 62 % (ref 43–77)
PLATELETS: 185 10*3/uL (ref 150–400)
RBC: 4.24 MIL/uL (ref 4.22–5.81)
RDW: 15.6 % — ABNORMAL HIGH (ref 11.5–15.5)
WBC: 8.2 10*3/uL (ref 4.0–10.5)

## 2014-06-11 LAB — COMPREHENSIVE METABOLIC PANEL
ALT: 25 U/L (ref 0–53)
AST: 23 U/L (ref 0–37)
Albumin: 3.6 g/dL (ref 3.5–5.2)
Alkaline Phosphatase: 94 U/L (ref 39–117)
Anion gap: 15 (ref 5–15)
BILIRUBIN TOTAL: 0.5 mg/dL (ref 0.3–1.2)
BUN: 23 mg/dL (ref 6–23)
CHLORIDE: 102 meq/L (ref 96–112)
CO2: 23 meq/L (ref 19–32)
CREATININE: 1.35 mg/dL (ref 0.50–1.35)
Calcium: 9.2 mg/dL (ref 8.4–10.5)
GFR calc non Af Amer: 44 mL/min — ABNORMAL LOW (ref >90)
GFR, EST AFRICAN AMERICAN: 50 mL/min — AB (ref >90)
GLUCOSE: 103 mg/dL — AB (ref 70–99)
Potassium: 4.2 mEq/L (ref 3.7–5.3)
Sodium: 140 mEq/L (ref 137–147)
Total Protein: 7.3 g/dL (ref 6.0–8.3)

## 2014-06-11 NOTE — ED Provider Notes (Signed)
CSN: [id number]     Arrival date & time 06/11/14  [id number]   First MD Initiated Contact with Patient 06/11/14 [phone]     Chief Complaint  Patient presents with  . Fall  . Head Injury  . Dizziness     (Consider location/radiation/quality/duration/timing/severity/associated sxs/prior Treatment) HPI Comments: Patient is a 80 year old male with history of coronary artery disease, hypertension, CHF, pacemaker placement. He is currently anticoagulated on Eliquis. He presents today for evaluation after a fall. He states that he walked into his house and became dizzy and lost his balance causing him to fall backward. He states he struck the back of his head on the tile floor. He denies any loss of consciousness, headache, or neck pain.  Patient is a 80 y.o. male presenting with fall, head injury, and dizziness. The history is provided by the patient.  Fall This is a new problem. The current episode started 1 to 2 hours ago. The problem occurs constantly. The problem has been gradually worsening. Pertinent negatives include no chest pain, no headaches and no shortness of breath. Nothing aggravates the symptoms. Nothing relieves the symptoms. He has tried nothing for the symptoms. The treatment provided no relief.  Head Injury Associated symptoms: no headaches   Dizziness Associated symptoms: no chest pain, no headaches and no shortness of breath     Past Medical History  Diagnosis Date  . Mitral insufficiency     chronically moderate to severe  . Hypertension   . Hyperlipidemia   . 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 Coronary artery disease     a. Remote lateral infarct;  b. 09/2011 MV: large dense inferolat/lat scar - ? very slight peri-infarcrt isch.  No signif change c/w 09/2010 scan.  . Raynaud's disease /phenomenon   . Hard of hearing     bilaterally  . Gout   . PTSD (post-traumatic stress disorder)     "being treated for it now but not a big degree"  . Asthma   .  Noncompliance with medication regimen   . Chronic systolic CHF (congestive heart failure)     a. 10/2011 Echo: EF 40-45%, inf AK, PASP 57mmHg.  Marland Kitchen. Persistent atrial fibrillation    Past Surgical History  Procedure Laterality Date  . Prostatectomy    . Tonsillectomy    . Inguinal hernia repair      left  . Appendectomy    . Cardiac catheterization  2003    SHOWED OCCLUSION OF THE LEFT CIRCUMFLEX CORONARY ARTERY  . Pacemaker insertion  03/28/14    STJ single chamber pacemaker implanted by Dr Graciela HusbandsKlein for symptomatic bradycardia   Family History  Problem Relation Age of Onset  . Heart attack Brother   . Alcohol abuse Father     died in his late 6550's  . Other Mother     died in her late 2580's - old age.   History  Substance Use Topics  . Smoking status: Former Smoker -- 1.50 packs/day for 20 years    Quit date: 12/12/1970  . Smokeless tobacco: Never Used  . Alcohol Use: No    Review of Systems  Respiratory: Negative for shortness of breath.   Cardiovascular: Negative for chest pain.  Neurological: Positive for dizziness. Negative for headaches.  All other systems reviewed and are negative.     Allergies  Zocor  Home Medications   Prior to Admission medications   Medication Sig Start Date End Date Taking? Authorizing  Provider  allopurinol (ZYLOPRIM) 300 MG tablet Take 450 mg by mouth daily.     Historical Provider, MD  amLODipine (NORVASC) 5 MG tablet Take 5 mg by mouth daily.    Historical Provider, MD  apixaban (ELIQUIS) 2.5 MG TABS tablet Take 2.5 mg by mouth 2 (two) times daily.    Historical Provider, MD  atorvastatin (LIPITOR) 10 MG tablet Take 1 tablet (10 mg total) by mouth daily at 6 PM. 04/26/14   Peter M SwazilandJordan, MD  Cholecalciferol (VITAMIN D-3) 1000 UNITS CAPS Take 1,000 Units by mouth 2 (two) times daily.     Historical Provider, MD  Coenzyme Q10 (COQ10 PO) Take 1 capsule by mouth daily.     Historical Provider, MD  finasteride (PROSCAR) 5 MG tablet Take 5 mg by  mouth daily.     Historical Provider, MD  furosemide (LASIX) 20 MG tablet Take 2 tablets (40 mg total) by mouth daily. 05/25/14   Peter M SwazilandJordan, MD  isosorbide mononitrate (IMDUR) 60 MG 24 hr tablet Take 1 tablet (60 mg total) by mouth daily. 05/16/14   Peter M SwazilandJordan, MD  levofloxacin (LEVAQUIN) 750 MG tablet Take 1 tablet (750 mg total) by mouth every other day. 04/11/14   Jerald KiefStephen K Chiu, MD  levothyroxine (SYNTHROID, LEVOTHROID) 50 MCG tablet Take 50 mcg by mouth daily before breakfast.    Historical Provider, MD  losartan (COZAAR) 50 MG tablet Take 1 tablet (50 mg total) by mouth daily. 05/23/14   Peter M SwazilandJordan, MD  nitroGLYCERIN (NITROSTAT) 0.4 MG SL tablet Place 1 tablet (0.4 mg total) under the tongue every 5 (five) minutes as needed. x3 doses as needed for chest pain 10/18/12   Ok Anishristopher R Berge, NP   BP 136/79 mmHg  Pulse 62  Temp(Src) 97.9 F (36.6 C) (Oral)  Resp 18  Ht 5' 10.5" (1.791 m)  Wt 180 lb (81.647 kg)  BMI 25.45 kg/m2  SpO2 99% Physical Exam  Constitutional: He appears well-developed and well-nourished. No distress.  HENT:  Head: Normocephalic.  There is a contusion to the occiput, however no abrasion or laceration.  Eyes: EOM are normal. Pupils are equal, round, and reactive to light.  Neck: Normal range of motion. Neck supple.  There is no cervical spine tenderness, or step-off  Skin: He is not diaphoretic.  Nursing note and vitals reviewed.   ED Course  Procedures (including critical care time) Labs Review Labs Reviewed  COMPREHENSIVE METABOLIC PANEL  CBC WITH DIFFERENTIAL    Imaging Review No results found.   EKG Interpretation   Date/Time:  Saturday June 11 2014 09:46:36 EST Ventricular Rate:  60 PR Interval:  150 QRS Duration: 144 QT Interval:  498 QTC Calculation: 498 R Axis:   -74 Text Interpretation:  Atrial fibrillation Paired ventricular premature  complexes Nonspecific IVCD with LAD LVH with secondary repolarization   abnormality Probable inferior infarct, acute Anterior infarct, old  Confirmed by DELOS  MD, Joleigh Mineau ([id number]) on 06/11/2014 10:47:11 AM      MDM   Final diagnoses:  None    Patient is a 80 year old male with history of pacemaker and cardiac arrhythmia who is currently taking Eliquis. He presents today after a dizzy episode that occurred at home and resulted in a fall. He apparently struck the back of his head on a tile floor. He was disoriented for several seconds after the fall, however has been awake, alert, and appropriate since that time. CT scan of the head and cervical spine are unremarkable  and he is neurologically intact. His laboratory studies reveal no abnormalities which would explain his fall. His EKG reveals underlying atrial flutter which appears old when compared to priors.   The patient feels well and is adamant about going home, however his daughter who is at bedside is adamant about him being admitted. An argument between the 2 ensued and the patient is refusing admission. The risks and benefits of admission versus discharge were explained to the patient who understands and accepts these risks. He will be discharged to home at his request.    Geoffery Lyons, MD 06/11/14 1244

## 2014-06-11 NOTE — Discharge Instructions (Signed)
Return to the emergency department for severe headache, vomiting, seizure activity, or any other new and concerning symptoms.   Concussion A concussion, or closed-head injury, is a brain injury caused by a direct blow to the head or by a quick and sudden movement (jolt) of the head or neck. Concussions are usually not life-threatening. Even so, the effects of a concussion can be serious. If you have had a concussion before, you are more likely to experience concussion-like symptoms after a direct blow to the head.  CAUSES  Direct blow to the head, such as from running into another player during a soccer game, being hit in a fight, or hitting your head on a hard surface.  A jolt of the head or neck that causes the brain to move back and forth inside the skull, such as in a car crash. SIGNS AND SYMPTOMS The signs of a concussion can be hard to notice. Early on, they may be missed by you, family members, and health care providers. You may look fine but act or feel differently. Symptoms are usually temporary, but they may last for days, weeks, or even longer. Some symptoms may appear right away while others may not show up for hours or days. Every head injury is different. Symptoms include:  Mild to moderate headaches that will not go away.  A feeling of pressure inside your head.  Having more trouble than usual:  Learning or remembering things you have heard.  Answering questions.  Paying attention or concentrating.  Organizing daily tasks.  Making decisions and solving problems.  Slowness in thinking, acting or reacting, speaking, or reading.  Getting lost or being easily confused.  Feeling tired all the time or lacking energy (fatigued).  Feeling drowsy.  Sleep disturbances.  Sleeping more than usual.  Sleeping less than usual.  Trouble falling asleep.  Trouble sleeping (insomnia).  Loss of balance or feeling lightheaded or dizzy.  Nausea or vomiting.  Numbness or  tingling.  Increased sensitivity to:  Sounds.  Lights.  Distractions.  Vision problems or eyes that tire easily.  Diminished sense of taste or smell.  Ringing in the ears.  Mood changes such as feeling sad or anxious.  Becoming easily irritated or angry for little or no reason.  Lack of motivation.  Seeing or hearing things other people do not see or hear (hallucinations). DIAGNOSIS Your health care provider can usually diagnose a concussion based on a description of your injury and symptoms. He or she will ask whether you passed out (lost consciousness) and whether you are having trouble remembering events that happened right before and during your injury. Your evaluation might include:  A brain scan to look for signs of injury to the brain. Even if the test shows no injury, you may still have a concussion.  Blood tests to be sure other problems are not present. TREATMENT  Concussions are usually treated in an emergency department, in urgent care, or at a clinic. You may need to stay in the hospital overnight for further treatment.  Tell your health care provider if you are taking any medicines, including prescription medicines, over-the-counter medicines, and natural remedies. Some medicines, such as blood thinners (anticoagulants) and aspirin, may increase the chance of complications. Also tell your health care provider whether you have had alcohol or are taking illegal drugs. This information may affect treatment.  Your health care provider will send you home with important instructions to follow.  How fast you will recover from a concussion  depends on many factors. These factors include how severe your concussion is, what part of your brain was injured, your age, and how healthy you were before the concussion.  Most people with mild injuries recover fully. Recovery can take time. In general, recovery is slower in older persons. Also, persons who have had a concussion in  the past or have other medical problems may find that it takes longer to recover from their current injury. HOME CARE INSTRUCTIONS General Instructions  Carefully follow the directions your health care provider gave you.  Only take over-the-counter or prescription medicines for pain, discomfort, or fever as directed by your health care provider.  Take only those medicines that your health care provider has approved.  Do not drink alcohol until your health care provider says you are well enough to do so. Alcohol and certain other drugs may slow your recovery and can put you at risk of further injury.  If it is harder than usual to remember things, write them down.  If you are easily distracted, try to do one thing at a time. For example, do not try to watch TV while fixing dinner.  Talk with family members or close friends when making important decisions.  Keep all follow-up appointments. Repeated evaluation of your symptoms is recommended for your recovery.  Watch your symptoms and tell others to do the same. Complications sometimes occur after a concussion. Older adults with a brain injury may have a higher risk of serious complications, such as a blood clot on the brain.  Tell your teachers, school nurse, school counselor, coach, athletic trainer, or work Freight forwarder about your injury, symptoms, and restrictions. Tell them about what you can or cannot do. They should watch for:  Increased problems with attention or concentration.  Increased difficulty remembering or learning new information.  Increased time needed to complete tasks or assignments.  Increased irritability or decreased ability to cope with stress.  Increased symptoms.  Rest. Rest helps the brain to heal. Make sure you:  Get plenty of sleep at night. Avoid staying up late at night.  Keep the same bedtime hours on weekends and weekdays.  Rest during the day. Take daytime naps or rest breaks when you feel  tired.  Limit activities that require a lot of thought or concentration. These include:  Doing homework or job-related work.  Watching TV.  Working on the computer.  Avoid any situation where there is potential for another head injury (football, hockey, soccer, basketball, martial arts, downhill snow sports and horseback riding). Your condition will get worse every time you experience a concussion. You should avoid these activities until you are evaluated by the appropriate follow-up health care providers. Returning To Your Regular Activities You will need to return to your normal activities slowly, not all at once. You must give your body and brain enough time for recovery.  Do not return to sports or other athletic activities until your health care provider tells you it is safe to do so.  Ask your health care provider when you can drive, ride a bicycle, or operate heavy machinery. Your ability to react may be slower after a brain injury. Never do these activities if you are dizzy.  Ask your health care provider about when you can return to work or school. Preventing Another Concussion It is very important to avoid another brain injury, especially before you have recovered. In rare cases, another injury can lead to permanent brain damage, brain swelling, or death. The risk  of this is greatest during the first 7-10 days after a head injury. Avoid injuries by:  Wearing a seat belt when riding in a car.  Drinking alcohol only in moderation.  Wearing a helmet when biking, skiing, skateboarding, skating, or doing similar activities.  Avoiding activities that could lead to a second concussion, such as contact or recreational sports, until your health care provider says it is okay.  Taking safety measures in your home.  Remove clutter and tripping hazards from floors and stairways.  Use grab bars in bathrooms and handrails by stairs.  Place non-slip mats on floors and in  bathtubs.  Improve lighting in dim areas. SEEK MEDICAL CARE IF:  You have increased problems paying attention or concentrating.  You have increased difficulty remembering or learning new information.  You need more time to complete tasks or assignments than before.  You have increased irritability or decreased ability to cope with stress.  You have more symptoms than before. Seek medical care if you have any of the following symptoms for more than 2 weeks after your injury:  Lasting (chronic) headaches.  Dizziness or balance problems.  Nausea.  Vision problems.  Increased sensitivity to noise or light.  Depression or mood swings.  Anxiety or irritability.  Memory problems.  Difficulty concentrating or paying attention.  Sleep problems.  Feeling tired all the time. SEEK IMMEDIATE MEDICAL CARE IF:  You have severe or worsening headaches. These may be a sign of a blood clot in the brain.  You have weakness (even if only in one hand, leg, or part of the face).  You have numbness.  You have decreased coordination.  You vomit repeatedly.  You have increased sleepiness.  One pupil is larger than the other.  You have convulsions.  You have slurred speech.  You have increased confusion. This may be a sign of a blood clot in the brain.  You have increased restlessness, agitation, or irritability.  You are unable to recognize people or places.  You have neck pain.  It is difficult to wake you up.  You have unusual behavior changes.  You lose consciousness. MAKE SURE YOU:  Understand these instructions.  Will watch your condition.  Will get help right away if you are not doing well or get worse. Document Released: 10/12/2003 Document Revised: 07/27/2013 Document Reviewed: 02/11/2013 [facility] Patient Information 2015 [place], [place]. This information is not intended to replace advice given to you by your health care provider. Make sure you discuss any  questions you have with your health care provider.

## 2014-06-11 NOTE — ED Notes (Signed)
Patient transported to X-ray 

## 2014-06-11 NOTE — ED Notes (Signed)
Received pt from home via EMS with c/o after going outside to pray, pt came into house and felt dizziness. Pt fell and hit his head on the floor in the house. No LOC. Pt c/o headache. Pt is on blood thinners. Pt ambulatory on scene.

## 2014-06-21 DIAGNOSIS — M109 Gout, unspecified: Secondary | ICD-10-CM | POA: Diagnosis not present

## 2014-06-21 DIAGNOSIS — I441 Atrioventricular block, second degree: Secondary | ICD-10-CM | POA: Diagnosis not present

## 2014-06-21 DIAGNOSIS — E039 Hypothyroidism, unspecified: Secondary | ICD-10-CM | POA: Diagnosis not present

## 2014-06-21 DIAGNOSIS — I110 Hypertensive heart disease with heart failure: Secondary | ICD-10-CM | POA: Diagnosis not present

## 2014-06-21 DIAGNOSIS — N400 Enlarged prostate without lower urinary tract symptoms: Secondary | ICD-10-CM | POA: Diagnosis not present

## 2014-06-21 DIAGNOSIS — K219 Gastro-esophageal reflux disease without esophagitis: Secondary | ICD-10-CM | POA: Diagnosis not present

## 2014-06-21 DIAGNOSIS — I5022 Chronic systolic (congestive) heart failure: Secondary | ICD-10-CM | POA: Diagnosis not present

## 2014-06-21 DIAGNOSIS — E782 Mixed hyperlipidemia: Secondary | ICD-10-CM | POA: Diagnosis not present

## 2014-07-12 ENCOUNTER — Encounter: Payer: Self-pay | Admitting: Internal Medicine

## 2014-07-12 ENCOUNTER — Ambulatory Visit (INDEPENDENT_AMBULATORY_CARE_PROVIDER_SITE_OTHER): Payer: Medicare Other | Admitting: Internal Medicine

## 2014-07-12 VITALS — BP 124/66 | HR 66 | Ht 70.0 in | Wt 183.0 lb

## 2014-07-12 DIAGNOSIS — I2510 Atherosclerotic heart disease of native coronary artery without angina pectoris: Secondary | ICD-10-CM

## 2014-07-12 DIAGNOSIS — Z45018 Encounter for adjustment and management of other part of cardiac pacemaker: Secondary | ICD-10-CM | POA: Diagnosis not present

## 2014-07-12 DIAGNOSIS — R001 Bradycardia, unspecified: Secondary | ICD-10-CM | POA: Diagnosis not present

## 2014-07-12 DIAGNOSIS — I481 Persistent atrial fibrillation: Secondary | ICD-10-CM

## 2014-07-12 DIAGNOSIS — I340 Nonrheumatic mitral (valve) insufficiency: Secondary | ICD-10-CM

## 2014-07-12 DIAGNOSIS — I4819 Other persistent atrial fibrillation: Secondary | ICD-10-CM

## 2014-07-12 LAB — MDC_IDC_ENUM_SESS_TYPE_INCLINIC
Battery Remaining Longevity: 134.4 mo
Battery Voltage: 3.05 V
Brady Statistic RV Percent Paced: 57 %
Date Time Interrogation Session: 20151208190701
Implantable Pulse Generator Model: 1240
Implantable Pulse Generator Serial Number: 7616716
Lead Channel Sensing Intrinsic Amplitude: 9.1 mV
Lead Channel Setting Pacing Pulse Width: 0.4 ms
Lead Channel Setting Sensing Sensitivity: 2 mV
MDC IDC MSMT LEADCHNL RV IMPEDANCE VALUE: 687.5 Ohm
MDC IDC MSMT LEADCHNL RV PACING THRESHOLD AMPLITUDE: 0.375 V
MDC IDC MSMT LEADCHNL RV PACING THRESHOLD PULSEWIDTH: 0.4 ms
MDC IDC SET LEADCHNL RV PACING AMPLITUDE: 0.625

## 2014-07-12 NOTE — Progress Notes (Signed)
Patient Care Team: Elias Elseobert Reade, MD as PCP - General (Family Medicine)   HPI  Craig [name] is a 80 y.o. male Seen in follow-up for pacemaker implanted 8/15 for symptomatic bradycardia.  He has persistent atrial fibrillation. Reviewing electrocardiograms 12/13 was the last time he was in sinus rhythm. He has complaints of exercise intolerance.  This is some improved following his pacemaker.  Echocardiogram 3/14 demonstrated moderate LV dysfunction 40-45% with moderate-severe MR and severe LAE.  Review of ECGs demonstrates episodes of atrial fibrillation/flutter      Past Medical History  Diagnosis Date  . Mitral insufficiency     chronically moderate to severe  . Hypertension   . Hyperlipidemia   . 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 Coronary artery disease     a. Remote lateral infarct;  b. 09/2011 MV: large dense inferolat/lat scar - ? very slight peri-infarcrt isch.  No signif change c/w 09/2010 scan.  . Raynaud's disease /phenomenon   . Hard of hearing     bilaterally  . Gout   . PTSD (post-traumatic stress disorder)     "being treated for it now but not a big degree"  . Asthma   . Noncompliance with medication regimen   . Chronic systolic CHF (congestive heart failure)     a. 10/2011 Echo: EF 40-45%, inf AK, PASP 57mmHg.  Marland Kitchen. Persistent atrial fibrillation     Past Surgical History  Procedure Laterality Date  . Prostatectomy    . Tonsillectomy    . Inguinal hernia repair      left  . Appendectomy    . Cardiac catheterization  2003    SHOWED OCCLUSION OF THE LEFT CIRCUMFLEX CORONARY ARTERY  . Pacemaker insertion  03/28/14    STJ single chamber pacemaker implanted by Dr Graciela HusbandsKlein for symptomatic bradycardia    Current Outpatient Prescriptions  Medication Sig Dispense Refill  . allopurinol (ZYLOPRIM) 300 MG tablet Take 300 mg by mouth daily.     Marland Kitchen. amLODipine (NORVASC) 5 MG tablet Take 5 mg by mouth daily.    Marland Kitchen. apixaban (ELIQUIS) 2.5 MG TABS  tablet Take 2.5 mg by mouth 2 (two) times daily.    Marland Kitchen. atorvastatin (LIPITOR) 10 MG tablet Take 1 tablet (10 mg total) by mouth daily at 6 PM. 90 tablet 3  . Cholecalciferol (VITAMIN D-3) 1000 UNITS CAPS Take 1,000 Units by mouth 2 (two) times daily.     . Coenzyme Q10 (COQ10 PO) Take 1 capsule by mouth daily.     . finasteride (PROSCAR) 5 MG tablet Take 5 mg by mouth every evening.     . furosemide (LASIX) 20 MG tablet Take 2 tablets (40 mg total) by mouth daily. 60 tablet 6  . isosorbide mononitrate (IMDUR) 60 MG 24 hr tablet Take 1 tablet (60 mg total) by mouth daily. 30 tablet 11  . levothyroxine (SYNTHROID, LEVOTHROID) 50 MCG tablet Take 50 mcg by mouth daily before breakfast.    . losartan (COZAAR) 50 MG tablet Take 1 tablet (50 mg total) by mouth daily. 30 tablet 6  . nitroGLYCERIN (NITROSTAT) 0.4 MG SL tablet Place 1 tablet (0.4 mg total) under the tongue every 5 (five) minutes as needed. x3 doses as needed for chest pain 25 tablet 3   No current facility-administered medications for this visit.    Allergies  Allergen Reactions  . Zocor [Simvastatin] Other (See Comments)    Unknown reaction per pt  Review of Systems negative except from HPI and PMH  Physical Exam BP 124/66 mmHg  Pulse 66  Ht 5\' 10"  (1.778 m)  Wt 183 lb (83.008 kg)  BMI 26.26 kg/m2 Well developed and well nourished in no acute distress HENT normal E scleral and icterus clear Neck Supple JVP flat; carotids brisk and full Clear to ausculation Device pocket well healed; without hematoma or erythema.  There is no tethering Regular rate and rhythm, no murmurs gallops or rub Soft with active bowel sounds No clubbing cyanosis  Edema Alert and oriented, grossly normal motor and sensory function Skin Warm and Dry   ECG demonstrates sinus rhythm at 65 Intervals 22/08/44 with some PACs with poor conduction and ventricular escape beats   Assessment and  Pl  Sinus node dysfunctionn  First-degree AV block  and second-degree AV block  LV dysfunction/valvular cardiomyopathy  Mitral regurgitation-moderate-severe  The patient has progressive shortness of breath. I suspect he has been worsening mitral regurgitation and/or LV dysfunction. I've encouraged him to do when he wants to do.  There are some episodes of atrial flutter on ECG in the chart; currently he is in sinus rhythm with variable conduction. Pacemaker function is normal.  We will plan to have him get an echo and stress discussed with Dr. Garth BignessPJ as to whether he also can be done for him a MR point of view. He appears euvolemic son will not adjust his diuretics today.

## 2014-07-12 NOTE — Patient Instructions (Addendum)
Your physician recommends that you continue on your current medications as directed. Please refer to the Current Medication list given to you today.  Your physician has requested that you have an echocardiogram (before following up with Dr. SwazilandJordan).  Echocardiography is a painless test that uses sound waves to create images of your heart. It provides your doctor with information about the size and shape of your heart and how well your heart's chambers and valves are working. This procedure takes approximately one hour. There are no restrictions for this procedure.  Your physician recommends that you schedule a follow-up appointment in: 6 weeks with Dr. SwazilandJordan (after completing echo test)  Remote monitoring is used to monitor your Pacemaker of ICD from home. This monitoring reduces the number of office visits required to check your device to one time per year. It allows us to keep an eye on the functioning of your device to ensure it is working properly. You are scheduled for a device check from home on 10/11/14. You may send your transmission at any time that day. If you have a wireless device, the transmission will be sent automatically. After your physician reviews your transmission, you will receive a postcard with your next transmission date.  Your physician wants you to follow-up in: August 2016 with Dr. Graciela HusbandsKlein. You will receive a reminder letter in the mail two months in advance. If you don't receive a letter, please call our office to schedule the follow-up appointment.

## 2014-07-14 ENCOUNTER — Encounter (HOSPITAL_COMMUNITY): Payer: Self-pay | Admitting: Cardiovascular Disease

## 2014-07-15 ENCOUNTER — Ambulatory Visit (HOSPITAL_COMMUNITY): Payer: Medicare Other | Attending: Internal Medicine

## 2014-07-15 DIAGNOSIS — I059 Rheumatic mitral valve disease, unspecified: Secondary | ICD-10-CM | POA: Insufficient documentation

## 2014-07-15 DIAGNOSIS — I340 Nonrheumatic mitral (valve) insufficiency: Secondary | ICD-10-CM

## 2014-07-15 DIAGNOSIS — I4819 Other persistent atrial fibrillation: Secondary | ICD-10-CM

## 2014-07-15 NOTE — Progress Notes (Signed)
2D Echo completed. 07/15/2014 

## 2014-07-19 ENCOUNTER — Telehealth: Payer: Self-pay | Admitting: Cardiology

## 2014-07-19 ENCOUNTER — Encounter: Payer: Self-pay | Admitting: Internal Medicine

## 2014-07-19 NOTE — Telephone Encounter (Signed)
Returned call to patient's daughter Franchot ErichsenBernett she stated father had recent echo and Dr.Klein wanted him to see Dr.Jordan.Stated father was ok complains of weakness.Appointment scheduled with Dr.Jordan 08/15/14 at 4:00 pm.

## 2014-07-19 NOTE — Telephone Encounter (Signed)
New Message        Pt's daughter calling stating that they were told by Dr. Graciela HusbandsKlein at pt's last visit that the pt needed to schedule an appt w/ Dr. SwazilandJordan. Offered Dr. Elvis CoilJordan's soonest appt in March and she said that is too long to wait and wants something sooner. Please call back and advise.

## 2014-07-28 IMAGING — CR DG CHEST 2V
2 series · 2 of 2 positions shown · non-contrast
Comparison: 10/20/2011

CLINICAL DATA: Chest pain with shortness of breath.

CHEST - 2 VIEW

[w chest pa]
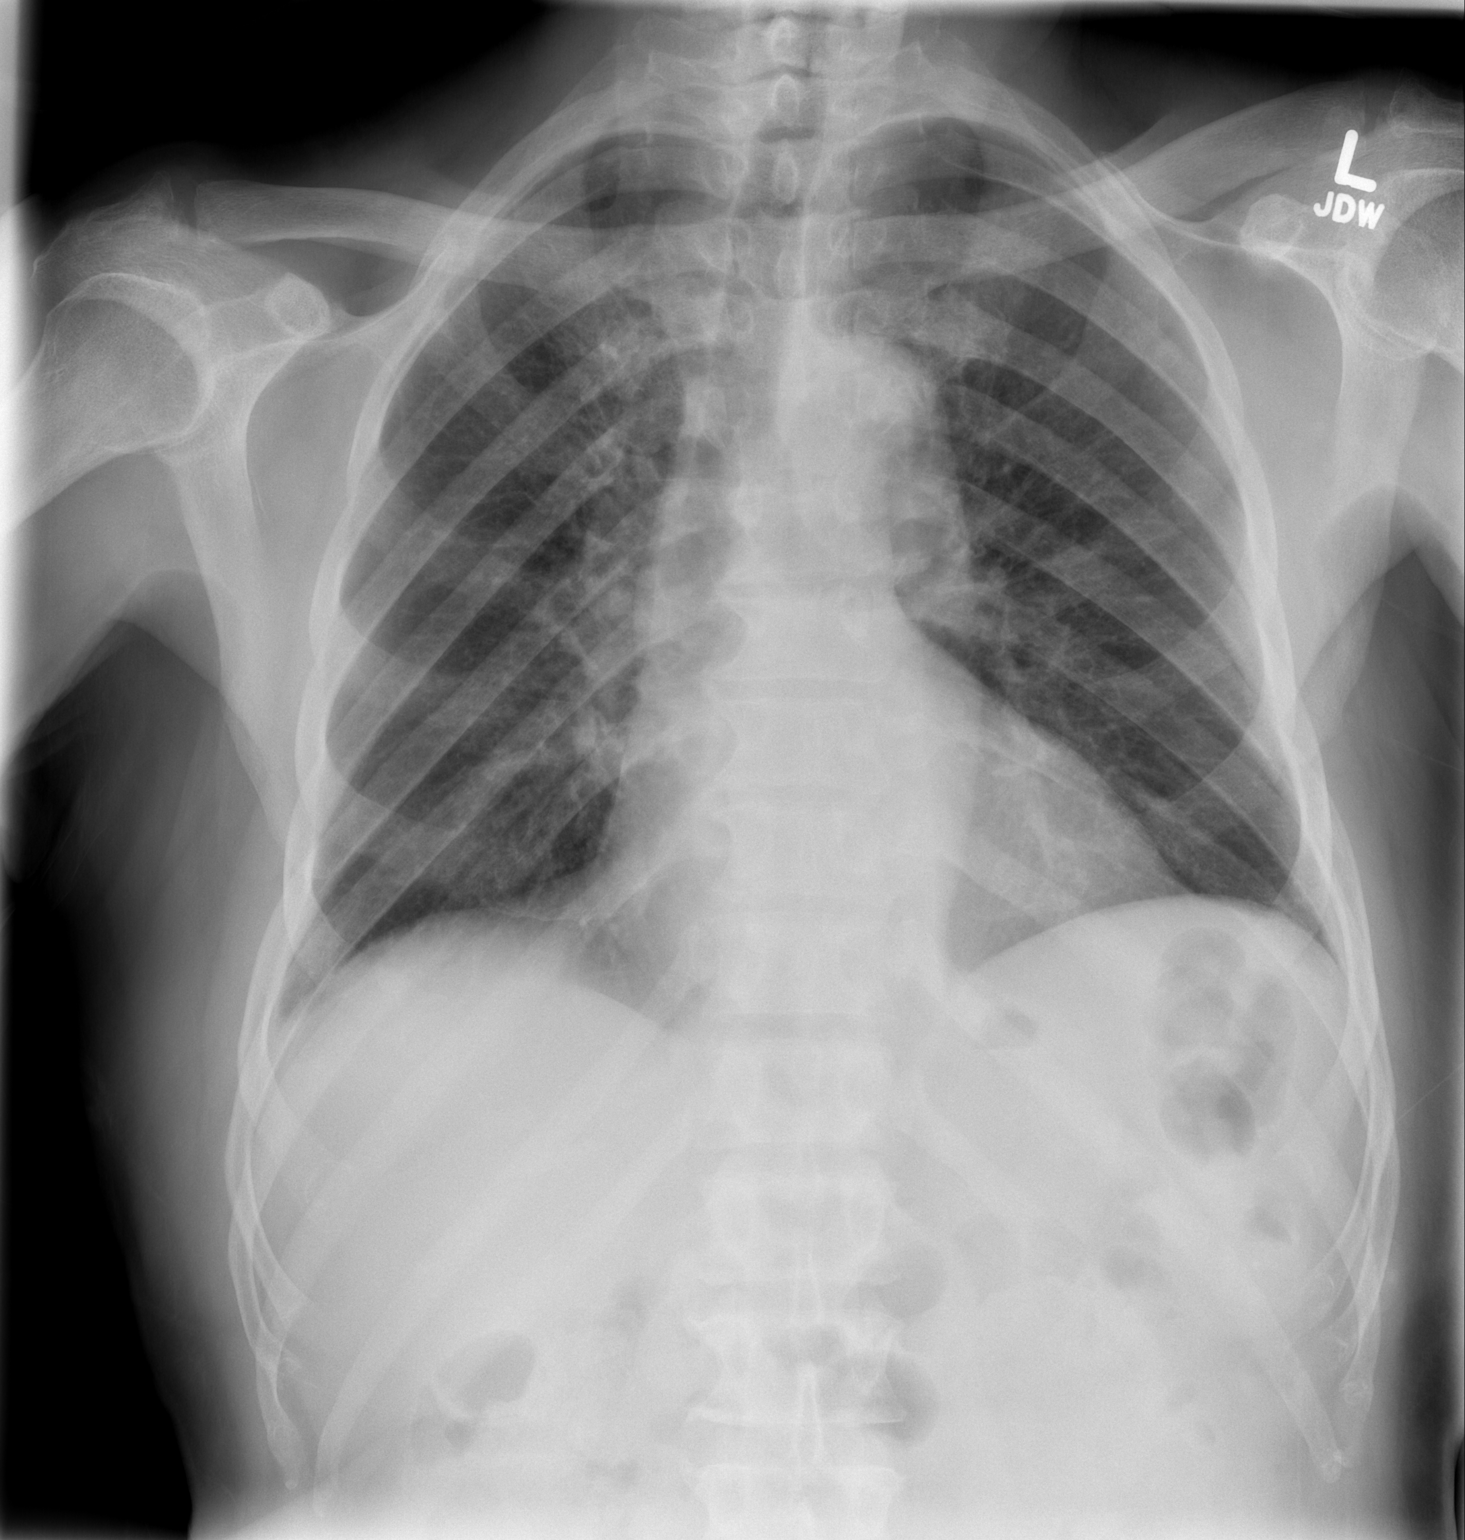

[w chest lat]
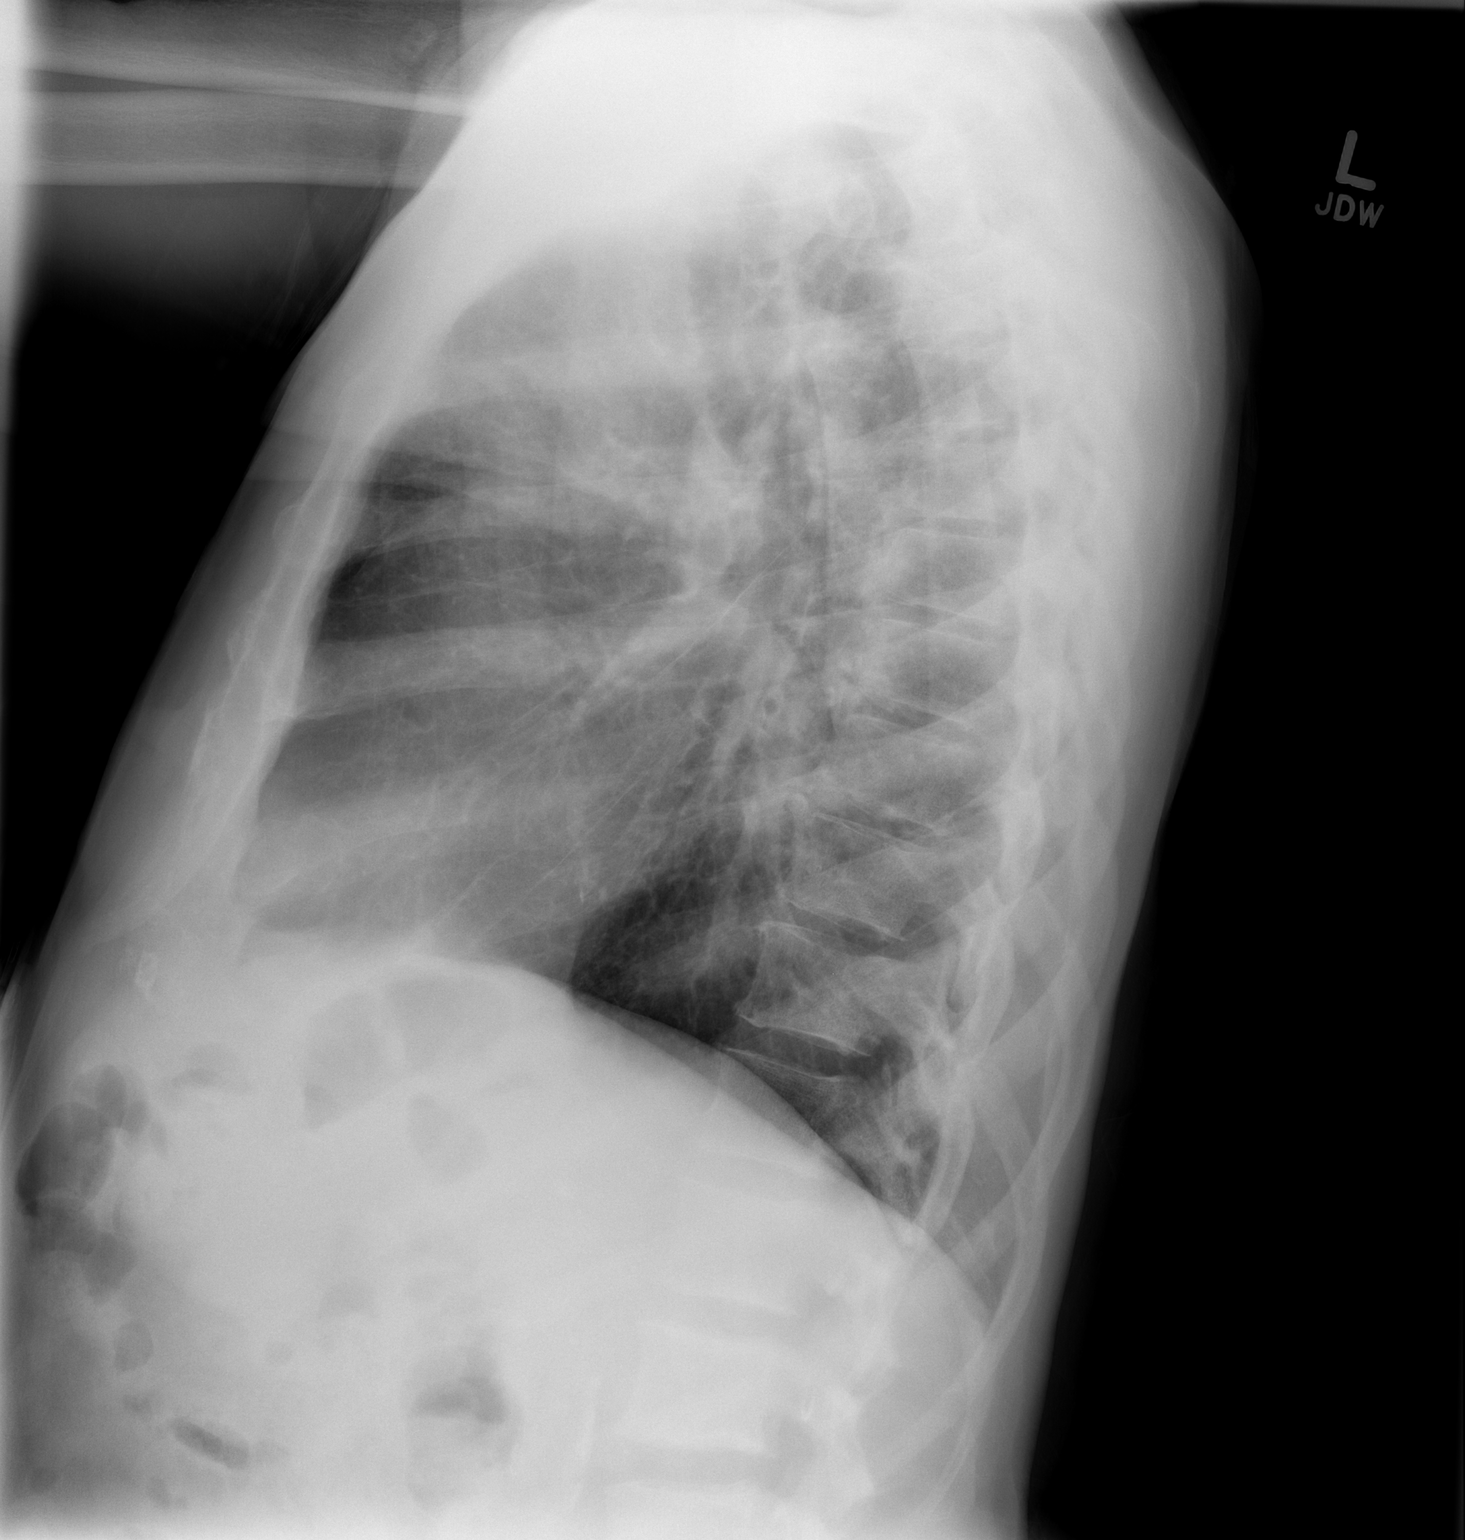

[2 of 2 positions shown; findings below may reference images not displayed]

FINDINGS: The lungs are clear without focal infiltrate, edema,
pneumothorax or pleural effusion.  Lung bases are better aerated
than on the previous study. Cardiopericardial silhouette is at
upper limits of normal for size. Interstitial markings are
diffusely coarsened with chronic features. Imaged bony structures
of the thorax are intact.
IMPRESSION: Underlying chronic interstitial coarsening.  No acute
cardiopulmonary findings.

## 2014-08-11 DIAGNOSIS — I10 Essential (primary) hypertension: Secondary | ICD-10-CM | POA: Diagnosis not present

## 2014-08-12 ENCOUNTER — Other Ambulatory Visit: Payer: Self-pay | Admitting: *Deleted

## 2014-08-12 MED ORDER — APIXABAN 2.5 MG PO TABS: 2.5000 mg | ORAL_TABLET | Freq: Two times a day (BID) | ORAL | Status: DC

## 2014-08-15 ENCOUNTER — Ambulatory Visit (INDEPENDENT_AMBULATORY_CARE_PROVIDER_SITE_OTHER): Payer: Medicare Other | Admitting: Cardiology

## 2014-08-15 ENCOUNTER — Encounter: Payer: Self-pay | Admitting: Cardiology

## 2014-08-15 VITALS — BP 123/65 | HR 73 | Ht 70.5 in | Wt 188.1 lb

## 2014-08-15 DIAGNOSIS — I10 Essential (primary) hypertension: Secondary | ICD-10-CM

## 2014-08-15 DIAGNOSIS — I5022 Chronic systolic (congestive) heart failure: Secondary | ICD-10-CM

## 2014-08-15 DIAGNOSIS — I441 Atrioventricular block, second degree: Secondary | ICD-10-CM | POA: Diagnosis not present

## 2014-08-15 DIAGNOSIS — I2510 Atherosclerotic heart disease of native coronary artery without angina pectoris: Secondary | ICD-10-CM

## 2014-08-15 NOTE — Progress Notes (Signed)
Craig [name] Date of Birth: Apr 01, 1919 Medical Record #[id number]  History of Present Illness: [name] is seen for followup CAD and CHF. He has a history of CAD with chronic occlusion of the LCx- last cardiac cath in 2003. He also has a history of CHF with EF of 45-50%- by Echo December 2015. He has moderate  MR managed medically. He has a history of atrial flutter and is on Eliquis. He has a history of Mobitz type 1 AV block when in NSR. Prior Holter monitor in 9/13 showed AV block without indication for pacemaker.  In May he wore an event monitor  showing atrial flutter with slow ventricular response as low as 40 bpm. He underwent pacemaker implant in August 2015.  On followup today he states he is doing well. He still complains of days where he is "blah" with no energy or strength. Other days he feels fine. No chest pain, dyspnea, or edema. Last pacemaker check in December was satisfactory.   Current Outpatient Prescriptions on File Prior to Visit  Medication Sig Dispense Refill  . allopurinol (ZYLOPRIM) 300 MG tablet Take 300 mg by mouth daily.     Marland Kitchen amLODipine (NORVASC) 5 MG tablet Take 5 mg by mouth daily.    Marland Kitchen apixaban (ELIQUIS) 2.5 MG TABS tablet Take 1 tablet (2.5 mg total) by mouth 2 (two) times daily. 60 tablet 5  . atorvastatin (LIPITOR) 10 MG tablet Take 1 tablet (10 mg total) by mouth daily at 6 PM. 90 tablet 3  . Cholecalciferol (VITAMIN D-3) 1000 UNITS CAPS Take 1,000 Units by mouth 2 (two) times daily.     . Coenzyme Q10 (COQ10 PO) Take 1 capsule by mouth daily.     . finasteride (PROSCAR) 5 MG tablet Take 5 mg by mouth every evening.     . furosemide (LASIX) 20 MG tablet Take 2 tablets (40 mg total) by mouth daily. 60 tablet 6  . isosorbide mononitrate (IMDUR) 60 MG 24 hr tablet Take 1 tablet (60 mg total) by mouth daily. 30 tablet 11  . levothyroxine (SYNTHROID, LEVOTHROID) 50 MCG tablet Take 50 mcg by mouth daily before breakfast.    . losartan (COZAAR) 50 MG tablet  Take 1 tablet (50 mg total) by mouth daily. 30 tablet 6  . nitroGLYCERIN (NITROSTAT) 0.4 MG SL tablet Place 1 tablet (0.4 mg total) under the tongue every 5 (five) minutes as needed. x3 doses as needed for chest pain 25 tablet 3   No current facility-administered medications on file prior to visit.    Allergies  Allergen Reactions  . Zocor [Simvastatin] Other (See Comments)    Unknown reaction per pt    Past Medical History  Diagnosis Date  . Mitral insufficiency     chronically moderate to severe  . Hypertension   . Hyperlipidemia   . 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 Coronary artery disease     a. Remote lateral infarct;  b. 09/2011 MV: large dense inferolat/lat scar - ? very slight peri-infarcrt isch.  No signif change c/w 09/2010 scan.  . Raynaud's disease /phenomenon   . Hard of hearing     bilaterally  . Gout   . PTSD (post-traumatic stress disorder)     "being treated for it now but not a big degree"  . Asthma   . Noncompliance with medication regimen   . Chronic systolic CHF (congestive heart failure)     a. 10/2011 Echo: EF  40-45%, inf AK, PASP 57mmHg.  Marland Kitchen. Persistent atrial fibrillation     Past Surgical History  Procedure Laterality Date  . Prostatectomy    . Tonsillectomy    . Inguinal hernia repair      left  . Appendectomy    . Cardiac catheterization  2003    SHOWED OCCLUSION OF THE LEFT CIRCUMFLEX CORONARY ARTERY  . Pacemaker insertion  03/28/14    STJ single chamber pacemaker implanted by Dr Graciela HusbandsKlein for symptomatic bradycardia  . Left heart catheterization with coronary angiogram N/A 08/29/2011    Procedure: LEFT HEART CATHETERIZATION WITH CORONARY ANGIOGRAM;  Surgeon: Kathleene Hazelhristopher D McAlhany, MD;  [facility];  Service: Cardiovascular;  Laterality: N/A;  . Permanent pacemaker insertion N/A 03/28/2014    Procedure: PERMANENT PACEMAKER INSERTION;  Surgeon: Duke SalviaSteven C Klein, MD;  [facility];  Service: Cardiovascular;  Laterality: N/A;     History  Smoking status  . Former Smoker -- 1.50 packs/day for 20 years  . Quit date: 12/12/1970  Smokeless tobacco  . Never Used    History  Alcohol Use No    Family History  Problem Relation Age of Onset  . Heart attack Brother   . Alcohol abuse Father     died in his late 2350's  . Other Mother     died in her late 3980's - old age.    Review of Systems: As noted in history of present illness  All other systems were reviewed and are negative.  Physical Exam: BP 123/65 mmHg  Pulse 73  Ht 5' 10.5" (1.791 m)  Wt 188 lb 1.6 oz (85.322 kg)  BMI 26.60 kg/m2 He is an elderly white male, pleasant, in no distress. He is balding. He wears a hearing aid. Pupils are equal round and reactive. Sclera are clear. Oropharynx is clear. Neck is without JVD, adenopathy, thyromegaly, or bruits. Lungs are clear to auscultation and percussion. Cardiac exam reveals a regular rate and rhythm with a grade 2/6 systolic murmur at the apex. Abdomen is soft and nontender without organomegaly or masses. Extremities are without edema or cyanosis. Pulses are 2+. Skin is warm and dry. Neurologic exam reveals no focal abnormalities. Mood is appropriate.  LABORATORY DATA:   Lab Results  Component Value Date   WBC 8.2 06/11/2014   HGB 12.4* 06/11/2014   HCT 36.2* 06/11/2014   PLT 185 06/11/2014   GLUCOSE 103* 06/11/2014   CHOL 131 08/29/2011   TRIG 75 08/29/2011   HDL 46 08/29/2011   LDLCALC 70 08/29/2011   ALT 25 06/11/2014   AST 23 06/11/2014   NA 140 06/11/2014   K 4.2 06/11/2014   CL 102 06/11/2014   CREATININE 1.35 06/11/2014   BUN 23 06/11/2014   CO2 23 06/11/2014   TSH 3.540 11/12/2013   INR 1.08 08/29/2011   HGBA1C 6.5* 08/29/2011    Echo 07/15/14:Study Conclusions  - Left ventricle: Posterior lateral hypokinesis. The cavity size was mildly dilated. Systolic function was mildly reduced. The estimated ejection fraction was in the range of 45% to 50%. - Mitral valve:  Restricted posterior leaflet motion. There was moderate regurgitation. - Left atrium: The atrium was moderately dilated. - Right atrium: The atrium was mildly dilated. - Atrial septum: No defect or patent foramen ovale was identified. - Pulmonary arteries: PA peak pressure: 43 mm Hg (S).   Assessment / Plan:  1. Coronary disease with chronic occlusion of the left circumflex coronary. No significant anginal symptoms. Continue amlodipine and  isosorbide.  2. History of Mobitz type I second-degree AV block. Now s/p pacemaker.  3. Congestive heart failure chronic systolic, clinically well compensated. Ejection fraction of 45-50% and improved from one year before.  He is euvolemic.  4. Mitral insufficiency. Moderate by most recent Echo. This is of longstanding duration at least since 2001 and unchanged.  5. Hypertension.   6. Atrial flutter-rate with slow ventricular response. Pacemaker in place. On Eliquis for anti-coagulation.

## 2014-08-15 NOTE — Patient Instructions (Signed)
Continue your current therapy  I will see you in 6 months.   

## 2014-09-09 ENCOUNTER — Emergency Department (HOSPITAL_COMMUNITY)
Admission: EM | Admit: 2014-09-09 | Discharge: 2014-09-09 | Disposition: A | Discharge status: Home / Self Care | Payer: Medicare Other | Attending: Emergency Medicine | Admitting: Emergency Medicine

## 2014-09-09 ENCOUNTER — Encounter (HOSPITAL_COMMUNITY): Payer: Self-pay | Admitting: Family Medicine

## 2014-09-09 ENCOUNTER — Emergency Department (HOSPITAL_COMMUNITY): Payer: Medicare Other

## 2014-09-09 DIAGNOSIS — K5730 Diverticulosis of large intestine without perforation or abscess without bleeding: Secondary | ICD-10-CM | POA: Insufficient documentation

## 2014-09-09 DIAGNOSIS — Z9119 Patient's noncompliance with other medical treatment and regimen: Secondary | ICD-10-CM | POA: Diagnosis not present

## 2014-09-09 DIAGNOSIS — R001 Bradycardia, unspecified: Secondary | ICD-10-CM | POA: Diagnosis not present

## 2014-09-09 DIAGNOSIS — R1032 Left lower quadrant pain: Secondary | ICD-10-CM

## 2014-09-09 DIAGNOSIS — E785 Hyperlipidemia, unspecified: Secondary | ICD-10-CM | POA: Diagnosis not present

## 2014-09-09 DIAGNOSIS — I2510 Atherosclerotic heart disease of native coronary artery without angina pectoris: Secondary | ICD-10-CM | POA: Diagnosis not present

## 2014-09-09 DIAGNOSIS — Z79899 Other long term (current) drug therapy: Secondary | ICD-10-CM | POA: Insufficient documentation

## 2014-09-09 DIAGNOSIS — Z87891 Personal history of nicotine dependence: Secondary | ICD-10-CM | POA: Insufficient documentation

## 2014-09-09 DIAGNOSIS — J45909 Unspecified asthma, uncomplicated: Secondary | ICD-10-CM | POA: Diagnosis not present

## 2014-09-09 DIAGNOSIS — H9193 Unspecified hearing loss, bilateral: Secondary | ICD-10-CM | POA: Insufficient documentation

## 2014-09-09 DIAGNOSIS — I10 Essential (primary) hypertension: Secondary | ICD-10-CM | POA: Diagnosis not present

## 2014-09-09 DIAGNOSIS — I5022 Chronic systolic (congestive) heart failure: Secondary | ICD-10-CM | POA: Insufficient documentation

## 2014-09-09 DIAGNOSIS — N400 Enlarged prostate without lower urinary tract symptoms: Secondary | ICD-10-CM | POA: Diagnosis not present

## 2014-09-09 LAB — COMPREHENSIVE METABOLIC PANEL
ALT: 24 U/L (ref 0–53)
AST: 26 U/L (ref 0–37)
Albumin: 3.9 g/dL (ref 3.5–5.2)
Alkaline Phosphatase: 90 U/L (ref 39–117)
Anion gap: 8 (ref 5–15)
BILIRUBIN TOTAL: 0.7 mg/dL (ref 0.3–1.2)
BUN: 20 mg/dL (ref 6–23)
CHLORIDE: 103 mmol/L (ref 96–112)
CO2: 25 mmol/L (ref 19–32)
Calcium: 9.4 mg/dL (ref 8.4–10.5)
Creatinine, Ser: 1.5 mg/dL — ABNORMAL HIGH (ref 0.50–1.35)
GFR calc Af Amer: 44 mL/min — ABNORMAL LOW (ref >90)
GFR, EST NON AFRICAN AMERICAN: 38 mL/min — AB (ref >90)
GLUCOSE: 124 mg/dL — AB (ref 70–99)
POTASSIUM: 4.2 mmol/L (ref 3.5–5.1)
Sodium: 136 mmol/L (ref 135–145)
TOTAL PROTEIN: 7.6 g/dL (ref 6.0–8.3)

## 2014-09-09 LAB — URINALYSIS, ROUTINE W REFLEX MICROSCOPIC
Bilirubin Urine: NEGATIVE
Glucose, UA: NEGATIVE mg/dL
HGB URINE DIPSTICK: NEGATIVE
KETONES UR: NEGATIVE mg/dL
Leukocytes, UA: NEGATIVE
Nitrite: NEGATIVE
Protein, ur: NEGATIVE mg/dL
Specific Gravity, Urine: 1.015 (ref 1.005–1.030)
Urobilinogen, UA: 0.2 mg/dL (ref 0.0–1.0)
pH: 6 (ref 5.0–8.0)

## 2014-09-09 LAB — LIPASE, BLOOD: Lipase: 30 U/L (ref 11–59)

## 2014-09-09 LAB — CBC WITH DIFFERENTIAL/PLATELET
Basophils Absolute: 0 10*3/uL (ref 0.0–0.1)
Basophils Relative: 0 % (ref 0–1)
EOS ABS: 0.4 10*3/uL (ref 0.0–0.7)
Eosinophils Relative: 4 % (ref 0–5)
HCT: 39 % (ref 39.0–52.0)
Hemoglobin: 13.2 g/dL (ref 13.0–17.0)
Lymphocytes Relative: 21 % (ref 12–46)
Lymphs Abs: 2 10*3/uL (ref 0.7–4.0)
MCH: 28.9 pg (ref 26.0–34.0)
MCHC: 33.8 g/dL (ref 30.0–36.0)
MCV: 85.5 fL (ref 78.0–100.0)
Monocytes Absolute: 1.1 10*3/uL — ABNORMAL HIGH (ref 0.1–1.0)
Monocytes Relative: 11 % (ref 3–12)
NEUTROS PCT: 64 % (ref 43–77)
Neutro Abs: 6.2 10*3/uL (ref 1.7–7.7)
PLATELETS: 214 10*3/uL (ref 150–400)
RBC: 4.56 MIL/uL (ref 4.22–5.81)
RDW: 15.8 % — ABNORMAL HIGH (ref 11.5–15.5)
WBC: 9.7 10*3/uL (ref 4.0–10.5)

## 2014-09-09 LAB — I-STAT TROPONIN, ED: TROPONIN I, POC: 0.04 ng/mL (ref 0.00–0.08)

## 2014-09-09 MED ORDER — IOHEXOL 300 MG/ML  SOLN
25.0000 mL | Freq: Once | INTRAMUSCULAR | Status: AC | PRN
  Administered 2014-09-09: 25 mL via ORAL

## 2014-09-09 MED ORDER — IOHEXOL 300 MG/ML  SOLN
80.0000 mL | Freq: Once | INTRAMUSCULAR | Status: AC | PRN
  Administered 2014-09-09: 80 mL via INTRAVENOUS

## 2014-09-09 NOTE — ED Notes (Signed)
Pt here for generalized abd pain with nausea. X a few weeks. sts that worse after he eats. sts some diarrhea.

## 2014-09-09 NOTE — Discharge Instructions (Signed)
Diverticulosis Diverticulosis is the condition that develops when small pouches (diverticula) form in the wall of your colon. Your colon, or large intestine, is where water is absorbed and stool is formed. The pouches form when the inside layer of your colon pushes through weak spots in the outer layers of your colon. CAUSES  No one knows exactly what causes diverticulosis. RISK FACTORS  Being older than 50. Your risk for this condition increases with age. Diverticulosis is rare in people younger than 40 years. By age 80, almost everyone has it.  Eating a low-fiber diet.  Being frequently constipated.  Being overweight.  Not getting enough exercise.  Smoking.  Taking over-the-counter pain medicines, like aspirin and ibuprofen. SYMPTOMS  Most people with diverticulosis do not have symptoms. DIAGNOSIS  Because diverticulosis often has no symptoms, health care providers often discover the condition during an exam for other colon problems. In many cases, a health care provider will diagnose diverticulosis while using a flexible scope to examine the colon (colonoscopy). TREATMENT  If you have never developed an infection related to diverticulosis, you may not need treatment. If you have had an infection before, treatment may include:  Eating more fruits, vegetables, and grains.  Taking a fiber supplement.  Taking a live bacteria supplement (probiotic).  Taking medicine to relax your colon. HOME CARE INSTRUCTIONS   Drink at least 6-8 glasses of water each day to prevent constipation.  Try not to strain when you have a bowel movement.  Keep all follow-up appointments. If you have had an infection before:  Increase the fiber in your diet as directed by your health care provider or dietitian.  Take a dietary fiber supplement if your health care provider approves.  Only take medicines as directed by your health care provider. SEEK MEDICAL CARE IF:   You have abdominal  pain.  You have bloating.  You have cramps.  You have not gone to the bathroom in 3 days. SEEK IMMEDIATE MEDICAL CARE IF:   Your pain gets worse.  Yourbloating becomes very bad.  You have a fever or chills, and your symptoms suddenly get worse.  You begin vomiting.  You have bowel movements that are bloody or black. MAKE SURE YOU:  Understand these instructions.  Will watch your condition.  Will get help right away if you are not doing well or get worse. Document Released: 04/18/2004 Document Revised: 07/27/2013 Document Reviewed: 06/16/2013 Exit[employer] Patient Information 2015 [employer]. This information is not intended to replace advice given to you by your health care provider. Make sure you discuss any questions you have with your health care provider.  

## 2014-09-09 NOTE — ED Provider Notes (Signed)
CSN: [id number]     Arrival date & time 09/09/14  1528 History   First MD Initiated Contact with Patient 09/09/14 1831     Chief Complaint  Patient presents with  . Abdominal Pain     (Consider location/radiation/quality/duration/timing/severity/associated sxs/prior Treatment) HPI 81 year old male with past mental history as below notable forA. Fib, systolic CHF, coronary artery disease, hypertension, history of appendectomy, who presents ED complaining of left mid to lower abdominal pain for the past 2 days which has been intermittent during that time. Patient reports pain is mild when it does come on. No modifying factors could be identified. Patient states he is not currently having the pain. Patient denies having associated symptoms. No treatments tried. Patient is having normal bowel movements. No history of similar symptoms in the past. No other complaints at this time. Past Medical History  Diagnosis Date  . Mitral insufficiency     chronically moderate to severe  . Hypertension   . Hyperlipidemia   . 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 Coronary artery disease     a. Remote lateral infarct;  b. 09/2011 MV: large dense inferolat/lat scar - ? very slight peri-infarcrt isch.  No signif change c/w 09/2010 scan.  . Raynaud's disease /phenomenon   . Hard of hearing     bilaterally  . Gout   . PTSD (post-traumatic stress disorder)     "being treated for it now but not a big degree"  . Asthma   . Noncompliance with medication regimen   . Chronic systolic CHF (congestive heart failure)     a. 10/2011 Echo: EF 40-45%, inf AK, PASP 58mHg.  .Marland KitchenPersistent atrial fibrillation    Past Surgical History  Procedure Laterality Date  . Prostatectomy    . Tonsillectomy    . Inguinal hernia repair      left  . Appendectomy    . Cardiac catheterization  2003    SHOWED OCCLUSION OF THE LEFT CIRCUMFLEX CORONARY ARTERY  . Pacemaker insertion  03/28/14    STJ single chamber pacemaker  implanted by Dr KCaryl Comesfor symptomatic bradycardia  . Left heart catheterization with coronary angiogram N/A 08/29/2011    Procedure: LEFT HEART CATHETERIZATION WITH CORONARY ANGIOGRAM;  Surgeon: CBurnell Blanks MD;  [facility];  Service: Cardiovascular;  Laterality: N/A;  . Permanent pacemaker insertion N/A 03/28/2014    Procedure: PERMANENT PACEMAKER INSERTION;  Surgeon: SDeboraha Sprang MD;  [facility];  Service: Cardiovascular;  Laterality: N/A;   Family History  Problem Relation Age of Onset  . Heart attack Brother   . Alcohol abuse Father     died in his late 540's . Other Mother     died in her late 853's- old age.   History  Substance Use Topics  . Smoking status: Former Smoker -- 1.50 packs/day for 20 years    Quit date: 12/12/1970  . Smokeless tobacco: Never Used  . Alcohol Use: No    Review of Systems  Constitutional: Negative for fever, activity change and appetite change.  HENT: Negative for congestion, rhinorrhea and sore throat.   Eyes: Negative for visual disturbance.  Respiratory: Negative for cough and shortness of breath.   Cardiovascular: Negative for chest pain, palpitations and leg swelling.  Gastrointestinal: Positive for abdominal distention. Negative for nausea, vomiting, abdominal pain and diarrhea.  Genitourinary: Negative for dysuria, flank pain, decreased urine volume and difficulty urinating.  Musculoskeletal: Negative for  back pain and neck pain.  Skin: Negative for rash.  Neurological: Negative for dizziness, syncope, speech difficulty, weakness, light-headedness, numbness and headaches.  Psychiatric/Behavioral: Negative for confusion.      Allergies  Zocor  Home Medications   Prior to Admission medications   Medication Sig Start Date End Date Taking? Authorizing Provider  allopurinol (ZYLOPRIM) 300 MG tablet Take 300 mg by mouth daily.     Historical Provider, MD  amLODipine (NORVASC) 5 MG tablet Take 5 mg by  mouth daily.    Historical Provider, MD  apixaban (ELIQUIS) 2.5 MG TABS tablet Take 1 tablet (2.5 mg total) by mouth 2 (two) times daily. 08/12/14   Peter M Martinique, MD  atorvastatin (LIPITOR) 10 MG tablet Take 1 tablet (10 mg total) by mouth daily at 6 PM. 04/26/14   Peter M Martinique, MD  Cholecalciferol (VITAMIN D-3) 1000 UNITS CAPS Take 1,000 Units by mouth 2 (two) times daily.     Historical Provider, MD  Coenzyme Q10 (COQ10 PO) Take 1 capsule by mouth daily.     Historical Provider, MD  finasteride (PROSCAR) 5 MG tablet Take 5 mg by mouth every evening.     Historical Provider, MD  furosemide (LASIX) 20 MG tablet Take 2 tablets (40 mg total) by mouth daily. 05/25/14   Peter M Martinique, MD  isosorbide mononitrate (IMDUR) 60 MG 24 hr tablet Take 1 tablet (60 mg total) by mouth daily. 05/16/14   Peter M Martinique, MD  levothyroxine (SYNTHROID, LEVOTHROID) 50 MCG tablet Take 50 mcg by mouth daily before breakfast.    Historical Provider, MD  losartan (COZAAR) 50 MG tablet Take 1 tablet (50 mg total) by mouth daily. 05/23/14   Peter M Martinique, MD  nitroGLYCERIN (NITROSTAT) 0.4 MG SL tablet Place 1 tablet (0.4 mg total) under the tongue every 5 (five) minutes as needed. x3 doses as needed for chest pain 10/18/12   Rogelia Mire, NP   BP 127/58 mmHg  Pulse 55  Temp(Src) 97.6 F (36.4 C)  Resp 18  SpO2 99% Physical Exam  Constitutional: He is oriented to person, place, and time. He appears well-developed and well-nourished. No distress.  HENT:  Head: Normocephalic and atraumatic.  Nose: Nose normal.  Mouth/Throat: Oropharynx is clear and moist. No oropharyngeal exudate.  Eyes: Conjunctivae and EOM are normal.  Neck: Normal range of motion. Neck supple. No JVD present.  Cardiovascular: Regular rhythm, normal heart sounds and intact distal pulses.  Bradycardia present.   Pulmonary/Chest: Effort normal and breath sounds normal. No respiratory distress.  Abdominal: Soft. Normal appearance. He exhibits  no distension. There is no tenderness. There is no rigidity, no rebound, no guarding, no tenderness at McBurney's point and negative Murphy's sign.  Musculoskeletal: Normal range of motion.  Neurological: He is alert and oriented to person, place, and time. No cranial nerve deficit.  Skin: Skin is warm and dry. No rash noted.  Psychiatric: He has a normal mood and affect.  Nursing note and vitals reviewed.   ED Course  Procedures (including critical care time) Labs Review Labs Reviewed  CBC WITH DIFFERENTIAL/PLATELET - Abnormal; Notable for the following:    RDW 15.8 (*)    Monocytes Absolute 1.1 (*)    All other components within normal limits  COMPREHENSIVE METABOLIC PANEL - Abnormal; Notable for the following:    Glucose, Bld 124 (*)    Creatinine, Ser 1.50 (*)    GFR calc non Af Amer 38 (*)    GFR calc Af  Amer 44 (*)    All other components within normal limits  URINALYSIS, ROUTINE W REFLEX MICROSCOPIC - Abnormal; Notable for the following:    APPearance CLOUDY (*)    All other components within normal limits  LIPASE, BLOOD  I-STAT TROPOININ, ED    Imaging Review Ct Abdomen Pelvis W Contrast  09/09/2014   CLINICAL DATA:  Initial encounter for generalized abdominal pain with nausea. Symptoms for few weeks. Worse after eeat. Occasionally diarrhea.  EXAM: CT ABDOMEN AND PELVIS WITH CONTRAST  TECHNIQUE: Multidetector CT imaging of the abdomen and pelvis was performed using the standard protocol following bolus administration of intravenous contrast.  CONTRAST:  35m OMNIPAQUE IOHEXOL 300 MG/ML  SOLN  COMPARISON:  02/23/2013  FINDINGS: Lower chest: Mosaic attenuation within the lung bases, possibly related to air trapping or mild pulmonary venous congestion. Mild cardiomegaly with pacer in place. Small bilateral pleural effusions.  Hepatobiliary: Normal liver. Normal gallbladder, without biliary ductal dilatation.  Pancreas: Normal pancreas for age, with mild atrophy. No duct dilatation  or mass.  Spleen: Normal  Adrenals/Urinary Tract: Normal adrenal glands. Too small to characterize left renal lesion. An area of volume loss in the posterior interpolar right kidney image 12 series 6 is similar to on the prior exam. No hydronephrosis. Normal urinary bladder.  Stomach/Bowel: Normal stomach, without wall thickening. Extensive colonic diverticulosis. Normal terminal ileum. Appendix is not visualized but there is no evidence of right lower quadrant inflammation. Normal small bowel.  Vascular/Lymphatic: Aortic and branch vessel atherosclerosis. No abdominopelvic adenopathy.  Reproductive: Moderate prostatomegaly with enlargement of the median lobe.  Other: No significant free fluid. Suspect prior inguinal hernia repair bilaterally.  Musculoskeletal: Heterogeneous marrow density, possibly related to heterogeneous osteopenia. Similar to the 02/23/2013. Degenerative disc disease involves the lumbosacral junction.  IMPRESSION: 1.  No acute process in the abdomen or pelvis. 2. Prostatomegaly. 3. Small bilateral pleural effusions. Mosaic attenuation at the lung bases could represent mild pulmonary venous congestion or air trapping. 4. Volume loss and hypoattenuation in the interpolar right kidney is indeterminate. Possibly related to prior ablation site or a previously ruptured cyst. This is unchanged. 5. Heterogeneous marrow density has been present back to 10/02/2008. Given stability, favored to related to heterogeneous osteopenia. Multiple myeloma could look similar but is felt unlikely.   Electronically Signed   By: KAbigail MiyamotoM.D.   On: 09/09/2014 20:24     EKG Interpretation None      MDM   Final diagnoses:  None   Craig OLDENis a 81y.o. male with H&P as above. On arrival, patient is demented and stable and in no apparent distress. Abdominal exam is benign. Patient currently not having any pain. Given age and past medical/surgical history will get screening labs and CT of the  abdomen. Screening labs and CT are all unremarkable at this time. CT does show extensive colonic diverticulosis. Patient deemed stable for discharge home. Advised to follow closely with his PCP to discuss this diagnosis further.  Clinical Impression: 1. Abdominal pain, LLQ   2. Diverticulosis of large intestine without hemorrhage     Disposition: Discharge  Condition: Good  I have discussed the results, Dx and Tx plan with the pt(& family if present). He/she/they expressed understanding and agree(s) with the plan. Discharge instructions discussed at great length. Strict return precautions discussed and pt &/or family have verbalized understanding of the instructions. No further questions at time of discharge.    New Prescriptions   No medications on file  Follow Up: Maury Dus, MD [place] [address] [phone]  Schedule an appointment as soon as possible for a visit in 1 week for recheck   Pt seen in conjunction with Dr. Mariea Clonts, MD  Kirstie Peri, [facility] Resident - PGY-2      Kirstie Peri, MD 09/09/14 [id number]  Mariea Clonts, MD 09/10/14 [phone]

## 2014-09-20 DIAGNOSIS — L309 Dermatitis, unspecified: Secondary | ICD-10-CM | POA: Diagnosis not present

## 2014-09-20 DIAGNOSIS — K2970 Gastritis, unspecified, without bleeding: Secondary | ICD-10-CM | POA: Diagnosis not present

## 2014-09-20 DIAGNOSIS — K5790 Diverticulosis of intestine, part unspecified, without perforation or abscess without bleeding: Secondary | ICD-10-CM | POA: Diagnosis not present

## 2014-10-11 ENCOUNTER — Ambulatory Visit (INDEPENDENT_AMBULATORY_CARE_PROVIDER_SITE_OTHER): Payer: Medicare Other | Admitting: *Deleted

## 2014-10-11 DIAGNOSIS — R001 Bradycardia, unspecified: Secondary | ICD-10-CM | POA: Diagnosis not present

## 2014-10-11 NOTE — Progress Notes (Signed)
Remote pacemaker transmission.   

## 2014-10-12 DIAGNOSIS — K2970 Gastritis, unspecified, without bleeding: Secondary | ICD-10-CM | POA: Diagnosis not present

## 2014-10-12 DIAGNOSIS — R8299 Other abnormal findings in urine: Secondary | ICD-10-CM | POA: Diagnosis not present

## 2014-10-14 LAB — MDC_IDC_ENUM_SESS_TYPE_REMOTE
Lead Channel Impedance Value: 590 Ohm
Lead Channel Pacing Threshold Amplitude: 0.5 V
Lead Channel Pacing Threshold Pulse Width: 0.4 ms
Lead Channel Setting Pacing Pulse Width: 0.4 ms
Lead Channel Setting Sensing Sensitivity: 2 mV
MDC IDC MSMT BATTERY REMAINING LONGEVITY: 139 mo
MDC IDC MSMT BATTERY REMAINING PERCENTAGE: 95.5 %
MDC IDC MSMT BATTERY VOLTAGE: 3.04 V
MDC IDC MSMT LEADCHNL RV SENSING INTR AMPL: 8.6 mV
MDC IDC PG SERIAL: 7616716
MDC IDC SESS DTM: 20160308073527
MDC IDC SET LEADCHNL RV PACING AMPLITUDE: 0.75 V
MDC IDC STAT BRADY RV PERCENT PACED: 70 %

## 2014-10-20 ENCOUNTER — Encounter: Payer: Self-pay | Admitting: Cardiology

## 2014-11-01 ENCOUNTER — Encounter: Payer: Self-pay | Admitting: Internal Medicine

## 2014-11-08 DIAGNOSIS — K219 Gastro-esophageal reflux disease without esophagitis: Secondary | ICD-10-CM | POA: Diagnosis not present

## 2014-11-08 DIAGNOSIS — R1013 Epigastric pain: Secondary | ICD-10-CM | POA: Diagnosis not present

## 2014-11-08 DIAGNOSIS — Z86010 Personal history of colonic polyps: Secondary | ICD-10-CM | POA: Diagnosis not present

## 2014-11-15 DIAGNOSIS — Z86010 Personal history of colonic polyps: Secondary | ICD-10-CM | POA: Diagnosis not present

## 2014-12-05 DIAGNOSIS — K219 Gastro-esophageal reflux disease without esophagitis: Secondary | ICD-10-CM | POA: Diagnosis not present

## 2014-12-05 DIAGNOSIS — E782 Mixed hyperlipidemia: Secondary | ICD-10-CM | POA: Diagnosis not present

## 2014-12-05 DIAGNOSIS — M109 Gout, unspecified: Secondary | ICD-10-CM | POA: Diagnosis not present

## 2014-12-05 DIAGNOSIS — N400 Enlarged prostate without lower urinary tract symptoms: Secondary | ICD-10-CM | POA: Diagnosis not present

## 2014-12-05 DIAGNOSIS — R609 Edema, unspecified: Secondary | ICD-10-CM | POA: Diagnosis not present

## 2014-12-05 DIAGNOSIS — I110 Hypertensive heart disease with heart failure: Secondary | ICD-10-CM | POA: Diagnosis not present

## 2014-12-05 DIAGNOSIS — I481 Persistent atrial fibrillation: Secondary | ICD-10-CM | POA: Diagnosis not present

## 2014-12-05 DIAGNOSIS — I5022 Chronic systolic (congestive) heart failure: Secondary | ICD-10-CM | POA: Diagnosis not present

## 2015-01-12 ENCOUNTER — Ambulatory Visit (INDEPENDENT_AMBULATORY_CARE_PROVIDER_SITE_OTHER): Payer: Medicare Other | Admitting: *Deleted

## 2015-01-12 DIAGNOSIS — R001 Bradycardia, unspecified: Secondary | ICD-10-CM | POA: Diagnosis not present

## 2015-01-12 NOTE — Progress Notes (Signed)
Remote pacemaker transmission.   

## 2015-01-16 ENCOUNTER — Other Ambulatory Visit: Payer: Self-pay | Admitting: *Deleted

## 2015-01-16 DIAGNOSIS — F324 Major depressive disorder, single episode, in partial remission: Secondary | ICD-10-CM | POA: Diagnosis not present

## 2015-01-16 LAB — CUP PACEART REMOTE DEVICE CHECK
Battery Voltage: 3.04 V
Brady Statistic RV Percent Paced: 73 %
Date Time Interrogation Session: 20160609061640
Lead Channel Impedance Value: 600 Ohm
Lead Channel Pacing Threshold Amplitude: 0.5 V
Lead Channel Pacing Threshold Pulse Width: 0.4 ms
Lead Channel Sensing Intrinsic Amplitude: 11 mV
MDC IDC MSMT BATTERY REMAINING LONGEVITY: 138 mo
MDC IDC MSMT BATTERY REMAINING PERCENTAGE: 95.5 %
MDC IDC SET LEADCHNL RV PACING AMPLITUDE: 0.75 V
MDC IDC SET LEADCHNL RV PACING PULSEWIDTH: 0.4 ms
MDC IDC SET LEADCHNL RV SENSING SENSITIVITY: 2 mV
Pulse Gen Model: 1240
Pulse Gen Serial Number: 7616716

## 2015-01-16 MED ORDER — FUROSEMIDE 20 MG PO TABS: 40.0000 mg | ORAL_TABLET | Freq: Every day | ORAL | Status: DC

## 2015-01-24 ENCOUNTER — Emergency Department (HOSPITAL_COMMUNITY)
Admission: EM | Admit: 2015-01-24 | Discharge: 2015-01-24 | Disposition: A | Discharge status: Home / Self Care | Payer: Medicare Other | Attending: Emergency Medicine | Admitting: Emergency Medicine

## 2015-01-24 ENCOUNTER — Encounter (HOSPITAL_COMMUNITY): Payer: Self-pay | Admitting: Emergency Medicine

## 2015-01-24 DIAGNOSIS — Z9119 Patient's noncompliance with other medical treatment and regimen: Secondary | ICD-10-CM | POA: Insufficient documentation

## 2015-01-24 DIAGNOSIS — E785 Hyperlipidemia, unspecified: Secondary | ICD-10-CM | POA: Insufficient documentation

## 2015-01-24 DIAGNOSIS — R1033 Periumbilical pain: Secondary | ICD-10-CM | POA: Diagnosis present

## 2015-01-24 DIAGNOSIS — I2510 Atherosclerotic heart disease of native coronary artery without angina pectoris: Secondary | ICD-10-CM | POA: Diagnosis not present

## 2015-01-24 DIAGNOSIS — J45909 Unspecified asthma, uncomplicated: Secondary | ICD-10-CM | POA: Insufficient documentation

## 2015-01-24 DIAGNOSIS — I5022 Chronic systolic (congestive) heart failure: Secondary | ICD-10-CM | POA: Insufficient documentation

## 2015-01-24 DIAGNOSIS — Z87891 Personal history of nicotine dependence: Secondary | ICD-10-CM | POA: Diagnosis not present

## 2015-01-24 DIAGNOSIS — G8929 Other chronic pain: Secondary | ICD-10-CM | POA: Insufficient documentation

## 2015-01-24 DIAGNOSIS — R109 Unspecified abdominal pain: Secondary | ICD-10-CM | POA: Insufficient documentation

## 2015-01-24 DIAGNOSIS — Z79899 Other long term (current) drug therapy: Secondary | ICD-10-CM | POA: Diagnosis not present

## 2015-01-24 DIAGNOSIS — M109 Gout, unspecified: Secondary | ICD-10-CM | POA: Insufficient documentation

## 2015-01-24 DIAGNOSIS — I10 Essential (primary) hypertension: Secondary | ICD-10-CM | POA: Insufficient documentation

## 2015-01-24 LAB — URINALYSIS, ROUTINE W REFLEX MICROSCOPIC
BILIRUBIN URINE: NEGATIVE
GLUCOSE, UA: NEGATIVE mg/dL
Hgb urine dipstick: NEGATIVE
Ketones, ur: NEGATIVE mg/dL
LEUKOCYTES UA: NEGATIVE
NITRITE: NEGATIVE
PH: 6.5 (ref 5.0–8.0)
Protein, ur: NEGATIVE mg/dL
SPECIFIC GRAVITY, URINE: 1.009 (ref 1.005–1.030)
Urobilinogen, UA: 0.2 mg/dL (ref 0.0–1.0)

## 2015-01-24 LAB — CBC WITH DIFFERENTIAL/PLATELET
Basophils Absolute: 0 10*3/uL (ref 0.0–0.1)
Basophils Relative: 0 % (ref 0–1)
EOS PCT: 3 % (ref 0–5)
Eosinophils Absolute: 0.2 10*3/uL (ref 0.0–0.7)
HCT: 38.7 % — ABNORMAL LOW (ref 39.0–52.0)
Hemoglobin: 13.5 g/dL (ref 13.0–17.0)
LYMPHS ABS: 2.3 10*3/uL (ref 0.7–4.0)
LYMPHS PCT: 26 % (ref 12–46)
MCH: 29 pg (ref 26.0–34.0)
MCHC: 34.9 g/dL (ref 30.0–36.0)
MCV: 83.2 fL (ref 78.0–100.0)
MONOS PCT: 12 % (ref 3–12)
Monocytes Absolute: 1 10*3/uL (ref 0.1–1.0)
Neutro Abs: 5.2 10*3/uL (ref 1.7–7.7)
Neutrophils Relative %: 59 % (ref 43–77)
Platelets: 194 10*3/uL (ref 150–400)
RBC: 4.65 MIL/uL (ref 4.22–5.81)
RDW: 15.6 % — ABNORMAL HIGH (ref 11.5–15.5)
WBC: 8.7 10*3/uL (ref 4.0–10.5)

## 2015-01-24 LAB — COMPREHENSIVE METABOLIC PANEL
ALT: 33 U/L (ref 17–63)
AST: 29 U/L (ref 15–41)
Albumin: 4 g/dL (ref 3.5–5.0)
Alkaline Phosphatase: 101 U/L (ref 38–126)
Anion gap: 10 (ref 5–15)
BUN: 16 mg/dL (ref 6–20)
CO2: 25 mmol/L (ref 22–32)
CREATININE: 1.44 mg/dL — AB (ref 0.61–1.24)
Calcium: 9.4 mg/dL (ref 8.9–10.3)
Chloride: 99 mmol/L — ABNORMAL LOW (ref 101–111)
GFR calc Af Amer: 46 mL/min — ABNORMAL LOW (ref >60)
GFR, EST NON AFRICAN AMERICAN: 40 mL/min — AB (ref >60)
GLUCOSE: 125 mg/dL — AB (ref 65–99)
Potassium: 3.9 mmol/L (ref 3.5–5.1)
SODIUM: 134 mmol/L — AB (ref 135–145)
TOTAL PROTEIN: 7.5 g/dL (ref 6.5–8.1)
Total Bilirubin: 0.7 mg/dL (ref 0.3–1.2)

## 2015-01-24 LAB — LIPASE, BLOOD: Lipase: 24 U/L (ref 22–51)

## 2015-01-24 MED ORDER — ACETAMINOPHEN 325 MG PO TABS
650.0000 mg | ORAL_TABLET | Freq: Once | ORAL | Status: AC
  Administered 2015-01-24: 650 mg via ORAL
  Filled 2015-01-24: qty 2

## 2015-01-24 NOTE — ED Notes (Signed)
NAD at this time. Pt is stable and going home with his family.

## 2015-01-24 NOTE — Discharge Instructions (Signed)
It is okay to take Tylenol 650 mg  (2 regular strength tablets) every 4 hours as needed for pain or discomfort. Call Dr. Nicholos Johns if not improving in a few days. Otherwise keep your scheduled appointment for next month at his office. Return if concern for any reason.

## 2015-01-24 NOTE — ED Notes (Signed)
Pt sts lower to mid abd pain x weeks; pt sts generalized weakness

## 2015-01-24 NOTE — ED Provider Notes (Signed)
CSN: [id number]     Arrival date & time 01/24/15  1415 History   First MD Initiated Contact with Patient 01/24/15 1627     Chief Complaint  Patient presents with  . Abdominal Pain     (Consider location/radiation/quality/duration/timing/severity/associated sxs/prior Treatment) HPI Patient has been complaining of abdominal pain for the past 5 months daily, periumbilical, waxes and wanes, treated with ranitidine and Prilosec, without relief. He presents today as his pain was worse earlier today but presently it is mild. Nothing makes pain better or worse. Accompanying symptoms include diminished appetite for several months and has lost 10 pounds over the past 3 months. No other associated symptoms. He and his daughters report that he was evaluated by gastroenterology and endoscopy was decided against. No other associated symptoms, no urinary symptoms no fever. Last bowel movement yesterday, normal denies any blood per rectum Past Medical History  Diagnosis Date  . Mitral insufficiency     chronically moderate to severe  . Hypertension   . Hyperlipidemia   . 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 Coronary artery disease     a. Remote lateral infarct;  b. 09/2011 MV: large dense inferolat/lat scar - ? very slight peri-infarcrt isch.  No signif change c/w 09/2010 scan.  . Raynaud's disease /phenomenon   . Hard of hearing     bilaterally  . Gout   . PTSD (post-traumatic stress disorder)     "being treated for it now but not a big degree"  . Asthma   . Noncompliance with medication regimen   . Chronic systolic CHF (congestive heart failure)     a. 10/2011 Echo: EF 40-45%, inf AK, PASP .  Marland Kitchen Persistent atrial fibrillation    Past Surgical History  Procedure Laterality Date  . Prostatectomy    . Tonsillectomy    . Inguinal hernia repair      left  . Appendectomy    . Cardiac catheterization  2003    SHOWED OCCLUSION OF THE LEFT CIRCUMFLEX CORONARY ARTERY  . Pacemaker insertion   03/28/14    STJ single chamber pacemaker implanted by Dr Graciela Husbands for symptomatic bradycardia  . Left heart catheterization with coronary angiogram N/A 08/29/2011    Procedure: LEFT HEART CATHETERIZATION WITH CORONARY ANGIOGRAM;  Surgeon: Kathleene Hazel, MD;  Location: [facility] CATH LAB;  Service: Cardiovascular;  Laterality: N/A;  . Permanent pacemaker insertion N/A 03/28/2014    Procedure: PERMANENT PACEMAKER INSERTION;  Surgeon: Duke Salvia, MD;  Location: [facility] CATH LAB;  Service: Cardiovascular;  Laterality: N/A;   Family History  Problem Relation Age of Onset  . Heart attack Brother   . Alcohol abuse Father     died in his late 26's  . Other Mother     died in her late 26's - old age.   History  Substance Use Topics  . Smoking status: Former Smoker -- 1.50 packs/day for 20 years    Quit date: 12/12/1970  . Smokeless tobacco: Never Used  . Alcohol Use: No    Review of Systems  Constitutional: Positive for appetite change and unexpected weight change.  Gastrointestinal: Positive for abdominal pain.  All other systems reviewed and are negative.     Allergies  Zocor  Home Medications   Prior to Admission medications   Medication Sig Start Date End Date Taking? Authorizing Provider  allopurinol (ZYLOPRIM) 300 MG tablet Take 300 mg by mouth daily.     Historical Provider, MD  amLODipine (NORVASC) 5 MG tablet Take 5 mg by mouth daily.    Historical Provider, MD  apixaban (ELIQUIS) 2.5 MG TABS tablet Take 1 tablet (2.5 mg total) by mouth 2 (two) times daily. 08/12/14   Peter M Swaz[place], MD  atorvastatin (LIPITOR) 10 MG tablet Take 1 tablet (10 mg total) by mouth daily at 6 PM. 04/26/14   Peter M [place], MD  Cholecalciferol (VITAMIN D-3) 1000 UNITS CAPS Take 1,000 Units by mouth 2 (two) times daily.     Historical Provider, MD  Coenzyme Q10 (COQ10 PO) Take 1 capsule by mouth daily.     Historical Provider, MD  finasteride (PROSCAR) 5 MG tablet Take 5 mg by mouth every evening.      Historical Provider, MD  furosemide (LASIX) 20 MG tablet Take 2 tablets (40 mg total) by mouth daily. 01/16/15   Peter M [place], MD  isosorbide mononitrate (IMDUR) 60 MG 24 hr tablet Take 1 tablet (60 mg total) by mouth daily. 05/16/14   Peter M [place], MD  levothyroxine (SYNTHROID, LEVOTHROID) 50 MCG tablet Take 50 mcg by mouth daily before breakfast.    Historical Provider, MD  losartan (COZAAR) 50 MG tablet Take 1 tablet (50 mg total) by mouth daily. 05/23/14   Peter M [place], MD  nitroGLYCERIN (NITROSTAT) 0.4 MG SL tablet Place 1 tablet (0.4 mg total) under the tongue every 5 (five) minutes as needed. x3 doses as needed for chest pain 10/18/12   Ok Anis, NP   BP 113/61 mmHg  Pulse 60  Temp(Src) 97.6 F (36.4 C) (Oral)  Resp 18  Ht  (1.778 m)  Wt 174 lb 4.8 oz (79.062 kg)  BMI 25.01 kg/m2  SpO2 100% Physical Exam  Constitutional: He appears well-developed and well-nourished.  HENT:  Head: Normocephalic and atraumatic.  Eyes: Conjunctivae are normal. Pupils are equal, round, and reactive to light.  Neck: Neck supple. No tracheal deviation present. No thyromegaly present.  Cardiovascular: Normal rate and regular rhythm.   No murmur heard. Pulmonary/Chest: Effort normal and breath sounds normal.  Abdominal: Soft. Bowel sounds are normal. He exhibits no distension. There is no tenderness.  Genitourinary: Penis normal.  Musculoskeletal: Normal range of motion. He exhibits no edema or tenderness.  Neurological: He is alert. Coordination normal.  Skin: Skin is warm and dry. No rash noted.  Psychiatric: He has a normal mood and affect.  Nursing note and vitals reviewed.   ED Course  Procedures (including critical care time) Labs Review Labs Reviewed  CBC WITH DIFFERENTIAL/PLATELET - Abnormal; Notable for the following:    HCT 38.7 (*)    RDW 15.6 (*)    All other components within normal limits  COMPREHENSIVE METABOLIC PANEL - Abnormal; Notable for the following:     Sodium 134 (*)    Chloride 99 (*)    Glucose, Bld 125 (*)    Creatinine, Ser 1.44 (*)    GFR calc non Af Amer 40 (*)    GFR calc Af Amer 46 (*)    All other components within normal limits  LIPASE, BLOOD  URINALYSIS, ROUTINE W REFLEX MICROSCOPIC (NOT AT [facility])    Imaging Review No results found.   EKG Interpretation None     Results for orders placed or performed during the hospital encounter of 01/24/15  CBC with Differential  Result Value Ref Range   WBC 8.7 4.0 - 10.5 K/uL   RBC 4.65 4.22 - 5.81 MIL/uL   Hemoglobin 13.5 13.0 - 17.0  g/dL   HCT 67.5 (L) 44.9 - 20.1 %   MCV 83.2 78.0 - 100.0 fL   MCH 29.0 26.0 - 34.0 pg   MCHC 34.9 30.0 - 36.0 g/dL   RDW 00.7 (H) 12.1 - 97.5 %   Platelets 194 150 - 400 K/uL   Neutrophils Relative % 59 43 - 77 %   Neutro Abs 5.2 1.7 - 7.7 K/uL   Lymphocytes Relative 26 12 - 46 %   Lymphs Abs 2.3 0.7 - 4.0 K/uL   Monocytes Relative 12 3 - 12 %   Monocytes Absolute 1.0 0.1 - 1.0 K/uL   Eosinophils Relative 3 0 - 5 %   Eosinophils Absolute 0.2 0.0 - 0.7 K/uL   Basophils Relative 0 0 - 1 %   Basophils Absolute 0.0 0.0 - 0.1 K/uL  Comprehensive metabolic panel  Result Value Ref Range   Sodium 134 (L) 135 - 145 mmol/L   Potassium 3.9 3.5 - 5.1 mmol/L   Chloride 99 (L) 101 - 111 mmol/L   CO2 25 22 - 32 mmol/L   Glucose, Bld 125 (H) 65 - 99 mg/dL   BUN 16 6 - 20 mg/dL   Creatinine, Ser 8.83 (H) 0.61 - 1.24 mg/dL   Calcium 9.4 8.9 - 25.4 mg/dL   Total Protein 7.5 6.5 - 8.1 g/dL   Albumin 4.0 3.5 - 5.0 g/dL   AST 29 15 - 41 U/L   ALT 33 17 - 63 U/L   Alkaline Phosphatase 101 38 - 126 U/L   Total Bilirubin 0.7 0.3 - 1.2 mg/dL   GFR calc non Af Amer 40 (L) >60 mL/min   GFR calc Af Amer 46 (L) >60 mL/min   Anion gap 10 5 - 15  Lipase, blood  Result Value Ref Range   Lipase 24 22 - 51 U/L   No results found.  MDM  I had a lengthy discussion with patient and with his daughter's. I discussed the possibility of cancer, and obtaining  a CT scan. It was decided against as all parties felt that if we found pathology it would not be acted upon. All parties wished that patient is to be made comfortable. I suggest Tylenol, follow-up with Dr.Reade Final diagnoses:  None   Dx is #1 chronic abdominal pain #2 renal insufficiency     Doug Sou, MD 01/24/15 1724

## 2015-01-25 ENCOUNTER — Encounter: Payer: Self-pay | Admitting: Cardiology

## 2015-02-07 ENCOUNTER — Encounter: Payer: Self-pay | Admitting: Internal Medicine

## 2015-02-13 ENCOUNTER — Emergency Department (HOSPITAL_COMMUNITY): Payer: Medicare Other

## 2015-02-13 ENCOUNTER — Emergency Department (HOSPITAL_COMMUNITY)
Admission: EM | Admit: 2015-02-13 | Discharge: 2015-02-14 | Disposition: A | Discharge status: Home / Self Care | Payer: Medicare Other | Attending: Emergency Medicine | Admitting: Emergency Medicine

## 2015-02-13 DIAGNOSIS — R531 Weakness: Secondary | ICD-10-CM | POA: Diagnosis not present

## 2015-02-13 DIAGNOSIS — R404 Transient alteration of awareness: Secondary | ICD-10-CM | POA: Diagnosis not present

## 2015-02-13 DIAGNOSIS — F4310 Post-traumatic stress disorder, unspecified: Secondary | ICD-10-CM | POA: Insufficient documentation

## 2015-02-13 DIAGNOSIS — H9193 Unspecified hearing loss, bilateral: Secondary | ICD-10-CM | POA: Diagnosis not present

## 2015-02-13 DIAGNOSIS — J45909 Unspecified asthma, uncomplicated: Secondary | ICD-10-CM | POA: Diagnosis not present

## 2015-02-13 DIAGNOSIS — I2510 Atherosclerotic heart disease of native coronary artery without angina pectoris: Secondary | ICD-10-CM | POA: Diagnosis not present

## 2015-02-13 DIAGNOSIS — R51 Headache: Secondary | ICD-10-CM | POA: Insufficient documentation

## 2015-02-13 DIAGNOSIS — Z79899 Other long term (current) drug therapy: Secondary | ICD-10-CM | POA: Diagnosis not present

## 2015-02-13 DIAGNOSIS — I7300 Raynaud's syndrome without gangrene: Secondary | ICD-10-CM | POA: Diagnosis not present

## 2015-02-13 DIAGNOSIS — M109 Gout, unspecified: Secondary | ICD-10-CM | POA: Insufficient documentation

## 2015-02-13 DIAGNOSIS — R29810 Facial weakness: Secondary | ICD-10-CM | POA: Diagnosis not present

## 2015-02-13 DIAGNOSIS — Z9889 Other specified postprocedural states: Secondary | ICD-10-CM | POA: Diagnosis not present

## 2015-02-13 DIAGNOSIS — Z950 Presence of cardiac pacemaker: Secondary | ICD-10-CM | POA: Insufficient documentation

## 2015-02-13 DIAGNOSIS — Z87891 Personal history of nicotine dependence: Secondary | ICD-10-CM | POA: Insufficient documentation

## 2015-02-13 DIAGNOSIS — Z9119 Patient's noncompliance with other medical treatment and regimen: Secondary | ICD-10-CM | POA: Insufficient documentation

## 2015-02-13 DIAGNOSIS — E785 Hyperlipidemia, unspecified: Secondary | ICD-10-CM | POA: Insufficient documentation

## 2015-02-13 DIAGNOSIS — J439 Emphysema, unspecified: Secondary | ICD-10-CM | POA: Diagnosis not present

## 2015-02-13 DIAGNOSIS — I10 Essential (primary) hypertension: Secondary | ICD-10-CM | POA: Diagnosis not present

## 2015-02-13 DIAGNOSIS — I5022 Chronic systolic (congestive) heart failure: Secondary | ICD-10-CM | POA: Insufficient documentation

## 2015-02-13 LAB — I-STAT CHEM 8, ED
BUN: 20 mg/dL (ref 6–20)
CALCIUM ION: 1.13 mmol/L (ref 1.13–1.30)
CREATININE: 1.2 mg/dL (ref 0.61–1.24)
Chloride: 101 mmol/L (ref 101–111)
Glucose, Bld: 87 mg/dL (ref 65–99)
HCT: 44 % (ref 39.0–52.0)
HEMOGLOBIN: 15 g/dL (ref 13.0–17.0)
Potassium: 3.8 mmol/L (ref 3.5–5.1)
Sodium: 137 mmol/L (ref 135–145)
TCO2: 23 mmol/L (ref 0–100)

## 2015-02-13 LAB — CBC WITH DIFFERENTIAL/PLATELET
BASOS PCT: 0 % (ref 0–1)
Basophils Absolute: 0 10*3/uL (ref 0.0–0.1)
EOS PCT: 3 % (ref 0–5)
Eosinophils Absolute: 0.3 10*3/uL (ref 0.0–0.7)
HCT: 39.1 % (ref 39.0–52.0)
Hemoglobin: 13.5 g/dL (ref 13.0–17.0)
LYMPHS PCT: 26 % (ref 12–46)
Lymphs Abs: 2.4 10*3/uL (ref 0.7–4.0)
MCH: 28.8 pg (ref 26.0–34.0)
MCHC: 34.5 g/dL (ref 30.0–36.0)
MCV: 83.5 fL (ref 78.0–100.0)
Monocytes Absolute: 1.1 10*3/uL — ABNORMAL HIGH (ref 0.1–1.0)
Monocytes Relative: 12 % (ref 3–12)
NEUTROS PCT: 59 % (ref 43–77)
Neutro Abs: 5.5 10*3/uL (ref 1.7–7.7)
Platelets: 176 10*3/uL (ref 150–400)
RBC: 4.68 MIL/uL (ref 4.22–5.81)
RDW: 16.2 % — ABNORMAL HIGH (ref 11.5–15.5)
WBC: 9.3 10*3/uL (ref 4.0–10.5)

## 2015-02-13 LAB — URINALYSIS, ROUTINE W REFLEX MICROSCOPIC
Bilirubin Urine: NEGATIVE
GLUCOSE, UA: NEGATIVE mg/dL
Hgb urine dipstick: NEGATIVE
Ketones, ur: NEGATIVE mg/dL
LEUKOCYTES UA: NEGATIVE
Nitrite: NEGATIVE
PH: 7 (ref 5.0–8.0)
Protein, ur: NEGATIVE mg/dL
SPECIFIC GRAVITY, URINE: 1.009 (ref 1.005–1.030)
Urobilinogen, UA: 1 mg/dL (ref 0.0–1.0)

## 2015-02-13 LAB — COMPREHENSIVE METABOLIC PANEL
ALBUMIN: 4 g/dL (ref 3.5–5.0)
ALT: 35 U/L (ref 17–63)
AST: 29 U/L (ref 15–41)
Alkaline Phosphatase: 93 U/L (ref 38–126)
Anion gap: 12 (ref 5–15)
BUN: 16 mg/dL (ref 6–20)
CO2: 23 mmol/L (ref 22–32)
Calcium: 9.6 mg/dL (ref 8.9–10.3)
Chloride: 101 mmol/L (ref 101–111)
Creatinine, Ser: 1.35 mg/dL — ABNORMAL HIGH (ref 0.61–1.24)
GFR, EST AFRICAN AMERICAN: 50 mL/min — AB (ref >60)
GFR, EST NON AFRICAN AMERICAN: 43 mL/min — AB (ref >60)
Glucose, Bld: 89 mg/dL (ref 65–99)
Potassium: 3.8 mmol/L (ref 3.5–5.1)
Sodium: 136 mmol/L (ref 135–145)
Total Bilirubin: 0.7 mg/dL (ref 0.3–1.2)
Total Protein: 7.2 g/dL (ref 6.5–8.1)

## 2015-02-13 LAB — I-STAT CG4 LACTIC ACID, ED: Lactic Acid, Venous: 3.25 mmol/L (ref 0.5–2.0)

## 2015-02-13 LAB — I-STAT TROPONIN, ED: Troponin i, poc: 0.06 ng/mL (ref 0.00–0.08)

## 2015-02-13 LAB — AMMONIA: AMMONIA: 22 umol/L (ref 9–35)

## 2015-02-13 NOTE — ED Notes (Signed)
Per EMS, pt started feeling weak around 0900 this AM. Pt's family told EMS that the pt was "acting funny" around 1845, stating that he was not walking well, slow to respond. When first responders arrived, they reported that the pt was almost catatonic, staring off into space. Pt was able to drive himself to church yesterday morning. Pt states that he has been feeling weaker and having less energy x 1 month. Pt also reported a headache when he rolled in the room around 2027.

## 2015-02-13 NOTE — Discharge Instructions (Signed)
The cause of your weakness is unclear. At this time I do not know what made U feel weak today. At this time your testing has not shown us a definite answer. I have strongly encouraged her to stay in the hospital tonight for observation but you have refused. You may return at any time for repeat evaluation and I strongly encouraged follow-up within 24 hours with her family doctor for a recheck.  Please call your doctor for a followup appointment within 24-48 hours. When you talk to your doctor please let them know that you were seen in the emergency department and have them acquire all of your records so that they can discuss the findings with you and formulate a treatment plan to fully care for your new and ongoing problems.   Labs and CT scan reviewedLabs

## 2015-02-13 NOTE — ED Provider Notes (Signed)
CSN: [id number]     Arrival date & time 02/13/15  2028 History   First MD Initiated Contact with Patient 02/13/15 2035     Chief Complaint  Patient presents with  . Weakness     (Consider location/radiation/quality/duration/timing/severity/associated sxs/prior Treatment) HPI Comments: Pt has a hx of  generalized weakness which she states has been progressive over the last month stating that he has not been able to mow his grass in over one month. He was able to drive himself to church yesterday, he has been ambulatory with minimal difficulty at his baseline but states that today he has been more weak. Family reported to the paramedics that he was not acting right approximately 2 hours ago, he was not walking well, he was slow to respond, when the paramedics arrived they noted that he was essentially catatonic staring off into space but was responsive to verbal stimuli and has been interactive since that time. The patient denies fevers chills chest pain and belly pain and back pain swelling rashes diarrhea or dysuria though he does report some difficulty breathing stating that he has some shortness of breath but no cough.  Patient is a 81 y.o. male presenting with weakness. The history is provided by the patient.  Weakness    Past Medical History  Diagnosis Date  . Mitral insufficiency     chronically moderate to severe  . Hypertension   . Hyperlipidemia   . 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 Coronary artery disease     a. Remote lateral infarct;  b. 09/2011 MV: large dense inferolat/lat scar - ? very slight peri-infarcrt isch.  No signif change c/w 09/2010 scan.  . Raynaud's disease /phenomenon   . Hard of hearing     bilaterally  . Gout   . PTSD (post-traumatic stress disorder)     "being treated for it now but not a big degree"  . Asthma   . Noncompliance with medication regimen   . Chronic systolic CHF (congestive heart failure)     a. 10/2011 Echo: EF 40-45%, inf AK, PASP  57mmHg.  Marland Kitchen. Persistent atrial fibrillation    Past Surgical History  Procedure Laterality Date  . Prostatectomy    . Tonsillectomy    . Inguinal hernia repair      left  . Appendectomy    . Cardiac catheterization  2003    SHOWED OCCLUSION OF THE LEFT CIRCUMFLEX CORONARY ARTERY  . Pacemaker insertion  03/28/14    STJ single chamber pacemaker implanted by Dr Graciela HusbandsKlein for symptomatic bradycardia  . Left heart catheterization with coronary angiogram N/A 08/29/2011    Procedure: LEFT HEART CATHETERIZATION WITH CORONARY ANGIOGRAM;  Surgeon: Kathleene Hazelhristopher D McAlhany, MD;  [facility];  Service: Cardiovascular;  Laterality: N/A;  . Permanent pacemaker insertion N/A 03/28/2014    Procedure: PERMANENT PACEMAKER INSERTION;  Surgeon: Duke SalviaSteven C Klein, MD;  [facility];  Service: Cardiovascular;  Laterality: N/A;   Family History  Problem Relation Age of Onset  . Heart attack Brother   . Alcohol abuse Father     died in his late 3350's  . Other Mother     died in her late 2780's - old age.   History  Substance Use Topics  . Smoking status: Former Smoker -- 1.50 packs/day for 20 years    Quit date: 12/12/1970  . Smokeless tobacco: Never Used  . Alcohol Use: No    Review of Systems  Neurological: Positive for weakness.  All other systems reviewed and are negative.     Allergies  Zocor  Home Medications   Prior to Admission medications   Medication Sig Start Date End Date Taking? Authorizing Provider  acetaminophen (TYLENOL) 500 MG tablet Take 500 mg by mouth every 6 (six) hours as needed for moderate pain.   Yes Historical Provider, MD  allopurinol (ZYLOPRIM) 300 MG tablet Take 450 mg by mouth daily.    Yes Historical Provider, MD  amLODipine (NORVASC) 5 MG tablet Take 5 mg by mouth daily.   Yes Historical Provider, MD  apixaban (ELIQUIS) 2.5 MG TABS tablet Take 1 tablet (2.5 mg total) by mouth 2 (two) times daily. 08/12/14  Yes Peter M [place], MD  atorvastatin (LIPITOR) 10  MG tablet Take 1 tablet (10 mg total) by mouth daily at 6 PM. 04/26/14  Yes Peter M [place], MD  Cholecalciferol (VITAMIN D-3) 1000 UNITS CAPS Take 1,000 Units by mouth 2 (two) times daily.    Yes Historical Provider, MD  Coenzyme Q10 (COQ10 PO) Take 1 capsule by mouth daily.    Yes Historical Provider, MD  finasteride (PROSCAR) 5 MG tablet Take 5 mg by mouth every evening.    Yes Historical Provider, MD  furosemide (LASIX) 20 MG tablet Take 2 tablets (40 mg total) by mouth daily. 01/16/15  Yes Peter M [place], MD  isosorbide mononitrate (IMDUR) 60 MG 24 hr tablet Take 1 tablet (60 mg total) by mouth daily. 05/16/14  Yes Peter M [place], MD  levothyroxine (SYNTHROID, LEVOTHROID) 50 MCG tablet Take 50 mcg by mouth daily before breakfast.   Yes Historical Provider, MD  losartan (COZAAR) 50 MG tablet Take 1 tablet (50 mg total) by mouth daily. 05/23/14  Yes Peter M [place], MD  nitroGLYCERIN (NITROSTAT) 0.4 MG SL tablet Place 1 tablet (0.4 mg total) under the tongue every 5 (five) minutes as needed. x3 doses as needed for chest pain 10/18/12  Yes Ok Anis, NP  omeprazole (PRILOSEC) 20 MG capsule Take 40 mg by mouth daily.    Yes Historical Provider, MD  PARoxetine (PAXIL) 10 MG tablet Take 10 mg by mouth every evening.   Yes Historical Provider, MD  ranitidine (ZANTAC) 300 MG capsule Take 300 mg by mouth every evening.   Yes Historical Provider, MD   BP 127/65 mmHg  Pulse 118  Temp(Src) 98.9 F (37.2 C) (Oral)  Resp 17  Ht 5\' 10"  (1.778 m)  Wt 178 lb 12.7 oz (81.1 kg)  BMI 25.65 kg/m2  SpO2 89% Physical Exam  Constitutional: He appears well-developed and well-nourished. No distress.  HENT:  Head: Normocephalic and atraumatic.  Mouth/Throat: Oropharynx is clear and moist. No oropharyngeal exudate.  Eyes: Conjunctivae and EOM are normal. Pupils are equal, round, and reactive to light. Right eye exhibits no discharge. Left eye exhibits no discharge. No scleral icterus.  Neck: Normal range  of motion. Neck supple. No JVD present. No thyromegaly present.  Cardiovascular: Normal rate, regular rhythm, normal heart sounds and intact distal pulses.  Exam reveals no gallop and no friction rub.   No murmur heard. Pulmonary/Chest: Effort normal and breath sounds normal. No respiratory distress. He has no wheezes. He has no rales.  Pacer in the L upper chest wall - non tender, no redness or swelling  Abdominal: Soft. Bowel sounds are normal. He exhibits no distension and no mass. There is no tenderness.  Musculoskeletal: Normal range of motion. He exhibits no edema or tenderness.  Lymphadenopathy:  He has no cervical adenopathy.  Neurological: He is alert. Coordination normal.  Normal strength in bilateral UE's, able to barely lift both legs off the bed, normal sensation in all 4 extremities, normal speech, coordination and memory.  Alert and oriented to name, date, location and general questions.  Skin: Skin is warm and dry. No rash noted. No erythema.  Psychiatric: He has a normal mood and affect. His behavior is normal.  Nursing note and vitals reviewed.   ED Course  Procedures (including critical care time) Labs Review Labs Reviewed  CBC WITH DIFFERENTIAL/PLATELET - Abnormal; Notable for the following:    RDW 16.2 (*)    Monocytes Absolute 1.1 (*)    All other components within normal limits  COMPREHENSIVE METABOLIC PANEL - Abnormal; Notable for the following:    Creatinine, Ser 1.35 (*)    GFR calc non Af Amer 43 (*)    GFR calc Af Amer 50 (*)    All other components within normal limits  I-STAT CG4 LACTIC ACID, ED - Abnormal; Notable for the following:    Lactic Acid, Venous 3.25 (*)    All other components within normal limits  URINALYSIS, ROUTINE W REFLEX MICROSCOPIC (NOT AT [facility])  AMMONIA  I-STAT TROPOININ, ED  I-STAT CHEM 8, ED  I-STAT CG4 LACTIC ACID, ED    Imaging Review Dg Chest 2 View  02/13/2015   CLINICAL DATA:  81 year old male with weakness  EXAM:  CHEST  2 VIEW  COMPARISON:  Chest radiograph 04/16/2014  FINDINGS: Two views of the chest demonstrate emphysematous changes of the lungs. There is no focal consolidation. There is blunting of the left costophrenic angle likely a small pleural effusion. Stable cardiac silhouette. Left pectoral pacemaker device. The osseous structures are grossly unremarkable.  IMPRESSION: Emphysema.  No focal consolidation or pneumothorax.  Probable Small left pleural effusion.   Electronically Signed   By: Elgie Collard M.D.   On: 02/13/2015 21:06   Ct Head Wo Contrast  02/13/2015   CLINICAL DATA:  81 year old male with a history of weakness  EXAM: CT HEAD WITHOUT CONTRAST  TECHNIQUE: Contiguous axial images were obtained from the base of the skull through the vertex without intravenous contrast.  COMPARISON:  06/11/2014, prior MRI 08/05/2011  FINDINGS: Unremarkable appearance of the calvarium without acute fracture or aggressive lesion.  Unremarkable appearance of the scalp soft tissues.  Unremarkable appearance of the bilateral orbits.  Mastoid air cells are clear.  No significant paranasal sinus disease  No acute intracranial hemorrhage.  No midline shift or mass effect.  Diffuse brain volume loss.  Configuration the ventricles is unchanged, with dilation that is not out of proportion to the degree of sulcal prominence. Confluent hypodensity in the periventricular white matter.  Gray-white differentiation is maintained. Intracranial atherosclerotic calcifications.  IMPRESSION: No CT evidence of acute intracranial abnormality.  Brain volume loss with changes of chronic microvascular ischemic disease, similar to prior.  Signed,  Yvone Neu. Loreta Ave, DO  [facility]  [facility]   Electronically Signed   By: Gilmer Mor D.O.   On: 02/13/2015 21:53     EKG Interpretation   Date/Time:  Monday February 13 2015 20:41:25 EDT Ventricular Rate:  66 PR Interval:    QRS Duration:  138 QT Interval:  526 QTC Calculation: 551 R Axis:   -80 Text Interpretation:  Afib/flutter and ventricular-paced rhythm No further  analysis attempted due to paced rhythm Abnormal ekg since last tracing no  significant change Confirmed  by Hyacinth Meeker  MD, Astha Probasco ([id number]) on 02/13/2015  8:47:26 PM      MDM   Final diagnoses:  Weakness  Weakness      At this time the patient is answering questions appropriately, he appears generally weak but does not have any focality to his exam. he will need a workup for generalized weakness, this would include chest x-ray, urinalysis, labs, CT scan of the brain, ammonia. Patient is in agreement with the plan.  Labs and CT reviewed and are neg for acute findings  Family members are now present at the bedside and describe a period of altered MS which was dramatic but seems to have resolved - they report some facial droop and diffuse weakness which was dense and bilateral in UE and LE's.  There was no seizure, no other c/o.  He is back to baseline now - we have all passionately pleaded with Mr. Gawron to stay for observation but he has refused - at this time he has medical decision making capacity - his VS are WNL.    Eber Hong, MD 02/13/15 [id number]

## 2015-02-13 NOTE — ED Notes (Signed)
Pt states that he feels "good" and that he wants to go home.

## 2015-02-16 ENCOUNTER — Encounter: Payer: Self-pay | Admitting: Cardiology

## 2015-02-16 ENCOUNTER — Ambulatory Visit (INDEPENDENT_AMBULATORY_CARE_PROVIDER_SITE_OTHER): Payer: Medicare Other | Admitting: Cardiology

## 2015-02-16 VITALS — BP 136/72 | HR 74 | Ht 70.0 in | Wt 180.4 lb

## 2015-02-16 DIAGNOSIS — I441 Atrioventricular block, second degree: Secondary | ICD-10-CM

## 2015-02-16 DIAGNOSIS — I483 Typical atrial flutter: Secondary | ICD-10-CM

## 2015-02-16 DIAGNOSIS — I481 Persistent atrial fibrillation: Secondary | ICD-10-CM

## 2015-02-16 DIAGNOSIS — I2510 Atherosclerotic heart disease of native coronary artery without angina pectoris: Secondary | ICD-10-CM | POA: Diagnosis not present

## 2015-02-16 DIAGNOSIS — I5022 Chronic systolic (congestive) heart failure: Secondary | ICD-10-CM | POA: Diagnosis not present

## 2015-02-16 DIAGNOSIS — I4819 Other persistent atrial fibrillation: Secondary | ICD-10-CM

## 2015-02-16 NOTE — Progress Notes (Signed)
Craig [name] Date of Birth: 1918-08-15 Medical Record #[id number]  History of Present Illness: Craig [name] is seen for followup after recent ED visit. He has a history of CAD with chronic occlusion of the LCx- last cardiac cath in 2003. He also has a history of CHF with EF of 45-50%- by Echo December 2015. He has moderate  MR managed medically. He has a history of atrial flutter and is on Eliquis. He has a history of Mobitz type 1 AV block when in NSR. Prior Holter monitor in 9/13 showed AV block without indication for pacemaker.  In May he wore an event monitor  showing atrial flutter with slow ventricular response as low as 40 bpm. He underwent pacemaker implant in August 2015.  On July 11 he presented to the ED after his daughter found him unresponsive at home. He was just staring into space. He couldn't speak or move. EMS was called and initial vitals and BS were normal. He gradually came out of it and by the time he got to the ED he was responsive without deficit. Labs, Ecg, CXR, and head CT were unremarkable. He has had no further symptoms. He is quite frustrated with his quality of life. He states he has no energy and feels no strength. He states he is not able to do the things he likes to do. He tells me he just wants to die and wanted to kill himself- although he has not acted on this and has no plans to do so. His daughters feel he is more depressed and needs to talk to someone about this.   Current Outpatient Prescriptions on File Prior to Visit  Medication Sig Dispense Refill  . acetaminophen (TYLENOL) 500 MG tablet Take 500 mg by mouth every 6 (six) hours as needed for moderate pain.    Marland Kitchen allopurinol (ZYLOPRIM) 300 MG tablet Take 450 mg by mouth daily.     Marland Kitchen amLODipine (NORVASC) 5 MG tablet Take 5 mg by mouth daily.    Marland Kitchen apixaban (ELIQUIS) 2.5 MG TABS tablet Take 1 tablet (2.5 mg total) by mouth 2 (two) times daily. 60 tablet 5  . Cholecalciferol (VITAMIN D-3) 1000 UNITS CAPS Take  1,000 Units by mouth 2 (two) times daily.     . finasteride (PROSCAR) 5 MG tablet Take 5 mg by mouth every evening.     . furosemide (LASIX) 20 MG tablet Take 2 tablets (40 mg total) by mouth daily. 60 tablet 2  . isosorbide mononitrate (IMDUR) 60 MG 24 hr tablet Take 1 tablet (60 mg total) by mouth daily. 30 tablet 11  . levothyroxine (SYNTHROID, LEVOTHROID) 50 MCG tablet Take 50 mcg by mouth daily before breakfast.    . losartan (COZAAR) 50 MG tablet Take 1 tablet (50 mg total) by mouth daily. 30 tablet 6  . nitroGLYCERIN (NITROSTAT) 0.4 MG SL tablet Place 1 tablet (0.4 mg total) under the tongue every 5 (five) minutes as needed. x3 doses as needed for chest pain 25 tablet 3  . omeprazole (PRILOSEC) 20 MG capsule Take 40 mg by mouth daily.     Marland Kitchen PARoxetine (PAXIL) 10 MG tablet Take 10 mg by mouth every evening.    . ranitidine (ZANTAC) 300 MG capsule Take 300 mg by mouth every evening.     No current facility-administered medications on file prior to visit.    Allergies  Allergen Reactions  . Zocor [Simvastatin] Other (See Comments)    Unknown reaction per pt  Past Medical History  Diagnosis Date  . Mitral insufficiency     chronically moderate to severe  . Hypertension   . Hyperlipidemia   . 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 Coronary artery disease     a. Remote lateral infarct;  b. 09/2011 MV: large dense inferolat/lat scar - ? very slight peri-infarcrt isch.  No signif change c/w 09/2010 scan.  . Raynaud's disease /phenomenon   . Hard of hearing     bilaterally  . Gout   . PTSD (post-traumatic stress disorder)     "being treated for it now but not a big degree"  . Asthma   . Noncompliance with medication regimen   . Chronic systolic CHF (congestive heart failure)     a. 10/2011 Echo: EF 40-45%, inf AK, PASP .  Marland Kitchen Persistent atrial fibrillation     Past Surgical History  Procedure Laterality Date  . Prostatectomy    . Tonsillectomy    . Inguinal hernia  repair      left  . Appendectomy    . Cardiac catheterization  2003    SHOWED OCCLUSION OF THE LEFT CIRCUMFLEX CORONARY ARTERY  . Pacemaker insertion  03/28/14    STJ single chamber pacemaker implanted by Dr Graciela Husbands for symptomatic bradycardia  . Left heart catheterization with coronary angiogram N/A 08/29/2011    Procedure: LEFT HEART CATHETERIZATION WITH CORONARY ANGIOGRAM;  Surgeon: Kathleene Hazel, MD;  Location: [facility] CATH LAB;  Service: Cardiovascular;  Laterality: N/A;  . Permanent pacemaker insertion N/A 03/28/2014    Procedure: PERMANENT PACEMAKER INSERTION;  Surgeon: Duke Salvia, MD;  Location: [facility] CATH LAB;  Service: Cardiovascular;  Laterality: N/A;    History  Smoking status  . Former Smoker -- 1.50 packs/day for 20 years  . Quit date: 12/12/1970  Smokeless tobacco  . Never Used    History  Alcohol Use No    Family History  Problem Relation Age of Onset  . Heart attack Brother   . Alcohol abuse Father     died in his late 46's  . Other Mother     died in her late 108's - old age.    Review of Systems: As noted in history of present illness  All other systems were reviewed and are negative.  Physical Exam: BP 136/72 mmHg  Pulse 74  Ht 5\' 10"  (1.778 m)  Wt 81.829 kg (180 lb 6.4 oz)  BMI 25.88 kg/m2 He is an elderly white male, pleasant, in no distress. He is balding. He wears a hearing aid. Pupils are equal round and reactive. Sclera are clear. Oropharynx is clear. Neck is without JVD, adenopathy, thyromegaly, or bruits. Lungs are clear to auscultation and percussion. Cardiac exam reveals a regular rate and rhythm with a grade 2/6 systolic murmur at the apex. Abdomen is soft and nontender without organomegaly or masses. Extremities are without edema or cyanosis. Pulses are 2+. Skin is warm and dry. Neurologic exam reveals no focal abnormalities. Mood is appropriate and he even laughed a bit.   LABORATORY DATA:   Lab Results  Component Value Date    WBC 9.3 02/13/2015   HGB 15.0 02/13/2015   HCT 44.0 02/13/2015   PLT 176 02/13/2015   GLUCOSE 87 02/13/2015   CHOL 131 08/29/2011   TRIG 75 08/29/2011   HDL 46 08/29/2011   LDLCALC 70 08/29/2011   ALT 35 02/13/2015   AST 29 02/13/2015   NA 137 02/13/2015  K 3.8 02/13/2015   CL 101 02/13/2015   CREATININE 1.20 02/13/2015   BUN 20 02/13/2015   CO2 23 02/13/2015   TSH 3.540 11/12/2013   INR 1.08 08/29/2011   HGBA1C 6.5* 08/29/2011    Echo 07/15/14:Study Conclusions  - Left ventricle: Posterior lateral hypokinesis. The cavity size was mildly dilated. Systolic function was mildly reduced. The estimated ejection fraction was in the range of 45% to 50%. - Mitral valve: Restricted posterior leaflet motion. There was moderate regurgitation. - Left atrium: The atrium was moderately dilated. - Right atrium: The atrium was mildly dilated. - Atrial septum: No defect or patent foramen ovale was identified. - Pulmonary arteries: PA peak pressure: 43 mm Hg (S).  Ecg July 11 showed Atrial flutter with rate 66- ventricular paced rhythm. I have personally reviewed and interpreted this study.  CT HEAD WITHOUT CONTRAST  TECHNIQUE: Contiguous axial images were obtained from the base of the skull through the vertex without intravenous contrast.  COMPARISON: 06/11/2014, prior MRI 08/05/2011  FINDINGS: Unremarkable appearance of the calvarium without acute fracture or aggressive lesion.  Unremarkable appearance of the scalp soft tissues.  Unremarkable appearance of the bilateral orbits.  Mastoid air cells are clear.  No significant paranasal sinus disease  No acute intracranial hemorrhage. No midline shift or mass effect.  Diffuse brain volume loss.  Configuration the ventricles is unchanged, with dilation that is not out of proportion to the degree of sulcal prominence. Confluent hypodensity in the periventricular white matter.  Gray-white differentiation is  maintained. Intracranial atherosclerotic calcifications.  IMPRESSION: No CT evidence of acute intracranial abnormality.  Brain volume loss with changes of chronic microvascular ischemic disease, similar to prior.  Signed,  Yvone NeuJaime S. Loreta AveWagner, DO  [facility]  [facility]   Electronically Signed  By: Gilmer MorJaime Wagner D.O.  On: 02/13/2015 21:53  CHEST 2 VIEW  COMPARISON: Chest radiograph 04/16/2014  FINDINGS: Two views of the chest demonstrate emphysematous changes of the lungs. There is no focal consolidation. There is blunting of the left costophrenic angle likely a small pleural effusion. Stable cardiac silhouette. Left pectoral pacemaker device. The osseous structures are grossly unremarkable.  IMPRESSION: Emphysema. No focal consolidation or pneumothorax.  Probable Small left pleural effusion.   Electronically Signed  By: Elgie CollardArash Radparvar M.D.  On: 02/13/2015 21:06  Assessment / Plan:  1. Episode of unresponsiveness. Etiology is unclear. Evaluation noted above. No cardiac etiology likely. CT negative and symptoms do not sound like an stroke or TIA. ?seizure versus conversion reaction. Patient has been more depressed lately. Will follow up with primary care on Monday.   2. Coronary disease with chronic occlusion of the left circumflex coronary. No significant anginal symptoms. Continue amlodipine and isosorbide.  3. History of Mobitz type I second-degree AV block. Now s/p pacemaker.  4. Congestive heart failure chronic systolic, clinically well compensated. Ejection fraction of 45-50%.  He is euvolemic.  4. Mitral insufficiency. Moderate by most recent Echo. This is of longstanding duration at least since 2001 and unchanged.  5. Hypertension.   6. Atrial flutter-rate with slow ventricular response. Pacemaker in place. On Eliquis for anti-coagulation.  7. Symptoms of weakness and fatigue. I have  recommended stopping lipitor at this point since this may be contributing to his symptoms and are not likely to prolong his life at age 81.

## 2015-02-16 NOTE — Patient Instructions (Signed)
Stop taking lipitor. You can also stop co Q10  Continue your other therapy   Keep your follow up visit with Dr. Nicholos Johnseade

## 2015-02-20 DIAGNOSIS — F324 Major depressive disorder, single episode, in partial remission: Secondary | ICD-10-CM | POA: Diagnosis not present

## 2015-03-07 ENCOUNTER — Encounter: Payer: Self-pay | Admitting: Internal Medicine

## 2015-03-07 ENCOUNTER — Ambulatory Visit (INDEPENDENT_AMBULATORY_CARE_PROVIDER_SITE_OTHER): Payer: Medicare Other | Admitting: Internal Medicine

## 2015-03-07 VITALS — BP 140/86 | HR 65 | Ht 70.5 in | Wt 185.0 lb

## 2015-03-07 DIAGNOSIS — Z45018 Encounter for adjustment and management of other part of cardiac pacemaker: Secondary | ICD-10-CM

## 2015-03-07 DIAGNOSIS — R001 Bradycardia, unspecified: Secondary | ICD-10-CM

## 2015-03-07 DIAGNOSIS — I2510 Atherosclerotic heart disease of native coronary artery without angina pectoris: Secondary | ICD-10-CM

## 2015-03-07 DIAGNOSIS — I495 Sick sinus syndrome: Secondary | ICD-10-CM

## 2015-03-07 DIAGNOSIS — I480 Paroxysmal atrial fibrillation: Secondary | ICD-10-CM | POA: Diagnosis not present

## 2015-03-07 LAB — CUP PACEART INCLINIC DEVICE CHECK
Battery Voltage: 3.02 V
Brady Statistic RV Percent Paced: 75 %
Date Time Interrogation Session: 20160802160126
Lead Channel Pacing Threshold Amplitude: 0.5 V
Lead Channel Sensing Intrinsic Amplitude: 9.9 mV
Lead Channel Setting Sensing Sensitivity: 2 mV
MDC IDC MSMT BATTERY REMAINING LONGEVITY: 126 mo
MDC IDC MSMT LEADCHNL RV IMPEDANCE VALUE: 587.5 Ohm
MDC IDC MSMT LEADCHNL RV PACING THRESHOLD PULSEWIDTH: 0.4 ms
MDC IDC PG SERIAL: 7616716
MDC IDC SET LEADCHNL RV PACING AMPLITUDE: 0.75 V
MDC IDC SET LEADCHNL RV PACING PULSEWIDTH: 0.4 ms
Pulse Gen Model: 1240

## 2015-03-07 NOTE — Patient Instructions (Addendum)
Medication Instructions:  Your physician recommends that you continue on your current medications as directed. Please refer to the Current Medication list given to you today.  Labwork: None ordered  Testing/Procedures: None ordered  Follow-Up: Remote monitoring is used to monitor your Pacemaker of ICD from home. This monitoring reduces the number of office visits required to check your device to one time per year. It allows [place] to keep an eye on the functioning of your device to ensure it is working properly. You are scheduled for a device check from home on 06/06/15. You may send your transmission at any time that day. If you have a wireless device, the transmission will be sent automatically. After your physician reviews your transmission, you will receive a postcard with your next transmission date.  Your physician wants you to follow-up in: 1 year with Dr. Graciela Husbands.  You will receive a reminder letter in the mail two months in advance. If you don't receive a letter, please call our office to schedule the follow-up appointment.  Any Other Special Instructions Will Be Listed Below (If Applicable). Thank you for choosing [facility]!!

## 2015-03-07 NOTE — Progress Notes (Signed)
Patient Care Team: Elias Else, MD as PCP - General (Family Medicine)   HPI  Craig [name] is a 81 y.o. male Seen in follow-up for pacemaker implanted 8/15 for symptomatic bradycardia.  He has persistent atrial fibrillation. Reviewing electrocardiograms 12/13 was the last time he was in sinus rhythm. He has complaints of exercise intolerance he takes anticoagulation  He was recently seen in the ER with an episode of unresponsiveness and office follow up thereafter with Dr [place], illuminating for passive death wish in the context of his age and poor quality of life     Echocardiogram 3/14 demonstrated moderate LV dysfunction 40-45% with moderate-severe MR and severe LAE.      Past Medical History  Diagnosis Date  . Mitral insufficiency     chronically moderate to severe  . Hypertension   . Hyperlipidemia   . 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 Coronary artery disease     a. Remote lateral infarct;  b. 09/2011 MV: large dense inferolat/lat scar - ? very slight peri-infarcrt isch.  No signif change c/w 09/2010 scan.  . Raynaud's disease /phenomenon   . Hard of hearing     bilaterally  . Gout   . PTSD (post-traumatic stress disorder)     "being treated for it now but not a big degree"  . Asthma   . Noncompliance with medication regimen   . Chronic systolic CHF (congestive heart failure)     a. 10/2011 Echo: EF 40-45%, inf AK, PASP .  Marland Kitchen Persistent atrial fibrillation     Past Surgical History  Procedure Laterality Date  . Prostatectomy    . Tonsillectomy    . Inguinal hernia repair      left  . Appendectomy    . Cardiac catheterization  2003    SHOWED OCCLUSION OF THE LEFT CIRCUMFLEX CORONARY ARTERY  . Pacemaker insertion  03/28/14    STJ single chamber pacemaker implanted by Dr Graciela Husbands for symptomatic bradycardia  . Left heart catheterization with coronary angiogram N/A 08/29/2011    Procedure: LEFT HEART CATHETERIZATION WITH CORONARY ANGIOGRAM;   Surgeon: Kathleene Hazel, MD;  Location: [facility] CATH LAB;  Service: Cardiovascular;  Laterality: N/A;  . Permanent pacemaker insertion N/A 03/28/2014    Procedure: PERMANENT PACEMAKER INSERTION;  Surgeon: Duke Salvia, MD;  Location: [facility] CATH LAB;  Service: Cardiovascular;  Laterality: N/A;    Current Outpatient Prescriptions  Medication Sig Dispense Refill  . acetaminophen (TYLENOL) 500 MG tablet Take 500 mg by mouth every 6 (six) hours as needed for moderate pain.    Marland Kitchen allopurinol (ZYLOPRIM) 300 MG tablet Take 450 mg by mouth daily.     Marland Kitchen amLODipine (NORVASC) 5 MG tablet Take 5 mg by mouth daily.    Marland Kitchen apixaban (ELIQUIS) 2.5 MG TABS tablet Take 1 tablet (2.5 mg total) by mouth 2 (two) times daily. 60 tablet 5  . Cholecalciferol (VITAMIN D-3) 1000 UNITS CAPS Take 1,000 Units by mouth 2 (two) times daily.     . finasteride (PROSCAR) 5 MG tablet Take 5 mg by mouth every evening.     . furosemide (LASIX) 20 MG tablet Take 2 tablets (40 mg total) by mouth daily. 60 tablet 2  . isosorbide mononitrate (IMDUR) 60 MG 24 hr tablet Take 1 tablet (60 mg total) by mouth daily. 30 tablet 11  . levothyroxine (SYNTHROID, LEVOTHROID) 50 MCG tablet Take 50 mcg by mouth daily before breakfast.    .  losartan (COZAAR) 50 MG tablet Take 1 tablet (50 mg total) by mouth daily. 30 tablet 6  . nitroGLYCERIN (NITROSTAT) 0.4 MG SL tablet Place 1 tablet (0.4 mg total) under the tongue every 5 (five) minutes as needed. x3 doses as needed for chest pain 25 tablet 3  . omeprazole (PRILOSEC) 20 MG capsule Take 40 mg by mouth daily.     Marland Kitchen PARoxetine (PAXIL) 10 MG tablet Take 10 mg by mouth every evening.    . ranitidine (ZANTAC) 300 MG capsule Take 300 mg by mouth every evening.     No current facility-administered medications for this visit.    Allergies  Allergen Reactions  . Zocor [Simvastatin] Other (See Comments)    Unknown reaction per pt    Review of Systems negative except from HPI and PMH  Physical  Exam BP 140/86 mmHg  Pulse 65  Ht 5' 10.5" (1.791 m)  Wt 185 lb (83.915 kg)  BMI 26.16 kg/m2 Well developed and well nourished in no acute distress HENT normal E scleral and icterus clear Neck Supple JVP flat; carotids brisk and full Clear to ausculation Device pocket well healed; without hematoma or erythema.  There is no tethering Regular rate and rhythm, no murmurs gallops or rub Soft with active bowel sounds No clubbing cyanosis  Edema Alert and oriented, grossly normal motor and sensory function Skin Warm and Dry   ECG demonstrates atrial flutter at 60 variable conduction Intervals-/07/38 Axis left-50   Assessment and  Pl  Sinus node dysfunction  Pacemaker-St. Jude  First-degree AV block and second-degree AV block  LV dysfunction/valvular cardiomyopathy  Mitral regurgitation-moderate-severe  The patient has progressive shortness of breath. I suspect he has been worsening mitral regurgitation and/or LV dysfunction. I've encouraged him to do when he wants to do.  There are some episodes of atrial flutter on ECG in the chart; currently he is in sinus rhythm with variable conduction. Pacemaker function is normal.  We will plan to have him get an echo and stress discussed with Dr. Garth Bigness as to whether he also can be done for him a MR point of view. He appears euvolemic son will not adjust his diuretics today.

## 2015-03-14 DIAGNOSIS — N400 Enlarged prostate without lower urinary tract symptoms: Secondary | ICD-10-CM | POA: Diagnosis not present

## 2015-04-06 DIAGNOSIS — Z0000 Encounter for general adult medical examination without abnormal findings: Secondary | ICD-10-CM | POA: Diagnosis not present

## 2015-04-06 DIAGNOSIS — I110 Hypertensive heart disease with heart failure: Secondary | ICD-10-CM | POA: Diagnosis not present

## 2015-04-06 DIAGNOSIS — Z23 Encounter for immunization: Secondary | ICD-10-CM | POA: Diagnosis not present

## 2015-04-17 ENCOUNTER — Other Ambulatory Visit: Payer: Self-pay | Admitting: Cardiology

## 2015-04-17 MED ORDER — ATORVASTATIN CALCIUM 10 MG PO TABS
10.0000 mg | ORAL_TABLET | Freq: Every day | ORAL | Status: DC
Start: 2015-04-17 — End: 2015-05-16

## 2015-05-16 ENCOUNTER — Encounter: Payer: Self-pay | Admitting: Cardiology

## 2015-05-16 ENCOUNTER — Ambulatory Visit (INDEPENDENT_AMBULATORY_CARE_PROVIDER_SITE_OTHER): Payer: Medicare Other | Admitting: Cardiology

## 2015-05-16 ENCOUNTER — Telehealth: Payer: Self-pay | Admitting: Cardiology

## 2015-05-16 VITALS — BP 126/66 | HR 65 | Ht 70.5 in | Wt 181.0 lb

## 2015-05-16 DIAGNOSIS — I5022 Chronic systolic (congestive) heart failure: Secondary | ICD-10-CM

## 2015-05-16 DIAGNOSIS — I483 Typical atrial flutter: Secondary | ICD-10-CM | POA: Diagnosis not present

## 2015-05-16 DIAGNOSIS — I25119 Atherosclerotic heart disease of native coronary artery with unspecified angina pectoris: Secondary | ICD-10-CM | POA: Diagnosis not present

## 2015-05-16 DIAGNOSIS — I441 Atrioventricular block, second degree: Secondary | ICD-10-CM

## 2015-05-16 DIAGNOSIS — I10 Essential (primary) hypertension: Secondary | ICD-10-CM

## 2015-05-16 NOTE — Telephone Encounter (Signed)
Returned call to patient's daughter.Appointment scheduled with Dr.Jordan this afternoon at 3:00 pm.

## 2015-05-16 NOTE — Patient Instructions (Signed)
Continue your current therapy  I will see you in 6 months.   

## 2015-05-16 NOTE — Progress Notes (Signed)
Craig [name] Date of Birth: Feb 21, 1919 Medical Record #[id number]  History of Present Illness: Craig [name] is seen as a work in for chest pain.He has a history of CAD with chronic occlusion of the LCx- last cardiac cath in 2003. He also has a history of CHF with EF of 45-50%- by Echo December 2015. He has moderate  MR managed medically. He has a history of atrial flutter and is on Eliquis. He has a history of Mobitz type 1 AV block when in NSR. Prior Holter monitor in 9/13 showed AV block without indication for pacemaker.  In May he wore an event monitor  showing atrial flutter with slow ventricular response as low as 40 bpm. He underwent pacemaker implant in August 2015.  On July 11 he presented to the ED after his daughter found him unresponsive at home. He was just staring into space. He couldn't speak or move. EMS was called and initial vitals and BS were normal. He gradually came out of it and by the time he got to the ED he was responsive without deficit. Labs, Ecg, CXR, and head CT were unremarkable.  He is seen today with his 2 daughters. Over the last 3 days he has complained of chest pain. It comes and goes. It is sometimes on the right side and sometimes on the left. No clear aggravating factors. Doesn't take anything for it. Daughters note he pokes at his pacemaker.   He still complains that he has no energy and feels no strength. He states he is not able to do the things he likes to do. On his last visit we stopped his lipitor without improvement. Dr. Nicholos Johns tried him on Paxil but this resulted in more confusion and lack of appetite and was discontinued.   Current Outpatient Prescriptions on File Prior to Visit  Medication Sig Dispense Refill  . acetaminophen (TYLENOL) 500 MG tablet Take 500 mg by mouth every 6 (six) hours as needed for moderate pain.    Marland Kitchen allopurinol (ZYLOPRIM) 300 MG tablet Take 450 mg by mouth daily.     Marland Kitchen amLODipine (NORVASC) 5 MG tablet Take 5 mg by mouth daily.     Marland Kitchen apixaban (ELIQUIS) 2.5 MG TABS tablet Take 1 tablet (2.5 mg total) by mouth 2 (two) times daily. 60 tablet 5  . Cholecalciferol (VITAMIN D-3) 1000 UNITS CAPS Take 1,000 Units by mouth 2 (two) times daily.     . finasteride (PROSCAR) 5 MG tablet Take 5 mg by mouth every evening.     . furosemide (LASIX) 20 MG tablet Take 2 tablets (40 mg total) by mouth daily. 60 tablet 2  . isosorbide mononitrate (IMDUR) 60 MG 24 hr tablet Take 1 tablet (60 mg total) by mouth daily. 30 tablet 11  . levothyroxine (SYNTHROID, LEVOTHROID) 50 MCG tablet Take 50 mcg by mouth daily before breakfast.    . losartan (COZAAR) 50 MG tablet Take 1 tablet (50 mg total) by mouth daily. 30 tablet 6  . nitroGLYCERIN (NITROSTAT) 0.4 MG SL tablet Place 1 tablet (0.4 mg total) under the tongue every 5 (five) minutes as needed. x3 doses as needed for chest pain 25 tablet 3  . omeprazole (PRILOSEC) 20 MG capsule Take 40 mg by mouth daily.     . ranitidine (ZANTAC) 300 MG capsule Take 300 mg by mouth every evening.     No current facility-administered medications on file prior to visit.    Allergies  Allergen Reactions  . Zocor [Simvastatin] Other (  See Comments)    Unknown reaction per pt    Past Medical History  Diagnosis Date  . Mitral insufficiency     chronically moderate to severe  . Hypertension   . Hyperlipidemia   . 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 Coronary artery disease     a. Remote lateral infarct;  b. 09/2011 MV: large dense inferolat/lat scar - ? very slight peri-infarcrt isch.  No signif change c/w 09/2010 scan.  . Raynaud's disease /phenomenon   . Hard of hearing     bilaterally  . Gout   . PTSD (post-traumatic stress disorder)     "being treated for it now but not a big degree"  . Asthma   . Noncompliance with medication regimen   . Chronic systolic CHF (congestive heart failure) (HCC)     a. 10/2011 Echo: EF 40-45%, inf AK, PASP .  Marland Kitchen Persistent atrial fibrillation [facility])     Past  Surgical History  Procedure Laterality Date  . Prostatectomy    . Tonsillectomy    . Inguinal hernia repair      left  . Appendectomy    . Cardiac catheterization  2003    SHOWED OCCLUSION OF THE LEFT CIRCUMFLEX CORONARY ARTERY  . Pacemaker insertion  03/28/14    STJ single chamber pacemaker implanted by Dr Graciela Husbands for symptomatic bradycardia  . Left heart catheterization with coronary angiogram N/A 08/29/2011    Procedure: LEFT HEART CATHETERIZATION WITH CORONARY ANGIOGRAM;  Surgeon: Kathleene Hazel, MD;  Location: [facility] CATH LAB;  Service: Cardiovascular;  Laterality: N/A;  . Permanent pacemaker insertion N/A 03/28/2014    Procedure: PERMANENT PACEMAKER INSERTION;  Surgeon: Duke Salvia, MD;  Location: [facility] CATH LAB;  Service: Cardiovascular;  Laterality: N/A;    History  Smoking status  . Former Smoker -- 1.50 packs/day for 20 years  . Quit date: 12/12/1970  Smokeless tobacco  . Never Used    History  Alcohol Use No    Family History  Problem Relation Age of Onset  . Heart attack Brother   . Alcohol abuse Father     died in his late 28's  . Other Mother     died in her late 43's - old age.    Review of Systems: As noted in history of present illness  All other systems were reviewed and are negative.  Physical Exam: BP 126/66 mmHg  Pulse 65  Ht 5' 10.5" (1.791 m)  Wt 82.101 kg (181 lb)  BMI 25.60 kg/m2 He is an elderly white male, pleasant, in no distress. He is balding. He wears a hearing aid. Pupils are equal round and reactive. Sclera are clear. Oropharynx is clear. Neck is without JVD, adenopathy, thyromegaly, or bruits. Lungs are clear to auscultation and percussion. Cardiac exam reveals a regular rate and rhythm with a grade 2/6 systolic murmur at the apex. No chest wall tenderness to palpation. Pacer site looks fine. Abdomen is soft and nontender without organomegaly or masses. Extremities are without edema or cyanosis. Pulses are 2+. Skin is warm and  dry. Neurologic exam reveals no focal abnormalities. Mood is appropriate and he even laughed a bit.   LABORATORY DATA:   Lab Results  Component Value Date   WBC 9.3 02/13/2015   HGB 15.0 02/13/2015   HCT 44.0 02/13/2015   PLT 176 02/13/2015   GLUCOSE 87 02/13/2015   CHOL 131 08/29/2011   TRIG 75 08/29/2011   HDL 46  08/29/2011   LDLCALC 70 08/29/2011   ALT 35 02/13/2015   AST 29 02/13/2015   NA 137 02/13/2015   K 3.8 02/13/2015   CL 101 02/13/2015   CREATININE 1.20 02/13/2015   BUN 20 02/13/2015   CO2 23 02/13/2015   TSH 3.540 11/12/2013   INR 1.08 08/29/2011   HGBA1C 6.5* 08/29/2011    Echo 07/15/14:Study Conclusions  - Left ventricle: Posterior lateral hypokinesis. The cavity size was mildly dilated. Systolic function was mildly reduced. The estimated ejection fraction was in the range of 45% to 50%. - Mitral valve: Restricted posterior leaflet motion. There was moderate regurgitation. - Left atrium: The atrium was moderately dilated. - Right atrium: The atrium was mildly dilated. - Atrial septum: No defect or patent foramen ovale was identified. - Pulmonary arteries: PA peak pressure: 43 mm Hg (S).  Ecg today shows atrial flutter with ventricular paced rhythm. Rate 65. I have personally reviewed and interpreted this study.  CT HEAD WITHOUT CONTRAST  TECHNIQUE: Contiguous axial images were obtained from the base of the skull through the vertex without intravenous contrast.  COMPARISON: 06/11/2014, prior MRI 08/05/2011  FINDINGS: Unremarkable appearance of the calvarium without acute fracture or aggressive lesion.  Unremarkable appearance of the scalp soft tissues.  Unremarkable appearance of the bilateral orbits.  Mastoid air cells are clear.  No significant paranasal sinus disease  No acute intracranial hemorrhage. No midline shift or mass effect.  Diffuse brain volume loss.  Configuration the ventricles is unchanged, with  dilation that is not out of proportion to the degree of sulcal prominence. Confluent hypodensity in the periventricular white matter.  Gray-white differentiation is maintained. Intracranial atherosclerotic calcifications.  IMPRESSION: No CT evidence of acute intracranial abnormality.  Brain volume loss with changes of chronic microvascular ischemic disease, similar to prior.  Signed,  Yvone Neu. Loreta Ave, DO  [facility]  [facility] Radiology   Electronically Signed  By: Gilmer Mor D.O.  On: 02/13/2015 21:53  CHEST 2 VIEW  COMPARISON: Chest radiograph 04/16/2014  FINDINGS: Two views of the chest demonstrate emphysematous changes of the lungs. There is no focal consolidation. There is blunting of the left costophrenic angle likely a small pleural effusion. Stable cardiac silhouette. Left pectoral pacemaker device. The osseous structures are grossly unremarkable.  IMPRESSION: Emphysema. No focal consolidation or pneumothorax.  Probable Small left pleural effusion.   Electronically Signed  By: Elgie Collard M.D.  On: 02/13/2015 21:06  Assessment / Plan:  1. Chest pain atypical. Doubt cardiac related. BP well controlled. Rate is good. No increase in CHF. Reassured all. Will continue current therapy/   2. Coronary disease with chronic occlusion of the left circumflex coronary. No significant anginal symptoms. Continue amlodipine and isosorbide.  3. History of Mobitz type I second-degree AV block. Now s/p pacemaker.  4. Congestive heart failure chronic systolic, clinically well compensated. Ejection fraction of 45-50%.  He is euvolemic.  4. Mitral insufficiency. Moderate by most recent Echo. This is of longstanding duration at least since 2001 and unchanged.  5. Hypertension.   6. Atrial flutter-rate with slow ventricular response. Pacemaker in place. On Eliquis for anti-coagulation.  7. Symptoms  of weakness and fatigue. I explained that this is related to his underlying heart condition, medications, and advanced age. Needs to continue same therapy. Encouraged developing a hobby or [facility].

## 2015-05-16 NOTE — Telephone Encounter (Signed)
Returned call to patient's home. Spoke w/ daughter, she states pt has been complaining of pain off and on in chest. She thinks is related to him "picking at" his pacemaker site. No shortness of breath, no other symptoms - has been having CP intermittently and resolves.  She wanted to see if he could be eval'd. She states open availability, she can bring him in any day/time suitable. I informed her I would reach out to scheduling to get options - may be good idea to have pt seen by PA or NP. Advised ER if symptoms worsen.  Called Lupita Leash at [facility], left message requesting call back to have pt scheduled.

## 2015-05-16 NOTE — Telephone Encounter (Signed)
I can see this afternoon  Peter Swaziland MD, [facility]

## 2015-05-16 NOTE — Telephone Encounter (Signed)
Pt c/o of Chest Pain: STAT if CP now or developed within 24 hours  1. Are you having CP right now? NO   2. Are you experiencing any other symptoms (ex. SOB, nausea, vomiting, sweating)? NO  3. How long have you been experiencing CP? Not sure   4. Is your CP continuous or coming and going? Yes  5. Have you taken Nitroglycerin? No ?

## 2015-05-31 ENCOUNTER — Other Ambulatory Visit: Payer: Self-pay | Admitting: *Deleted

## 2015-05-31 MED ORDER — ISOSORBIDE MONONITRATE ER 60 MG PO TB24: 60.0000 mg | ORAL_TABLET | Freq: Every day | ORAL | Status: DC

## 2015-06-06 ENCOUNTER — Ambulatory Visit (INDEPENDENT_AMBULATORY_CARE_PROVIDER_SITE_OTHER): Payer: Medicare Other | Admitting: *Deleted

## 2015-06-06 DIAGNOSIS — I441 Atrioventricular block, second degree: Secondary | ICD-10-CM | POA: Diagnosis not present

## 2015-06-07 NOTE — Progress Notes (Signed)
Remote pacemaker transmission.   

## 2015-06-19 ENCOUNTER — Emergency Department (HOSPITAL_COMMUNITY): Payer: Medicare Other

## 2015-06-19 ENCOUNTER — Emergency Department (HOSPITAL_COMMUNITY)
Admission: EM | Admit: 2015-06-19 | Discharge: 2015-06-19 | Disposition: A | Discharge status: Home / Self Care | Payer: Medicare Other | Attending: Emergency Medicine | Admitting: Emergency Medicine

## 2015-06-19 ENCOUNTER — Encounter (HOSPITAL_COMMUNITY): Payer: Self-pay | Admitting: *Deleted

## 2015-06-19 DIAGNOSIS — Z9119 Patient's noncompliance with other medical treatment and regimen: Secondary | ICD-10-CM | POA: Insufficient documentation

## 2015-06-19 DIAGNOSIS — Z79899 Other long term (current) drug therapy: Secondary | ICD-10-CM | POA: Diagnosis not present

## 2015-06-19 DIAGNOSIS — J45909 Unspecified asthma, uncomplicated: Secondary | ICD-10-CM | POA: Diagnosis not present

## 2015-06-19 DIAGNOSIS — I2510 Atherosclerotic heart disease of native coronary artery without angina pectoris: Secondary | ICD-10-CM | POA: Insufficient documentation

## 2015-06-19 DIAGNOSIS — R531 Weakness: Secondary | ICD-10-CM | POA: Insufficient documentation

## 2015-06-19 DIAGNOSIS — E785 Hyperlipidemia, unspecified: Secondary | ICD-10-CM | POA: Insufficient documentation

## 2015-06-19 DIAGNOSIS — E860 Dehydration: Secondary | ICD-10-CM

## 2015-06-19 DIAGNOSIS — R079 Chest pain, unspecified: Secondary | ICD-10-CM | POA: Insufficient documentation

## 2015-06-19 DIAGNOSIS — I10 Essential (primary) hypertension: Secondary | ICD-10-CM | POA: Insufficient documentation

## 2015-06-19 DIAGNOSIS — Z87891 Personal history of nicotine dependence: Secondary | ICD-10-CM | POA: Insufficient documentation

## 2015-06-19 DIAGNOSIS — I5022 Chronic systolic (congestive) heart failure: Secondary | ICD-10-CM | POA: Insufficient documentation

## 2015-06-19 DIAGNOSIS — R0789 Other chest pain: Secondary | ICD-10-CM | POA: Diagnosis not present

## 2015-06-19 LAB — CBC
HCT: 37.8 % — ABNORMAL LOW (ref 39.0–52.0)
Hemoglobin: 12.8 g/dL — ABNORMAL LOW (ref 13.0–17.0)
MCH: 28.1 pg (ref 26.0–34.0)
MCHC: 33.9 g/dL (ref 30.0–36.0)
MCV: 83.1 fL (ref 78.0–100.0)
Platelets: 242 10*3/uL (ref 150–400)
RBC: 4.55 MIL/uL (ref 4.22–5.81)
RDW: 15.9 % — ABNORMAL HIGH (ref 11.5–15.5)
WBC: 9 10*3/uL (ref 4.0–10.5)

## 2015-06-19 LAB — BASIC METABOLIC PANEL
Anion gap: 11 (ref 5–15)
BUN: 22 mg/dL — AB (ref 6–20)
CHLORIDE: 101 mmol/L (ref 101–111)
CO2: 24 mmol/L (ref 22–32)
Calcium: 9.6 mg/dL (ref 8.9–10.3)
Creatinine, Ser: 1.38 mg/dL — ABNORMAL HIGH (ref 0.61–1.24)
GFR calc non Af Amer: 42 mL/min — ABNORMAL LOW (ref >60)
GFR, EST AFRICAN AMERICAN: 49 mL/min — AB (ref >60)
Glucose, Bld: 119 mg/dL — ABNORMAL HIGH (ref 65–99)
Potassium: 4.4 mmol/L (ref 3.5–5.1)
Sodium: 136 mmol/L (ref 135–145)

## 2015-06-19 LAB — I-STAT TROPONIN, ED: Troponin i, poc: 0.05 ng/mL (ref 0.00–0.08)

## 2015-06-19 NOTE — ED Notes (Signed)
Reports intermittent chest pains. Denies n/v or sob. States "he just feels funny and weak all over."

## 2015-06-19 NOTE — ED Provider Notes (Signed)
CSN: [id number]     Arrival date & time 06/19/15  1715 History   First MD Initiated Contact with Patient 06/19/15 1948     Chief Complaint  Patient presents with  . Chest Pain     (Consider location/radiation/quality/duration/timing/severity/associated sxs/prior Treatment) The history is provided by the patient.     Craig [name] is a 81 y.o. male presents for evaluation of a feeling of weakness, whole body for several days. He also had 3-4 hours of chest pain, earlier today, which occurred without provocation, while at rest. He had a cough with "congestion" last week but that has resolved. He has had decreased appetite this week. There is been no fever, chills, diarrhea, headache or back pain. He is taking his usual medications. There are no other known modifying factors.   Past Medical History  Diagnosis Date  . Mitral insufficiency     chronically moderate to severe  . Hypertension   . Hyperlipidemia   . 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 Coronary artery disease     a. Remote lateral infarct;  b. 09/2011 MV: large dense inferolat/lat scar - ? very slight peri-infarcrt isch.  No signif change c/w 09/2010 scan.  . Raynaud's disease /phenomenon   . Hard of hearing     bilaterally  . Gout   . PTSD (post-traumatic stress disorder)     "being treated for it now but not a big degree"  . Asthma   . Noncompliance with medication regimen   . Chronic systolic CHF (congestive heart failure) (HCC)     a. 10/2011 Echo: EF 40-45%, inf AK, PASP .  Marland Kitchen Persistent atrial fibrillation [facility])    Past Surgical History  Procedure Laterality Date  . Prostatectomy    . Tonsillectomy    . Inguinal hernia repair      left  . Appendectomy    . Cardiac catheterization  2003    SHOWED OCCLUSION OF THE LEFT CIRCUMFLEX CORONARY ARTERY  . Pacemaker insertion  03/28/14    STJ single chamber pacemaker implanted by Dr Graciela Husbands for symptomatic bradycardia  . Left heart catheterization with  coronary angiogram N/A 08/29/2011    Procedure: LEFT HEART CATHETERIZATION WITH CORONARY ANGIOGRAM;  Surgeon: Kathleene Hazel, MD;  Location: [facility] CATH LAB;  Service: Cardiovascular;  Laterality: N/A;  . Permanent pacemaker insertion N/A 03/28/2014    Procedure: PERMANENT PACEMAKER INSERTION;  Surgeon: Duke Salvia, MD;  Location: [facility] CATH LAB;  Service: Cardiovascular;  Laterality: N/A;   Family History  Problem Relation Age of Onset  . Heart attack Brother   . Alcohol abuse Father     died in his late 54's  . Other Mother     died in her late 82's - old age.   Social History  Substance Use Topics  . Smoking status: Former Smoker -- 1.50 packs/day for 20 years    Quit date: 12/12/1970  . Smokeless tobacco: Never Used  . Alcohol Use: No    Review of Systems  All other systems reviewed and are negative.     Allergies  Zocor  Home Medications   Prior to Admission medications   Medication Sig Start Date End Date Taking? Authorizing Provider  acetaminophen (TYLENOL) 500 MG tablet Take 500 mg by mouth every 6 (six) hours as needed for moderate pain.    Historical Provider, MD  allopurinol (ZYLOPRIM) 300 MG tablet Take 450 mg by mouth daily.  Historical Provider, MD  amLODipine (NORVASC) 5 MG tablet Take 5 mg by mouth daily.    Historical Provider, MD  apixaban (ELIQUIS) 2.5 MG TABS tablet Take 1 tablet (2.5 mg total) by mouth 2 (two) times daily. 08/12/14   Peter M SwazilandJordan, MD  Cholecalciferol (VITAMIN D-3) 1000 UNITS CAPS Take 1,000 Units by mouth 2 (two) times daily.     Historical Provider, MD  finasteride (PROSCAR) 5 MG tablet Take 5 mg by mouth every evening.     Historical Provider, MD  furosemide (LASIX) 20 MG tablet Take 2 tablets (40 mg total) by mouth daily. 01/16/15   Peter M SwazilandJordan, MD  isosorbide mononitrate (IMDUR) 60 MG 24 hr tablet Take 1 tablet (60 mg total) by mouth daily. 05/31/15   Peter M SwazilandJordan, MD  levothyroxine (SYNTHROID, LEVOTHROID) 50 MCG tablet Take  50 mcg by mouth daily before breakfast.    Historical Provider, MD  losartan (COZAAR) 50 MG tablet Take 1 tablet (50 mg total) by mouth daily. 05/23/14   Peter M SwazilandJordan, MD  nitroGLYCERIN (NITROSTAT) 0.4 MG SL tablet Place 1 tablet (0.4 mg total) under the tongue every 5 (five) minutes as needed. x3 doses as needed for chest pain 10/18/12   Ok Anishristopher R Berge, NP  omeprazole (PRILOSEC) 20 MG capsule Take 40 mg by mouth daily.     Historical Provider, MD  ranitidine (ZANTAC) 300 MG capsule Take 300 mg by mouth every evening.    Historical Provider, MD   BP 137/81 mmHg  Pulse 63  Temp(Src) 97.6 F (36.4 C) (Oral)  Resp 19  SpO2 100% Physical Exam  Constitutional: He is oriented to person, place, and time. He appears well-developed and well-nourished.  HENT:  Head: Normocephalic and atraumatic.  Right Ear: External ear normal.  Left Ear: External ear normal.  Eyes: Conjunctivae and EOM are normal. Pupils are equal, round, and reactive to light.  Neck: Normal range of motion and phonation normal. Neck supple.  Cardiovascular: Normal rate, regular rhythm and normal heart sounds.   Pulmonary/Chest: Effort normal and breath sounds normal. He exhibits no bony tenderness.  Abdominal: Soft. There is no tenderness.  Musculoskeletal: Normal range of motion.  Neurological: He is alert and oriented to person, place, and time. No cranial nerve deficit or sensory deficit. He exhibits normal muscle tone. Coordination normal.  Somewhat poor historian. No dysarthria and aphasia or nystagmus.  Skin: Skin is warm, dry and intact.  Psychiatric: He has a normal mood and affect. His behavior is normal.  Nursing note and vitals reviewed.   ED Course  Procedures (including critical care time)  Medications - No data to display  Patient Vitals for the past 24 hrs:  BP Temp Temp src Pulse Resp SpO2  06/19/15 1948 137/81 mmHg 97.6 F (36.4 C) Oral 63 19 100 %  06/19/15 1743 122/65 mmHg 97.6 F (36.4 C)  Oral 71 18 99 %    8:22 PM Reevaluation with update and discussion. After initial assessment and treatment, an updated evaluation reveals patient comfortable. Findings discussed with patient, and daughter, who lives with him. All questions were answered.Mancel Bale. Derald Lorge L    Labs Review Labs Reviewed  BASIC METABOLIC PANEL - Abnormal; Notable for the following:    Glucose, Bld 119 (*)    BUN 22 (*)    Creatinine, Ser 1.38 (*)    GFR calc non Af Amer 42 (*)    GFR calc Af Amer 49 (*)    All other components within  normal limits  CBC - Abnormal; Notable for the following:    Hemoglobin 12.8 (*)    HCT 37.8 (*)    RDW 15.9 (*)    All other components within normal limits  I-STAT TROPOININ, ED    Imaging Review Dg Chest 2 View  06/19/2015  CLINICAL DATA:  Intermittent chest pain today, weakness, HTN, nonsmoker. Hx asthma, CHF, Afib, cardiac cath, pacemaker. EXAM: CHEST  2 VIEW COMPARISON:  02/13/2015 FINDINGS: Heart size is upper normal. Left-sided transvenous pacemaker leads to the right ventricle. Lungs are free of focal consolidations and pleural effusions. Bones appear sclerotic. IMPRESSION: No active cardiopulmonary disease. Electronically Signed   By: Norva Pavlov M.D.   On: 06/19/2015 18:55   I have personally reviewed and evaluated these images and lab results as part of my medical decision-making.   EKG Interpretation   Date/Time:  Monday June 19 2015 17:32:58 EST Ventricular Rate:  63 PR Interval:    QRS Duration: 166 QT Interval:  492 QTC Calculation: 503 R Axis:   -75 Text Interpretation:  Electronic ventricular pacemaker since last tracing  no significant change Confirmed by Jhada Risk  MD, Dusten Ellinwood ([id number]) on  06/19/2015 7:50:50 PM      MDM   Final diagnoses:  Dehydration  Weakness  Nonspecific chest pain    Nonspecific weakness. Doubt ACS, PE or pneumonia. Hemoglobin somewhat low, based on last lab, but the prior lab was in a similar range. Doubt  significant blood loss anemia. Suspect mild dehydration or volume depletion secondary to decreased oral intake. He has a mildly bumped creatinine. Doubt severe hypotension/dehydration.  Nursing Notes Reviewed/ Care Coordinated Applicable Imaging Reviewed Interpretation of Laboratory Data incorporated into ED treatment  The patient appears reasonably screened and/or stabilized for discharge and I doubt any other medical condition or other [facility] requiring further screening, evaluation, or treatment in the ED at this time prior to discharge.  Plan: Home Medications- usual; Home Treatments- increase oral fluids; return here if the recommended treatment, does not improve the symptoms; Recommended follow up- PCP prn     Mancel Bale, MD 06/20/15 0006

## 2015-06-19 NOTE — Discharge Instructions (Signed)
Try to drink an extra 2 or 3 glasses of water each day. Eat 3 meals each day.   Dehydration Dehydration is when you lose more fluids from the body than you take in. Vital organs such as the kidneys, brain, and heart cannot function without a proper amount of fluids and salt. Any loss of fluids from the body can cause dehydration.  Older adults are at a higher risk of dehydration than younger adults. As we age, our bodies are less able to conserve water and do not respond to temperature changes as well. Also, older adults do not become thirsty as easily or quickly. Because of this, older adults often do not realize they need to increase fluids to avoid dehydration.  CAUSES   Vomiting.  Diarrhea.  Excessive sweating.  Excessive urination.  Fever.  Certain medicines, such as blood pressure medicines called diuretics.  Poorly controlled blood sugars. SIGNS AND SYMPTOMS  Mild dehydration:  Thirst.  Dry lips.  Slightly dry mouth. Moderate dehydration:  Very dry mouth.  Sunken eyes.  Skin does not bounce back quickly when lightly pinched and released.  Dark urine and decreased urine production.  Decreased tear production.  Headache. Severe dehydration:  Very dry mouth.  Extreme thirst.  Rapid, weak pulse (more than 100 beats per minute at rest).  Cold hands and feet.  Not able to sweat in spite of heat.  Rapid breathing.  Blue lips.  Confusion and lethargy.  Difficulty being awakened.  Minimal urine production.  No tears. DIAGNOSIS  Your health care provider will diagnose dehydration based on your symptoms and your exam. Blood and urine tests will help confirm the diagnosis. The diagnostic evaluation should also identify the cause of dehydration. TREATMENT  Treatment of mild or moderate dehydration can often be done at home by increasing the amount of fluids that you drink. It is best to drink small amounts of fluid more often. Drinking too much at one  time can make vomiting worse. Severe dehydration needs to be treated at the hospital. You may be given IV fluids that contain water and electrolytes. HOME CARE INSTRUCTIONS   Ask your health care provider about specific rehydration instructions.  Drink enough fluids to keep your urine clear or pale yellow.  Drink small amounts frequently if you have nausea and vomiting.  Eat as you normally do.  Avoid:  Foods or drinks high in sugar.  Carbonated drinks.  Juice.  Extremely hot or cold fluids.  Drinks with caffeine.  Fatty, greasy foods.  Alcohol.  Tobacco.  Overeating.  Gelatin desserts.  Wash your hands well to avoid spreading bacteria and viruses.  Only take over-the-counter or prescription medicines for pain, discomfort, or fever as directed by your health care provider.  Ask your health care provider if you should continue all prescribed and over-the-counter medicines.  Keep all follow-up appointments with your health care provider. SEEK MEDICAL CARE IF:  You have abdominal pain, and it increases or stays in one area (localizes).  You have a rash, stiff neck, or severe headache.  You are irritable, sleepy, or difficult to awaken.  You are weak, dizzy, or extremely thirsty.  You have a fever. SEEK IMMEDIATE MEDICAL CARE IF:   You are unable to keep fluids down, or you get worse despite treatment.  You have frequent episodes of vomiting or diarrhea.  You have blood or green matter (bile) in your vomit.  You have blood in your stool, or your stool looks black and tarry.  You have not urinated in 6-8 hours, or you have only urinated a small amount of very dark urine.  You faint. MAKE SURE YOU:   Understand these instructions.  Will watch your condition.  Will get help right away if you are not doing well or get worse.   This information is not intended to replace advice given to you by your health care provider. Make sure you discuss any  questions you have with your health care provider.   Document Released: 10/12/2003 Document Revised: 07/27/2013 Document Reviewed: 03/29/2013 Elsevier Interactive Patient Education 2016 [employer] Inc[employer]  Nonspecific Chest Pain  Chest pain can be caused by many different conditions. There is always a chance that your pain could be related to something serious, such as a heart attack or a blood clot in your lungs. Chest pain can also be caused by conditions that are not life-threatening. If you have chest pain, it is very important to follow up with your health care provider. CAUSES  Chest pain can be caused by:  Heartburn.  Pneumonia or bronchitis.  Anxiety or stress.  Inflammation around your heart (pericarditis) or lung (pleuritis or pleurisy).  A blood clot in your lung.  A collapsed lung (pneumothorax). It can develop suddenly on its own (spontaneous pneumothorax) or from trauma to the chest.  Shingles infection (varicella-zoster virus).  Heart attack.  Damage to the bones, muscles, and cartilage that make up your chest wall. This can include:  Bruised bones due to injury.  Strained muscles or cartilage due to frequent or repeated coughing or overwork.  Fracture to one or more ribs.  Sore cartilage due to inflammation (costochondritis). RISK FACTORS  Risk factors for chest pain may include:  Activities that increase your risk for trauma or injury to your chest.  Respiratory infections or conditions that cause frequent coughing.  Medical conditions or overeating that can cause heartburn.  Heart disease or family history of heart disease.  Conditions or health behaviors that increase your risk of developing a blood clot.  Having had chicken pox (varicella zoster). SIGNS AND SYMPTOMS Chest pain can feel like:  Burning or tingling on the surface of your chest or deep in your chest.  Crushing, pressure, aching, or squeezing pain.  Dull or sharp pain that is worse  when you move, cough, or take a deep breath.  Pain that is also felt in your back, neck, shoulder, or arm, or pain that spreads to any of these areas. Your chest pain may come and go, or it may stay constant. DIAGNOSIS Lab tests or other studies may be needed to find the cause of your pain. Your health care provider may have you take a test called an ambulatory ECG (electrocardiogram). An ECG records your heartbeat patterns at the time the test is performed. You may also have other tests, such as:  Transthoracic echocardiogram (TTE). During echocardiography, sound waves are used to create a picture of all of the heart structures and to look at how blood flows through your heart.  Transesophageal echocardiogram (TEE).This is a more advanced imaging test that obtains images from inside your body. It allows your health care provider to see your heart in finer detail.  Cardiac monitoring. This allows your health care provider to monitor your heart rate and rhythm in real time.  Holter monitor. This is a portable device that records your heartbeat and can help to diagnose abnormal heartbeats. It allows your health care provider to track your heart activity for several days,  if needed.  Stress tests. These can be done through exercise or by taking medicine that makes your heart beat more quickly.  Blood tests.  Imaging tests. TREATMENT  Your treatment depends on what is causing your chest pain. Treatment may include:  Medicines. These may include:  Acid blockers for heartburn.  Anti-inflammatory medicine.  Pain medicine for inflammatory conditions.  Antibiotic medicine, if an infection is present.  Medicines to dissolve blood clots.  Medicines to treat coronary artery disease.  Supportive care for conditions that do not require medicines. This may include:  Resting.  Applying heat or cold packs to injured areas.  Limiting activities until pain decreases. HOME CARE  INSTRUCTIONS  If you were prescribed an antibiotic medicine, finish it all even if you start to feel better.  Avoid any activities that bring on chest pain.  Do not use any tobacco products, including cigarettes, chewing tobacco, or electronic cigarettes. If you need help quitting, ask your health care provider.  Do not drink alcohol.  Take medicines only as directed by your health care provider.  Keep all follow-up visits as directed by your health care provider. This is important. This includes any further testing if your chest pain does not go away.  If heartburn is the cause for your chest pain, you may be told to keep your head raised (elevated) while sleeping. This reduces the chance that acid will go from your stomach into your esophagus.  Make lifestyle changes as directed by your health care provider. These may include:  Getting regular exercise. Ask your health care provider to suggest some activities that are safe for you.  Eating a heart-healthy diet. A registered dietitian can help you to learn healthy eating options.  Maintaining a healthy weight.  Managing diabetes, if necessary.  Reducing stress. SEEK MEDICAL CARE IF:  Your chest pain does not go away after treatment.  You have a rash with blisters on your chest.  You have a fever. SEEK IMMEDIATE MEDICAL CARE IF:   Your chest pain is worse.  You have an increasing cough, or you cough up blood.  You have severe abdominal pain.  You have severe weakness.  You faint.  You have chills.  You have sudden, unexplained chest discomfort.  You have sudden, unexplained discomfort in your arms, back, neck, or jaw.  You have shortness of breath at any time.  You suddenly start to sweat, or your skin gets clammy.  You feel nauseous or you vomit.  You suddenly feel light-headed or dizzy.  Your heart begins to beat quickly, or it feels like it is skipping beats. These symptoms may represent a serious  problem that is an emergency. Do not wait to see if the symptoms will go away. Get medical help right away. Call your local emergency services (911 in the [place] Do not drive yourself to the hospital.   This information is not intended to replace advice given to you by your health care provider. Make sure you discuss any questions you have with your health care provider.   Document Released: 05/01/2005 Document Revised: 08/12/2014 Document Reviewed: 02/25/2014 Elsevier Interactive Patient Education 2016 Elsevier Inc.  Weakness Weakness is a lack of strength. You may feel weak all over your body or just in one part of your body. Weakness can be serious. In some cases, you may need more medical tests. HOME CARE  Rest.  Eat a well-balanced diet.  Try to exercise every day.  Only take medicines as told by  your doctor. GET HELP RIGHT AWAY IF:   You cannot do your normal daily activities.  You cannot walk up and down stairs, or you feel very tired when you do so.  You have shortness of breath or chest pain.  You have trouble moving parts of your body.  You have weakness in only one body part or on only one side of the body.  You have a fever.  You have trouble speaking or swallowing.  You cannot control when you pee (urinate) or poop (bowel movement).  You have black or bloody throw up (vomit) or poop.  Your weakness gets worse or spreads to other body parts.  You have new aches or pains. MAKE SURE YOU:   Understand these instructions.  Will watch your condition.  Will get help right away if you are not doing well or get worse.   This information is not intended to replace advice given to you by your health care provider. Make sure you discuss any questions you have with your health care provider.   Document Released: 07/04/2008 Document Revised: 01/21/2012 Document Reviewed: 09/20/2011 Elsevier Interactive Patient Education [employer]

## 2015-06-19 NOTE — ED Notes (Signed)
Pt denies CP, states that he has a funny afternoon, states he stayed in bed for hours and felt weak, denies pain,n/v, denies dizziness.

## 2015-06-21 ENCOUNTER — Encounter: Payer: Self-pay | Admitting: Internal Medicine

## 2015-06-22 ENCOUNTER — Encounter: Payer: Self-pay | Admitting: Cardiology

## 2015-06-22 LAB — CUP PACEART REMOTE DEVICE CHECK
Battery Remaining Percentage: 95 %
Date Time Interrogation Session: 20161101065745
Implantable Lead Implant Date: 20150824
Implantable Lead Location: 753860
Lead Channel Impedance Value: 590 Ohm
Lead Channel Pacing Threshold Amplitude: 0.625 V
Lead Channel Pacing Threshold Pulse Width: 0.4 ms
Lead Channel Sensing Intrinsic Amplitude: 10 mV
MDC IDC LEAD MODEL: 1948
MDC IDC MSMT BATTERY REMAINING LONGEVITY: 128 mo
MDC IDC MSMT BATTERY VOLTAGE: 3.02 V
MDC IDC SET LEADCHNL RV PACING AMPLITUDE: 0.875
MDC IDC SET LEADCHNL RV PACING PULSEWIDTH: 0.4 ms
MDC IDC SET LEADCHNL RV SENSING SENSITIVITY: 2 mV
MDC IDC STAT BRADY RV PERCENT PACED: 67 %
Pulse Gen Model: 1240
Pulse Gen Serial Number: 7616716

## 2015-07-06 DIAGNOSIS — K219 Gastro-esophageal reflux disease without esophagitis: Secondary | ICD-10-CM | POA: Diagnosis not present

## 2015-07-06 DIAGNOSIS — I129 Hypertensive chronic kidney disease with stage 1 through stage 4 chronic kidney disease, or unspecified chronic kidney disease: Secondary | ICD-10-CM | POA: Diagnosis not present

## 2015-07-06 DIAGNOSIS — M109 Gout, unspecified: Secondary | ICD-10-CM | POA: Diagnosis not present

## 2015-07-06 DIAGNOSIS — N183 Chronic kidney disease, stage 3 (moderate): Secondary | ICD-10-CM | POA: Diagnosis not present

## 2015-07-06 DIAGNOSIS — E039 Hypothyroidism, unspecified: Secondary | ICD-10-CM | POA: Diagnosis not present

## 2015-07-06 DIAGNOSIS — J069 Acute upper respiratory infection, unspecified: Secondary | ICD-10-CM | POA: Diagnosis not present

## 2015-07-06 DIAGNOSIS — I110 Hypertensive heart disease with heart failure: Secondary | ICD-10-CM | POA: Diagnosis not present

## 2015-08-28 DIAGNOSIS — E039 Hypothyroidism, unspecified: Secondary | ICD-10-CM | POA: Diagnosis not present

## 2015-08-30 DIAGNOSIS — E039 Hypothyroidism, unspecified: Secondary | ICD-10-CM | POA: Diagnosis not present

## 2015-08-30 DIAGNOSIS — K5792 Diverticulitis of intestine, part unspecified, without perforation or abscess without bleeding: Secondary | ICD-10-CM | POA: Diagnosis not present

## 2015-08-31 ENCOUNTER — Observation Stay (HOSPITAL_COMMUNITY)
Admission: EM | Admit: 2015-08-31 | Discharge: 2015-09-01 | Disposition: A | Discharge status: Home / Self Care | Payer: Medicare Other | Attending: Internal Medicine | Admitting: Internal Medicine

## 2015-08-31 ENCOUNTER — Encounter (HOSPITAL_COMMUNITY): Payer: Self-pay | Admitting: *Deleted

## 2015-08-31 DIAGNOSIS — J45909 Unspecified asthma, uncomplicated: Secondary | ICD-10-CM | POA: Insufficient documentation

## 2015-08-31 DIAGNOSIS — Z79899 Other long term (current) drug therapy: Secondary | ICD-10-CM | POA: Insufficient documentation

## 2015-08-31 DIAGNOSIS — I130 Hypertensive heart and chronic kidney disease with heart failure and stage 1 through stage 4 chronic kidney disease, or unspecified chronic kidney disease: Secondary | ICD-10-CM | POA: Insufficient documentation

## 2015-08-31 DIAGNOSIS — E785 Hyperlipidemia, unspecified: Secondary | ICD-10-CM | POA: Insufficient documentation

## 2015-08-31 DIAGNOSIS — I5022 Chronic systolic (congestive) heart failure: Secondary | ICD-10-CM

## 2015-08-31 DIAGNOSIS — E039 Hypothyroidism, unspecified: Secondary | ICD-10-CM | POA: Diagnosis not present

## 2015-08-31 DIAGNOSIS — I252 Old myocardial infarction: Secondary | ICD-10-CM | POA: Diagnosis not present

## 2015-08-31 DIAGNOSIS — Z7901 Long term (current) use of anticoagulants: Secondary | ICD-10-CM | POA: Diagnosis not present

## 2015-08-31 DIAGNOSIS — R6510 Systemic inflammatory response syndrome (SIRS) of non-infectious origin without acute organ dysfunction: Secondary | ICD-10-CM | POA: Insufficient documentation

## 2015-08-31 DIAGNOSIS — E872 Acidosis, unspecified: Secondary | ICD-10-CM

## 2015-08-31 DIAGNOSIS — N183 Chronic kidney disease, stage 3 unspecified: Secondary | ICD-10-CM | POA: Diagnosis present

## 2015-08-31 DIAGNOSIS — N179 Acute kidney failure, unspecified: Secondary | ICD-10-CM | POA: Diagnosis not present

## 2015-08-31 DIAGNOSIS — I441 Atrioventricular block, second degree: Secondary | ICD-10-CM | POA: Insufficient documentation

## 2015-08-31 DIAGNOSIS — B029 Zoster without complications: Principal | ICD-10-CM | POA: Insufficient documentation

## 2015-08-31 DIAGNOSIS — M109 Gout, unspecified: Secondary | ICD-10-CM | POA: Insufficient documentation

## 2015-08-31 DIAGNOSIS — N400 Enlarged prostate without lower urinary tract symptoms: Secondary | ICD-10-CM | POA: Insufficient documentation

## 2015-08-31 DIAGNOSIS — F4310 Post-traumatic stress disorder, unspecified: Secondary | ICD-10-CM | POA: Diagnosis not present

## 2015-08-31 DIAGNOSIS — I480 Paroxysmal atrial fibrillation: Secondary | ICD-10-CM

## 2015-08-31 DIAGNOSIS — I2510 Atherosclerotic heart disease of native coronary artery without angina pectoris: Secondary | ICD-10-CM | POA: Insufficient documentation

## 2015-08-31 DIAGNOSIS — Z950 Presence of cardiac pacemaker: Secondary | ICD-10-CM | POA: Diagnosis not present

## 2015-08-31 DIAGNOSIS — Z87891 Personal history of nicotine dependence: Secondary | ICD-10-CM | POA: Diagnosis not present

## 2015-08-31 DIAGNOSIS — A419 Sepsis, unspecified organism: Secondary | ICD-10-CM

## 2015-08-31 DIAGNOSIS — I481 Persistent atrial fibrillation: Secondary | ICD-10-CM | POA: Insufficient documentation

## 2015-08-31 DIAGNOSIS — I4891 Unspecified atrial fibrillation: Secondary | ICD-10-CM | POA: Diagnosis present

## 2015-08-31 DIAGNOSIS — I340 Nonrheumatic mitral (valve) insufficiency: Secondary | ICD-10-CM | POA: Diagnosis not present

## 2015-08-31 DIAGNOSIS — R1032 Left lower quadrant pain: Secondary | ICD-10-CM | POA: Diagnosis present

## 2015-08-31 DIAGNOSIS — I10 Essential (primary) hypertension: Secondary | ICD-10-CM | POA: Diagnosis present

## 2015-08-31 HISTORY — DX: Chronic kidney disease, stage 3 (moderate): N18.3

## 2015-08-31 HISTORY — DX: Hypothyroidism, unspecified: E03.9

## 2015-08-31 HISTORY — DX: Chronic kidney disease, stage 3 unspecified: N18.30

## 2015-08-31 LAB — COMPREHENSIVE METABOLIC PANEL
ALT: 18 U/L (ref 17–63)
ANION GAP: 12 (ref 5–15)
AST: 28 U/L (ref 15–41)
Albumin: 3.8 g/dL (ref 3.5–5.0)
Alkaline Phosphatase: 107 U/L (ref 38–126)
BUN: 19 mg/dL (ref 6–20)
CHLORIDE: 102 mmol/L (ref 101–111)
CO2: 21 mmol/L — ABNORMAL LOW (ref 22–32)
Calcium: 9.8 mg/dL (ref 8.9–10.3)
Creatinine, Ser: 1.72 mg/dL — ABNORMAL HIGH (ref 0.61–1.24)
GFR, EST AFRICAN AMERICAN: 37 mL/min — AB (ref >60)
GFR, EST NON AFRICAN AMERICAN: 32 mL/min — AB (ref >60)
Glucose, Bld: 119 mg/dL — ABNORMAL HIGH (ref 65–99)
POTASSIUM: 4.2 mmol/L (ref 3.5–5.1)
SODIUM: 135 mmol/L (ref 135–145)
Total Bilirubin: 0.4 mg/dL (ref 0.3–1.2)
Total Protein: 7.9 g/dL (ref 6.5–8.1)

## 2015-08-31 LAB — CBC
HEMATOCRIT: 41.4 % (ref 39.0–52.0)
HEMOGLOBIN: 14.1 g/dL (ref 13.0–17.0)
MCH: 28.8 pg (ref 26.0–34.0)
MCHC: 34.1 g/dL (ref 30.0–36.0)
MCV: 84.5 fL (ref 78.0–100.0)
PLATELETS: 207 10*3/uL (ref 150–400)
RBC: 4.9 MIL/uL (ref 4.22–5.81)
RDW: 16.7 % — ABNORMAL HIGH (ref 11.5–15.5)
WBC: 9.2 10*3/uL (ref 4.0–10.5)

## 2015-08-31 LAB — I-STAT CG4 LACTIC ACID, ED
LACTIC ACID, VENOUS: 2.48 mmol/L — AB (ref 0.5–2.0)
Lactic Acid, Venous: 4.1 mmol/L (ref 0.5–2.0)

## 2015-08-31 LAB — LIPASE, BLOOD: LIPASE: 26 U/L (ref 11–51)

## 2015-08-31 MED ORDER — FAMOTIDINE 20 MG PO TABS
20.0000 mg | ORAL_TABLET | Freq: Two times a day (BID) | ORAL | Status: DC
  Administered 2015-08-31: 20 mg via ORAL
  Filled 2015-08-31: qty 1

## 2015-08-31 MED ORDER — ONDANSETRON HCL 4 MG/2ML IJ SOLN: 4.0000 mg | Freq: Four times a day (QID) | INTRAMUSCULAR | Status: DC | PRN

## 2015-08-31 MED ORDER — LEVOTHYROXINE SODIUM 50 MCG PO TABS
50.0000 ug | ORAL_TABLET | Freq: Every day | ORAL | Status: DC
Start: 2015-09-01 — End: 2015-09-01
  Administered 2015-09-01: 50 ug via ORAL
  Filled 2015-08-31: qty 1

## 2015-08-31 MED ORDER — SODIUM CHLORIDE 0.9 % IV BOLUS (SEPSIS)
1000.0000 mL | Freq: Once | INTRAVENOUS | Status: AC
  Administered 2015-08-31: 1000 mL via INTRAVENOUS

## 2015-08-31 MED ORDER — OXYCODONE-ACETAMINOPHEN 5-325 MG PO TABS
2.0000 | ORAL_TABLET | Freq: Four times a day (QID) | ORAL | Status: DC | PRN
  Administered 2015-09-01: 2 via ORAL
  Filled 2015-08-31: qty 2

## 2015-08-31 MED ORDER — HYDRALAZINE HCL 20 MG/ML IJ SOLN
5.0000 mg | INTRAMUSCULAR | Status: DC | PRN
Start: 2015-08-31 — End: 2015-09-01

## 2015-08-31 MED ORDER — SODIUM CHLORIDE 0.9% FLUSH: 3.0000 mL | Freq: Two times a day (BID) | INTRAVENOUS | Status: DC

## 2015-08-31 MED ORDER — ONDANSETRON HCL 4 MG PO TABS: 4.0000 mg | ORAL_TABLET | Freq: Four times a day (QID) | ORAL | Status: DC | PRN

## 2015-08-31 MED ORDER — APIXABAN 2.5 MG PO TABS
2.5000 mg | ORAL_TABLET | Freq: Two times a day (BID) | ORAL | Status: DC
  Administered 2015-09-01: 2.5 mg via ORAL
  Filled 2015-08-31: qty 1

## 2015-08-31 MED ORDER — FENTANYL CITRATE (PF) 100 MCG/2ML IJ SOLN
50.0000 ug | INTRAMUSCULAR | Status: DC | PRN
  Administered 2015-08-31: 50 ug via INTRAVENOUS
  Filled 2015-08-31: qty 2

## 2015-08-31 MED ORDER — ALLOPURINOL 300 MG PO TABS
450.0000 mg | ORAL_TABLET | Freq: Every day | ORAL | Status: DC
  Administered 2015-09-01: 450 mg via ORAL
  Filled 2015-08-31: qty 2

## 2015-08-31 MED ORDER — VALACYCLOVIR HCL 500 MG PO TABS
1000.0000 mg | ORAL_TABLET | Freq: Three times a day (TID) | ORAL | Status: DC
  Filled 2015-08-31 (×3): qty 2

## 2015-08-31 MED ORDER — HYDROMORPHONE HCL 1 MG/ML IJ SOLN: 0.5000 mg | INTRAMUSCULAR | Status: AC | PRN

## 2015-08-31 MED ORDER — VITAMIN D 1000 UNITS PO TABS
1000.0000 [IU] | ORAL_TABLET | Freq: Two times a day (BID) | ORAL | Status: DC
  Administered 2015-09-01: 1000 [IU] via ORAL
  Filled 2015-08-31: qty 1

## 2015-08-31 MED ORDER — PANTOPRAZOLE SODIUM 40 MG PO TBEC
40.0000 mg | DELAYED_RELEASE_TABLET | Freq: Every day | ORAL | Status: DC
  Administered 2015-09-01: 40 mg via ORAL
  Filled 2015-08-31: qty 1

## 2015-08-31 MED ORDER — ISOSORBIDE MONONITRATE ER 60 MG PO TB24
60.0000 mg | ORAL_TABLET | Freq: Every day | ORAL | Status: DC
  Administered 2015-09-01: 60 mg via ORAL
  Filled 2015-08-31: qty 1

## 2015-08-31 MED ORDER — ACETAMINOPHEN 500 MG PO TABS: 500.0000 mg | ORAL_TABLET | Freq: Four times a day (QID) | ORAL | Status: DC | PRN

## 2015-08-31 MED ORDER — AMLODIPINE BESYLATE 5 MG PO TABS
5.0000 mg | ORAL_TABLET | Freq: Every day | ORAL | Status: DC
  Administered 2015-09-01: 5 mg via ORAL
  Filled 2015-08-31: qty 1

## 2015-08-31 MED ORDER — HYDROMORPHONE HCL 1 MG/ML IJ SOLN
0.5000 mg | Freq: Once | INTRAMUSCULAR | Status: AC
  Administered 2015-08-31: 0.5 mg via INTRAVENOUS
  Filled 2015-08-31: qty 1

## 2015-08-31 MED ORDER — VALACYCLOVIR HCL 500 MG PO TABS
1000.0000 mg | ORAL_TABLET | Freq: Once | ORAL | Status: AC
  Administered 2015-08-31: 1000 mg via ORAL
  Filled 2015-08-31: qty 2

## 2015-08-31 MED ORDER — SODIUM CHLORIDE 0.9 % IV SOLN
INTRAVENOUS | Status: AC
  Administered 2015-08-31: 22:00:00 via INTRAVENOUS

## 2015-08-31 MED ORDER — MORPHINE SULFATE (PF) 2 MG/ML IV SOLN: 2.0000 mg | INTRAVENOUS | Status: DC | PRN

## 2015-08-31 MED ORDER — NITROGLYCERIN 0.4 MG SL SUBL
0.4000 mg | SUBLINGUAL_TABLET | SUBLINGUAL | Status: DC | PRN
Start: 2015-08-31 — End: 2015-09-01

## 2015-08-31 MED ORDER — FINASTERIDE 5 MG PO TABS: 5.0000 mg | ORAL_TABLET | Freq: Every evening | ORAL | Status: DC

## 2015-08-31 NOTE — ED Provider Notes (Signed)
CSN: [id number]     Arrival date & time 08/31/15  1513 History   First MD Initiated Contact with Patient 08/31/15 1746     Chief Complaint  Patient presents with  . Abdominal Pain     (Consider location/radiation/quality/duration/timing/severity/associated sxs/prior Treatment) Patient is a 82 y.o. male presenting with abdominal pain. The history is provided by the patient.  Abdominal Pain Pain location:  L flank and LLQ Pain quality: aching, burning and sharp   Pain radiates to:  Does not radiate Pain severity:  Moderate Onset quality:  Gradual Duration:  2 days Timing:  Constant Progression:  Worsening Chronicity:  New Relieved by:  Nothing Worsened by:  Nothing tried Ineffective treatments:  None tried Associated symptoms: no fever and no vomiting   Associated symptoms comment:  Vesicular rash on left side Risk factors comment:  Elderly   Past Medical History  Diagnosis Date  . Mitral insufficiency     chronically moderate to severe  . Hypertension   . Hyperlipidemia   . 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 Coronary artery disease     a. Remote lateral infarct;  b. 09/2011 MV: large dense inferolat/lat scar - ? very slight peri-infarcrt isch.  No signif change c/w 09/2010 scan.  . Raynaud's disease /phenomenon   . Hard of hearing     bilaterally  . Gout   . PTSD (post-traumatic stress disorder)     "being treated for it now but not a big degree"  . Asthma   . Noncompliance with medication regimen   . Chronic systolic CHF (congestive heart failure) (HCC)     a. 10/2011 Echo: EF 40-45%, inf AK, PASP .  Marland Kitchen Persistent atrial fibrillation [facility])    Past Surgical History  Procedure Laterality Date  . Prostatectomy    . Tonsillectomy    . Inguinal hernia repair      left  . Appendectomy    . Cardiac catheterization  2003    SHOWED OCCLUSION OF THE LEFT CIRCUMFLEX CORONARY ARTERY  . Pacemaker insertion  03/28/14    STJ single chamber pacemaker implanted by  Dr Graciela Husbands for symptomatic bradycardia  . Left heart catheterization with coronary angiogram N/A 08/29/2011    Procedure: LEFT HEART CATHETERIZATION WITH CORONARY ANGIOGRAM;  Surgeon: Kathleene Hazel, MD;  Location: [facility] CATH LAB;  Service: Cardiovascular;  Laterality: N/A;  . Permanent pacemaker insertion N/A 03/28/2014    Procedure: PERMANENT PACEMAKER INSERTION;  Surgeon: Duke Salvia, MD;  Location[facility] CATH LAB;  Service: Cardiovascular;  Laterality: N/A;   Family History  Problem Relation Age of Onset  . Heart attack Brother   . Alcohol abuse Father     died in his late 10's  . Other Mother     died in her late 81's - old age.   Social History  Substance Use Topics  . Smoking status: Former Smoker -- 1.50 packs/day for 20 years    Quit date: 12/12/1970  . Smokeless tobacco: Never Used  . Alcohol Use: No    Review of Systems  Constitutional: Negative for fever.  Gastrointestinal: Positive for abdominal pain. Negative for vomiting.  All other systems reviewed and are negative.     Allergies  Zocor  Home Medications   Prior to Admission medications   Medication Sig Start Date End Date Taking? Authorizing Provider  acetaminophen (TYLENOL) 500 MG tablet Take 500 mg by mouth every 6 (six) hours as needed for moderate pain.  Historical Provider, MD  allopurinol (ZYLOPRIM) 300 MG tablet Take 450 mg by mouth daily.     Historical Provider, MD  amLODipine (NORVASC) 5 MG tablet Take 5 mg by mouth daily.    Historical Provider, MD  apixaban (ELIQUIS) 2.5 MG TABS tablet Take 1 tablet (2.5 mg total) by mouth 2 (two) times daily. 08/12/14   Peter M [place], MD  Cholecalciferol (VITAMIN D-3) 1000 UNITS CAPS Take 1,000 Units by mouth 2 (two) times daily.     Historical Provider, MD  finasteride (PROSCAR) 5 MG tablet Take 5 mg by mouth every evening.     Historical Provider, MD  furosemide (LASIX) 20 MG tablet Take 2 tablets (40 mg total) by mouth daily. 01/16/15   Peter M [place], MD    isosorbide mononitrate (IMDUR) 60 MG 24 hr tablet Take 1 tablet (60 mg total) by mouth daily. 05/31/15   Peter M [place], MD  levothyroxine (SYNTHROID, LEVOTHROID) 50 MCG tablet Take 50 mcg by mouth daily before breakfast.    Historical Provider, MD  losartan (COZAAR) 50 MG tablet Take 1 tablet (50 mg total) by mouth daily. 05/23/14   Peter M [place], MD  nitroGLYCERIN (NITROSTAT) 0.4 MG SL tablet Place 1 tablet (0.4 mg total) under the tongue every 5 (five) minutes as needed. x3 doses as needed for chest pain 10/18/12   Ok Anis, NP  omeprazole (PRILOSEC) 20 MG capsule Take 40 mg by mouth daily.     Historical Provider, MD  ranitidine (ZANTAC) 300 MG capsule Take 300 mg by mouth every evening.    Historical Provider, MD   BP 115/72 mmHg  Pulse 73  Temp(Src) 97.4 F (36.3 C) (Oral)  Resp 13  SpO2 95% Physical Exam  Constitutional: He is oriented to person, place, and time. He appears well-developed and well-nourished. No distress.  HENT:  Head: Normocephalic and atraumatic.  Eyes: Conjunctivae are normal.  Neck: Neck supple. No tracheal deviation present.  Cardiovascular: Normal rate, regular rhythm and normal heart sounds.   Pulmonary/Chest: Effort normal and breath sounds normal. No respiratory distress.  Abdominal: Soft. He exhibits no distension. There is no tenderness. There is no rebound and no guarding.  Neurological: He is alert and oriented to person, place, and time.  Skin: Skin is warm and dry. Rash (with overlying tenderness) noted. Rash is vesicular.     Psychiatric: He has a normal mood and affect.  Vitals reviewed.   ED Course  Procedures (including critical care time) Labs Review Labs Reviewed  COMPREHENSIVE METABOLIC PANEL - Abnormal; Notable for the following:    CO2 21 (*)    Glucose, Bld 119 (*)    Creatinine, Ser 1.72 (*)    GFR calc non Af Amer 32 (*)    GFR calc Af Amer 37 (*)    All other components within normal limits  CBC - Abnormal;  Notable for the following:    RDW 16.7 (*)    All other components within normal limits  PROTIME-INR - Abnormal; Notable for the following:    Prothrombin Time 16.6 (*)    All other components within normal limits  I-STAT CG4 LACTIC ACID, ED - Abnormal; Notable for the following:    Lactic Acid, Venous 4.10 (*)    All other components within normal limits  I-STAT CG4 LACTIC ACID, ED - Abnormal; Notable for the following:    Lactic Acid, Venous 2.48 (*)    All other components within normal limits  CULTURE, BLOOD (ROUTINE X  2)  CULTURE, BLOOD (ROUTINE X 2)  LIPASE, BLOOD  CREATININE, URINE, RANDOM  LACTIC ACID, PLASMA  LACTIC ACID, PLASMA  PROCALCITONIN  APTT  UREA NITROGEN, URINE  BRAIN NATRIURETIC PEPTIDE  BASIC METABOLIC PANEL  CBC    Imaging Review No results found. I have personally reviewed and evaluated these images and lab results as part of my medical decision-making.   EKG Interpretation None      MDM   Final diagnoses:  Shingles outbreak  Lactic acidosis    82 year old male presents with left lower quadrant abdominal pain and flank pain that started yesterday. He was seen by his primary care physician and started on Cipro and Flagyl for presumed diverticulitis given the location of his symptoms. Today he had a vesicular rash erupt in a dermatomal pattern only on the left of midline c/w shingles outbreak. As part of screening lab work Pt was found to have lactic acidosis presumably from dehydration and mild hypotension, both improved with IVF administration. Pain not controlled with multiple parenteral pain med administrations. Pt and daughter requesting admission for pain control. Hospitalist was consulted for admission and will see the patient in the emergency department. Given first dose of valacyclovir.     Lyndal Pulley, MD 09/01/15 [phone]

## 2015-08-31 NOTE — ED Notes (Signed)
Patient provided urinal, pt attempted to give urine sample but states he is unable to urinate at this time.

## 2015-08-31 NOTE — ED Notes (Signed)
Pt states that he has had left lower abdominal and left flank pain for 1 week. Denies N/V/D and urinary symptoms.

## 2015-08-31 NOTE — ED Notes (Signed)
MD at bedside. 

## 2015-08-31 NOTE — H&P (Signed)
[facility] History and Physical  Craig [name] ZOX:[id number] DOB: 1919-05-22 DOA: 08/31/2015  Referring physician: ED physician PCP: [name], MD  Specialists:   Chief Complaint: Pain over left flank and left lower abdomen  HPI: Craig [name] is a 82 y.o. male with PMH of hypertension, hyperlipidemia, asthma, hypothyroidism, gout, pacemaker placement, mitral valve insufficiency, CAD, PTSD, chronic kidney disease-stage III, systolic congestive heart failure with EF for 45-50 percent, atrial fibrillation on Eliquis, BPH, who presents with pain over left flank and left lower abdomen.  Patient reports that he has been having pain over left flank and left lower abdomen for about 1 week. He does not have nausea, vomiting, diarrhea. No fever or chills. He was empirically diagnosed and treated as diverticulitis by his doctor yesterday, but he continues to have severe pain. He does not have chest pain, shortness of breath, cough, symptoms of UTI or unilateral weakness. He noticed rashes over left flank area, which is in band like distribution.  In ED, patient was found to have elevated lactic acid 4.01-->2.48, negative lipase, worsening renal function, temperature normal, tachycardia, tachypnea. Patient is admitted to inpatient for further evaluation and treatment.  EKG: Not done in ED, will get one.   Where does patient live?   At home  Can patient participate in ADLs?  Some   Review of Systems:   General: no fevers, chills, no changes in body weight, has fatigue HEENT: no blurry vision, hearing changes or sore throat Pulm: no dyspnea, coughing, wheezing CV: no chest pain, palpitations Abd: no nausea, vomiting, diarrhea, constipation. Has pain over left abdomen and flank area. GU: no dysuria, burning on urination, increased urinary frequency, hematuria  Ext: no leg edema Neuro: no unilateral weakness, numbness, or tingling, no vision change or hearing loss Skin: has  rashes over left flank area, typical for shingles. MSK: No muscle spasm, no deformity, no limitation of range of movement in spin Heme: No easy bruising.  Travel history: No recent long distant travel.  Allergy:  Allergies  Allergen Reactions  . Zocor [Simvastatin] Other (See Comments)    Unknown reaction per pt    Past Medical History  Diagnosis Date  . Mitral insufficiency     chronically moderate to severe  . Hypertension   . Hyperlipidemia   . 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 Coronary artery disease     a. Remote lateral infarct;  b. 09/2011 MV: large dense inferolat/lat scar - ? very slight peri-infarcrt isch.  No signif change c/w 09/2010 scan.  . Raynaud's disease /phenomenon   . Hard of hearing     bilaterally  . Gout   . PTSD (post-traumatic stress disorder)     "being treated for it now but not a big degree"  . Asthma   . Noncompliance with medication regimen   . Chronic systolic CHF (congestive heart failure) (HCC)     a. 10/2011 Echo: EF 40-45%, inf AK, PASP .  Marland Kitchen Persistent atrial fibrillation (HCC)   . CKD (chronic kidney disease), stage III   . Hypothyroidism     Past Surgical History  Procedure Laterality Date  . Prostatectomy    . Tonsillectomy    . Inguinal hernia repair      left  . Appendectomy    . Cardiac catheterization  2003    SHOWED OCCLUSION OF THE LEFT CIRCUMFLEX CORONARY ARTERY  . Pacemaker insertion  03/28/14    STJ single chamber pacemaker  implanted by Dr Graciela Husbands for symptomatic bradycardia  . Left heart catheterization with coronary angiogram N/A 08/29/2011    Procedure: LEFT HEART CATHETERIZATION WITH CORONARY ANGIOGRAM;  Surgeon: Kathleene Hazel, MD;  Location: [facility] CATH LAB;  Service: Cardiovascular;  Laterality: N/A;  . Permanent pacemaker insertion N/A 03/28/2014    Procedure: PERMANENT PACEMAKER INSERTION;  Surgeon: Duke Salvia, MD;  Location: [facility] CATH LAB;  Service: Cardiovascular;  Laterality: N/A;    Social  History:  reports that he quit smoking about 44 years ago. He has never used smokeless tobacco. He reports that he does not drink alcohol or use illicit drugs.  Family History:  Family History  Problem Relation Age of Onset  . Heart attack Brother   . Alcohol abuse Father     died in his late 27's  . Other Mother     died in her late 66's - old age.     Prior to Admission medications   Medication Sig Start Date End Date Taking? Authorizing Provider  allopurinol (ZYLOPRIM) 300 MG tablet Take 450 mg by mouth daily.    Yes Historical Provider, MD  amLODipine (NORVASC) 5 MG tablet Take 5 mg by mouth daily.   Yes Historical Provider, MD  apixaban (ELIQUIS) 2.5 MG TABS tablet Take 1 tablet (2.5 mg total) by mouth 2 (two) times daily. 08/12/14  Yes Peter M [place], MD  Cholecalciferol (VITAMIN D-3) 1000 UNITS CAPS Take 1,000 Units by mouth 2 (two) times daily.    Yes Historical Provider, MD  finasteride (PROSCAR) 5 MG tablet Take 5 mg by mouth every evening.    Yes Historical Provider, MD  furosemide (LASIX) 20 MG tablet Take 2 tablets (40 mg total) by mouth daily. 01/16/15  Yes Peter M [place], MD  isosorbide mononitrate (IMDUR) 60 MG 24 hr tablet Take 1 tablet (60 mg total) by mouth daily. 05/31/15  Yes Peter M [place], MD  levothyroxine (SYNTHROID, LEVOTHROID) 50 MCG tablet Take 50 mcg by mouth daily before breakfast.   Yes Historical Provider, MD  losartan (COZAAR) 50 MG tablet Take 1 tablet (50 mg total) by mouth daily. 05/23/14  Yes Peter M [place], MD  omeprazole (PRILOSEC) 20 MG capsule Take 20 mg by mouth daily.    Yes Historical Provider, MD  ranitidine (ZANTAC) 300 MG capsule Take 300 mg by mouth every evening.   Yes Historical Provider, MD  acetaminophen (TYLENOL) 500 MG tablet Take 500 mg by mouth every 6 (six) hours as needed for moderate pain.    Historical Provider, MD  nitroGLYCERIN (NITROSTAT) 0.4 MG SL tablet Place 1 tablet (0.4 mg total) under the tongue every 5 (five) minutes as  needed. x3 doses as needed for chest pain 10/18/12   Ok Anis, NP    Physical Exam: Filed Vitals:   08/31/15 2045 08/31/15 2130 08/31/15 2145 08/31/15 2200  BP: 124/57 100/50 111/73 114/67  Pulse:  83 89 58  Temp:      TempSrc:      Resp: SpO2:  92% 100% 97%   General: Not in acute distress HEENT:       Eyes: PERRL, EOMI, no scleral icterus.       ENT: No discharge from the ears and nose, no pharynx injection, no tonsillar enlargement.        Neck: No JVD, no bruit, no mass felt. Heme: No neck lymph node enlargement. Cardiac: S1/S2, RRR, No murmurs, No gallops or rubs. Pulm: No rales, wheezing,  rhonchi or rubs. Abd: Soft, nondistended, nontender, no rebound pain, no organomegaly, BS present. Ext: No pitting leg edema bilaterally. 2+DP/PT pulse bilaterally. Musculoskeletal: No joint deformities, No joint redness or warmth, no limitation of ROM in spin. Skin: has rashes in band like distribution. Neuro: Alert, oriented X3, cranial nerves II-XII grossly intact,   Psych: Patient is not psychotic, no suicidal or hemocidal ideation.  Labs on Admission:  Basic Metabolic Panel:  Recent Labs Lab 08/31/15 1527  NA 135  K 4.2  CL 102  CO2 21*  GLUCOSE 119*  BUN 19  CREATININE 1.72*  CALCIUM 9.8   Liver Function Tests:  Recent Labs Lab 08/31/15 1527  AST 28  ALT 18  ALKPHOS 107  BILITOT 0.4  PROT 7.9  ALBUMIN 3.8    Recent Labs Lab 08/31/15 1527  LIPASE 26   No results for input(s): AMMONIA in the last 168 hours. CBC:  Recent Labs Lab 08/31/15 1527  WBC 9.2  HGB 14.1  HCT 41.4  MCV 84.5  PLT 207   Cardiac Enzymes: No results for input(s): CKTOTAL, CKMB, CKMBINDEX, TROPONINI in the last 168 hours.  BNP (last 3 results) No results for input(s): BNP in the last 8760 hours.  ProBNP (last 3 results) No results for input(s): PROBNP in the last 8760 hours.  CBG: No results for input(s): GLUCAP in the last 168  hours.  Radiological Exams on Admission: No results found.  Assessment/Plan Principal Problem:   Shingles Active Problems:   Coronary artery disease   Mitral insufficiency   Hypertension   Mobitz (type) I (Wenckebach's) atrioventricular block   Chronic systolic CHF (congestive heart failure) (HCC)   Atrial fibrillation (HCC)   Acute renal failure superimposed on stage 3 chronic kidney disease (HCC)   Sepsis (HCC)   BPH (benign prostatic hyperplasia)   Hypothyroidism   Shingles: Patient has typical rashes for shingles, which explains his pain over left flank and abdomen. Patient has severe pain currently. Since patient also has worsening renal function and sepsis, will admit patient for observation and pain control.  -will admit to telemetry bed with droplet isolation and negative pressure for observation (patient has A. fib, needs telemetry monitor) -Start valtrex orally -pain control: When necessary Percocet and morphine -When necessary Zofran for nausea -IV fluid  Sepsis: Patient meets criteria for sepsis with elevated lactate and tachycardia. He's hemodynamically stable. His lactic acid level is trending down with IV fluid treatment in the emergency room. -will get Procalcitonin and trend lactic acid levels per sepsis protocol. -IVF: 2L of NS bolus in ED, followed by 75 cc/h (patient has a congestive heart failure, limiting aggressive IV fluids treatment).  AoCKD-III: Baseline Cre is 1.2~1.4, his Cre 1.72, BUN 19 is on admission. Likely due to prerenal secondary to dehydration and continuation of ARB, diruetics, - IVF as above - Check FeUrea - Follow up renal function by BMP - Hold Lasix and losartan  Chronic systolic CHF (congestive heart failure) (HCC): 2-D echo on 07/15/14 showed EF of 45-50 percent. Patient is taking Lasix 40 mg daily at home. No leg edema. CHF is compensated. -Hold Lasix due to worsening renal function -Check BMP  CAD: No CP. -continue Imdur -When  necessary nitroglycerin  Hypertension: -Hold losartan and Lasix due to worsening renal function -Continue amlodipine -IV hydralazine.  GERD: -Protonix and pepced  Hypothyroidism: Last TSH was 3.54 on 11/12/13 -Continue home Synthroid  Atrial Fibrillation: CHA2DS2-VASc Score is 4, needs oral anticoagulation. Patient is on Eliquis at home. INR  is pending on admission. Heart rate is well controlled without CCB or BB. -continue Eliquis  BPH: stable - Continue Proscar  DVT ppx: On Eliquis  Code Status: DNR Family Communication: Yes, patient's daugher  at bed side Disposition Plan: Admit to inpatient   Date of Service 08/31/2015    Lorretta Harp [facility] Pager [phone]  If 7PM-7AM, please contact night-coverage [email or web address] Password [facility] 08/31/2015, 10:31 PM

## 2015-08-31 NOTE — ED Notes (Signed)
Pt reassessed and blanching and blue fingers.  Palpable radial pulses.  Pt is complaining of severe abdominal pain

## 2015-09-01 DIAGNOSIS — I4891 Unspecified atrial fibrillation: Secondary | ICD-10-CM | POA: Diagnosis not present

## 2015-09-01 DIAGNOSIS — N179 Acute kidney failure, unspecified: Secondary | ICD-10-CM | POA: Diagnosis not present

## 2015-09-01 DIAGNOSIS — I5022 Chronic systolic (congestive) heart failure: Secondary | ICD-10-CM | POA: Diagnosis not present

## 2015-09-01 DIAGNOSIS — B029 Zoster without complications: Secondary | ICD-10-CM | POA: Diagnosis not present

## 2015-09-01 LAB — CBC
HCT: 35.3 % — ABNORMAL LOW (ref 39.0–52.0)
Hemoglobin: 11.6 g/dL — ABNORMAL LOW (ref 13.0–17.0)
MCH: 27.6 pg (ref 26.0–34.0)
MCHC: 32.9 g/dL (ref 30.0–36.0)
MCV: 84 fL (ref 78.0–100.0)
Platelets: 168 10*3/uL (ref 150–400)
RBC: 4.2 MIL/uL — ABNORMAL LOW (ref 4.22–5.81)
RDW: 16.7 % — AB (ref 11.5–15.5)
WBC: 7 10*3/uL (ref 4.0–10.5)

## 2015-09-01 LAB — LACTIC ACID, PLASMA
LACTIC ACID, VENOUS: 1.6 mmol/L (ref 0.5–2.0)
Lactic Acid, Venous: 1.3 mmol/L (ref 0.5–2.0)

## 2015-09-01 LAB — BASIC METABOLIC PANEL
ANION GAP: 8 (ref 5–15)
BUN: 20 mg/dL (ref 6–20)
CALCIUM: 8.5 mg/dL — AB (ref 8.9–10.3)
CO2: 20 mmol/L — ABNORMAL LOW (ref 22–32)
Chloride: 109 mmol/L (ref 101–111)
Creatinine, Ser: 1.72 mg/dL — ABNORMAL HIGH (ref 0.61–1.24)
GFR calc Af Amer: 37 mL/min — ABNORMAL LOW (ref >60)
GFR, EST NON AFRICAN AMERICAN: 32 mL/min — AB (ref >60)
Glucose, Bld: 152 mg/dL — ABNORMAL HIGH (ref 65–99)
POTASSIUM: 3.9 mmol/L (ref 3.5–5.1)
SODIUM: 137 mmol/L (ref 135–145)

## 2015-09-01 LAB — PROTIME-INR
INR: 1.33 (ref 0.00–1.49)
PROTHROMBIN TIME: 16.6 s — AB (ref 11.6–15.2)

## 2015-09-01 LAB — CREATININE, URINE, RANDOM: Creatinine, Urine: 131.33 mg/dL

## 2015-09-01 LAB — PROCALCITONIN

## 2015-09-01 LAB — BRAIN NATRIURETIC PEPTIDE: B NATRIURETIC PEPTIDE 5: 220.8 pg/mL — AB (ref 0.0–100.0)

## 2015-09-01 LAB — APTT: aPTT: 32 seconds (ref 24–37)

## 2015-09-01 MED ORDER — FUROSEMIDE 20 MG PO TABS: 40.0000 mg | ORAL_TABLET | Freq: Every day | ORAL | Status: DC

## 2015-09-01 MED ORDER — FAMOTIDINE 20 MG PO TABS
20.0000 mg | ORAL_TABLET | Freq: Every day | ORAL | Status: DC
  Administered 2015-09-01: 20 mg via ORAL

## 2015-09-01 MED ORDER — SODIUM CHLORIDE 0.9 % IV SOLN: INTRAVENOUS | Status: DC

## 2015-09-01 MED ORDER — OXYCODONE HCL 5 MG PO TABS: 5.0000 mg | ORAL_TABLET | Freq: Three times a day (TID) | ORAL | Status: DC | PRN

## 2015-09-01 MED ORDER — FAMOTIDINE 20 MG PO TABS
20.0000 mg | ORAL_TABLET | Freq: Two times a day (BID) | ORAL | Status: DC
  Filled 2015-09-01: qty 1

## 2015-09-01 MED ORDER — VALACYCLOVIR HCL 500 MG PO TABS
1000.0000 mg | ORAL_TABLET | Freq: Every day | ORAL | Status: DC
  Filled 2015-09-01: qty 2

## 2015-09-01 MED ORDER — VALACYCLOVIR HCL 1 G PO TABS: 1000.0000 mg | ORAL_TABLET | Freq: Every day | ORAL | Status: DC

## 2015-09-01 NOTE — Care Management Note (Signed)
Case Management Note  Patient Details  Name: Craig [name] MRN: [id number] Date of Birth: 1919-06-17  Subjective/Objective:         Admitted with Shingles           Action/Plan: Patient lives with his daughter Craig [name], patient use a stick at home for when he walks outside, [facility] choice offered, daughter chose [facility] for HHPT. Mary with [facility] called for arrangements.  Expected Discharge Date:     09/01/2015             Expected Discharge Plan:  Home w Home Health Services   Discharge planning Services  CM Consult  Choice offered to:  Adult Children  HH Arranged:  PT HH Agency:  [facility]  Status of Service:  In process, will continue to follow  Reola Mosher [phone] 09/01/2015, 4:53 PM

## 2015-09-01 NOTE — ED Notes (Signed)
RN called to check status of negative pressure room verification on 3E

## 2015-09-01 NOTE — Discharge Summary (Signed)
Physician Discharge Summary  Craig [name] ZOX:[id number] DOB: 1919-03-30 DOA: 08/31/2015  PCP: [name], MD  Admit date: 08/31/2015 Discharge date: 09/01/2015  Time spent: 55  minutes  Recommendations for Outpatient Follow-up:  1. No home health needs  2. CBC Bmet early next week at PCPs office- considering adjusting Dose of Furosemide if Creatinine is rising.- ARB on hold for now  Discharge Condition: stable    Discharge Diagnoses:  Principal Problem:   Shingles Active Problems:   Coronary artery disease   Mitral insufficiency   Hypertension   Mobitz (type) I (Wenckebach's) atrioventricular block   Chronic systolic CHF (congestive heart failure) (HCC)   Atrial fibrillation (HCC)   Acute renal failure superimposed on stage 3 chronic kidney disease (HCC)   Sepsis (HCC)   BPH (benign prostatic hyperplasia)   Hypothyroidism   History of present illness:  Varicella zoster rash on left abdomen - presented with pain from the rash which is now better controlled - will d/c home with Valtrex and low Dose oxycodone which he appears to be tolerating without confusion or lethargy  SIRS - with tacychardia and lactic acidosis - resolved with IVF hydration- cont Lasix tomorrow for CHF  Chronic s CHF- stable - can resume Lasix tomorrow AM  AKI - mild elevated Cr- baseline is 1.2- Cr is 1.7 today- held Lasix and hydrated- will need Cr rechecked early next week by PCP - cont to hold ARB  Procedures: Discharge Exam: Filed Weights   09/01/15 0223  Weight: 81.602 kg (179 lb 14.4 oz)   Filed Vitals:   09/01/15 0730 09/01/15 0819  BP: 140/70 106/62  Pulse: 68 77  Temp:  98.6 F (37 C)  Resp:  20    General: AAO x 3, no distress Cardiovascular: RRR, no murmurs  Respiratory: clear to auscultation bilaterally GI: soft, non-tender, non-distended, bowel sound positive  Discharge Instructions You were cared for by a hospitalist during your hospital stay. If you  have any questions about your discharge medications or the care you received while you were in the hospital after you are discharged, you can call the unit and asked to speak with the hospitalist on call if the hospitalist that took care of you is not available. Once you are discharged, your primary care physician will handle any further medical issues. Please note that NO REFILLS for any discharge medications will be authorized once you are discharged, as it is imperative that you return to your primary care physician (or establish a relationship with a primary care physician if you do not have one) for your aftercare needs so that they can reassess your need for medications and monitor your lab values.      Discharge Instructions    Diet - low sodium heart healthy    Complete by:  As directed      Diet - low sodium heart healthy    Complete by:  As directed      Discharge instructions    Complete by:  As directed   You will the following need blood work checked at your family doctor's office early next week: CBC and Bmet- DO NOT MISS THIS     Increase activity slowly    Complete by:  As directed             Medication List    STOP taking these medications        losartan 50 MG tablet  Commonly known as:  COZAAR  TAKE these medications        acetaminophen 500 MG tablet  Commonly known as:  TYLENOL  Take 500 mg by mouth every 6 (six) hours as needed for moderate pain.     allopurinol 300 MG tablet  Commonly known as:  ZYLOPRIM  Take 450 mg by mouth daily.     amLODipine 5 MG tablet  Commonly known as:  NORVASC  Take 5 mg by mouth daily.     apixaban 2.5 MG Tabs tablet  Commonly known as:  ELIQUIS  Take 1 tablet (2.5 mg total) by mouth 2 (two) times daily.     finasteride 5 MG tablet  Commonly known as:  PROSCAR  Take 5 mg by mouth every evening.     furosemide 20 MG tablet  Commonly known as:  LASIX  Take 2 tablets (40 mg total) by mouth daily.  Start taking  on:  09/02/2015     isosorbide mononitrate 60 MG 24 hr tablet  Commonly known as:  IMDUR  Take 1 tablet (60 mg total) by mouth daily.     levothyroxine 50 MCG tablet  Commonly known as:  SYNTHROID, LEVOTHROID  Take 50 mcg by mouth daily before breakfast.     nitroGLYCERIN 0.4 MG SL tablet  Commonly known as:  NITROSTAT  Place 1 tablet (0.4 mg total) under the tongue every 5 (five) minutes as needed. x3 doses as needed for chest pain     omeprazole 20 MG capsule  Commonly known as:  PRILOSEC  Take 20 mg by mouth daily.     oxyCODONE 5 MG immediate release tablet  Commonly known as:  Oxy IR/ROXICODONE  Take 1 tablet (5 mg total) by mouth every 8 (eight) hours as needed for severe pain.     ranitidine 300 MG capsule  Commonly known as:  ZANTAC  Take 300 mg by mouth every evening.     valACYclovir 1000 MG tablet  Commonly known as:  VALTREX  Take 1 tablet (1,000 mg total) by mouth at bedtime.     Vitamin D-3 1000 units Caps  Take 1,000 Units by mouth 2 (two) times daily.       Allergies  Allergen Reactions  . Zocor [Simvastatin] Other (See Comments)    Unknown reaction per pt   Follow-up Information    Follow up with [name], MD. Go on 09/04/2015.   Specialty:  Family Medicine   Why:  for re-evaluation@3 ;00pm with Sander Radon information:   [address] [place] [address] [phone]        The results of significant diagnostics from this hospitalization (including imaging, microbiology, ancillary and laboratory) are listed below for reference.    Significant Diagnostic Studies: No results found.  Microbiology: No results found for this or any previous visit (from the past 240 hour(s)).   Labs: Basic Metabolic Panel:  Recent Labs Lab 08/31/15 1527 09/01/15 0454  NA 135 137  K 4.2 3.9  CL 102 109  CO2 21* 20*  GLUCOSE 119* 152*  BUN 19 20  CREATININE 1.72* 1.72*  CALCIUM 9.8 8.5*   Liver Function  Tests:  Recent Labs Lab 08/31/15 1527  AST 28  ALT 18  ALKPHOS 107  BILITOT 0.4  PROT 7.9  ALBUMIN 3.8    Recent Labs Lab 08/31/15 1527  LIPASE 26   No results for input(s): AMMONIA in the last 168 hours. CBC:  Recent Labs Lab 08/31/15 1527 09/01/15 0454  WBC 9.2 7.0  HGB 14.1 11.6*  HCT 41.4 35.3*  MCV 84.5 84.0  PLT 207 168   Cardiac Enzymes: No results for input(s): CKTOTAL, CKMB, CKMBINDEX, TROPONINI in the last 168 hours. BNP: BNP (last 3 results)  Recent Labs  09/01/15 0454  BNP 220.8*    ProBNP (last 3 results) No results for input(s): PROBNP in the last 8760 hours.  CBG: No results for input(s): GLUCAP in the last 168 hours.     SignedCalvert Cantor, MD [facility] 09/01/2015, 11:43 AM

## 2015-09-01 NOTE — Progress Notes (Signed)
Occupational Therapy Evaluation Patient Details Name: Craig [name] MRN: [id number] DOB: Aug 03, 1919 Today's Date: 09/01/2015    History of Present Illness 82 y.o. male with PMH of hypertension, hyperlipidemia, asthma, hypothyroidism, gout, pacemaker placement, mitral valve insufficiency, CAD, PTSD, chronic kidney disease-stage III, systolic congestive heart failure with EF for 45-50 percent, atrial fibrillation on Eliquis, BPH, who presents with pain over left flank and left lower abdomen and diagnosed with shingles.   Clinical Impression   Patient presents to OT at or very close to baseline with ADLs. He reports he will have supervision/assistance from his daughter at home. No further OT needs. OT will sign off.    Follow Up Recommendations  No OT follow up;Supervision/Assistance - 24 hour    Equipment Recommendations  None recommended by OT    Recommendations for Other Services       Precautions / Restrictions Precautions Precautions: Other (comment) (contact, airborne) Precaution Comments: shingles Restrictions Weight Bearing Restrictions: No      Mobility Bed Mobility Overal bed mobility: Modified Independent                Transfers Overall transfer level: Needs assistance Equipment used: Rolling walker (2 wheeled) Transfers: Sit to/from Stand Sit to Stand: Supervision              Balance                                            ADL Overall ADL's : Needs assistance/impaired Eating/Feeding: Independent;Bed level   Grooming: Wash/dry hands;Wash/dry face;Supervision/safety;Standing   Upper Body Bathing: Set up;Sitting   Lower Body Bathing: Supervison/ safety;Sit to/from stand   Upper Body Dressing : Set up;Sitting   Lower Body Dressing: Supervision/safety;Sit to/from stand   Toilet Transfer: Supervision/safety;Ambulation;Regular Toilet;RW   Toileting- Clothing Manipulation and Hygiene: Supervision/safety;Sit to/from  stand       Functional mobility during ADLs: Supervision/safety;Rolling walker General ADL Comments: Patient agreeable to OT. Educated on role of OT. Patient got OOB, donned shoes, ambulated to/from bathroom, groomed at sink, and toileted during session. He reports he is at baseline with ADLs and that his daughter will be staying with him at discharge.     Vision     Perception     Praxis      Pertinent Vitals/Pain Pain Assessment: No/denies pain     Hand Dominance Right   Extremity/Trunk Assessment Upper Extremity Assessment Upper Extremity Assessment: Overall WFL for tasks assessed   Lower Extremity Assessment Lower Extremity Assessment: Defer to PT evaluation       Communication Communication Communication: No difficulties   Cognition Arousal/Alertness: Awake/alert Behavior During Therapy: WFL for tasks assessed/performed Overall Cognitive Status: Within Functional Limits for tasks assessed                     General Comments       Exercises       Shoulder Instructions      Home Living Family/patient expects to be discharged to:: Private residence Living Arrangements: Alone;Children Available Help at Discharge: Available PRN/intermittently Type of Home: House Home Access: Stairs to enter Entrance Stairs-Number of Steps: 4   Home Layout: One level     Bathroom Shower/Tub: Other (comment) (pt sponge bathes at sink)   Firefighter: Standard     Home Equipment: Cane - single point;Walker - 2 wheels;Wheelchair - manual  Prior Functioning/Environment Level of Independence: Independent with assistive device(s);Needs assistance    ADL's / Homemaking Assistance Needed: daughter has been assisting with IADLs over the last week or two   Comments: daughter lives in [place] and has a family; has been going back and forth    OT Diagnosis: Generalized weakness   OT Problem List: Decreased activity tolerance   OT  Treatment/Interventions:      OT Goals(Current goals can be found in the care plan section) Acute Rehab OT Goals Patient Stated Goal: to go home OT Goal Formulation: All assessment and education complete, DC therapy  OT Frequency:     Barriers to D/C:            Co-evaluation              End of Session Equipment Utilized During Treatment: Rolling walker Nurse Communication: Other (comment) (discussed airborne precautions with RN prior to eval)  Activity Tolerance: Patient tolerated treatment well Patient left: in bed;with call bell/phone within reach   Time: 0454-0981 OT Time Calculation (min): 33 min Charges:  OT General Charges $OT Visit: 1 Procedure OT Evaluation $OT Eval Low Complexity: 1 Procedure G-Codes: OT G-codes **NOT FOR INPATIENT CLASS** Functional Assessment Tool Used: clinical judgment Functional Limitation: Self care Self Care Current Status (X9147): At least 1 percent but less than 20 percent impaired, limited or restricted Self Care Goal Status (W2956): At least 1 percent but less than 20 percent impaired, limited or restricted Self Care Discharge Status [phone]): At least 1 percent but less than 20 percent impaired, limited or restricted  Hanny Elsberry A 09/01/2015, 10:02 AM

## 2015-09-01 NOTE — Evaluation (Signed)
Physical Therapy Evaluation and D/C Patient Details Name: Craig [name] MRN: [id number] DOB: [birth year] Today's Date: 09/01/2015   History of Present Illness  82 y.o. male with PMH of hypertension, hyperlipidemia, asthma, hypothyroidism, gout, pacemaker placement, mitral valve insufficiency, CAD, PTSD, chronic kidney disease-stage III, systolic congestive heart failure with EF for 45-50 percent, atrial fibrillation on Eliquis, BPH, who presents with pain over left flank and left lower abdomen and diagnosed with shingles.  Clinical Impression  Pt admitted with above diagnosis. Pt currently with functional limitations due to the deficits listed below (see PT Problem List). Pt with fairly good safety with RW.  Pt informed he should use RW at home and he says he uses his "stick" most often.  Pt aware that PT recommends RW.  Pt states daughter will assist prn at home but then goes on to say that she is not there on the weekends.  Recommend HHPT safety evaluation with pt stating he doesn't think he needs it.  Will contact CM and hopeful that pt will agree as his MD is discharging him today.   Pt will benefit from skilled PT at home to increase their independence and safety with mobility.  D/c PT due to d/c home.      Follow Up Recommendations Home health PT;Supervision - Intermittent    Equipment Recommendations  None recommended by PT    Recommendations for Other Services       Precautions / Restrictions Precautions Precautions: Other (comment) (contact, airborne) Precaution Comments: shingles Restrictions Weight Bearing Restrictions: No      Mobility  Bed Mobility Overal bed mobility: Modified Independent                Transfers Overall transfer level: Needs assistance Equipment used: Rolling walker (2 wheeled) Transfers: Sit to/from Stand Sit to Stand: Supervision            Ambulation/Gait Ambulation/Gait assistance: Supervision Ambulation Distance (Feet): 85  Feet Assistive device: Rolling walker (2 wheeled) Gait Pattern/deviations: Step-through pattern;Decreased stride length   Gait velocity interpretation: <1.8 ft/sec, indicative of risk for recurrent falls General Gait Details: Pt ambulated with RW with good stability overall.  Pt states he likes to use his "stick". Allowed pt to ambulate holding onto IV pole and pt also stable holding it but did better with rW. Informed pt he would be safer to use rW at home and pt verbalizes understanding.    Stairs            Wheelchair Mobility    Modified Rankin (Stroke Patients Only)       Balance Overall balance assessment: Needs assistance Sitting-balance support: No upper extremity supported;Feet supported Sitting balance-Leahy Scale: Fair     Standing balance support: Single extremity supported;During functional activity Standing balance-Leahy Scale: Fair Standing balance comment: can balance with UE support of 1 extremity with supervision.                High Level Balance Comments: Ambulated in room with obstacles with RW with good overall stability.               Pertinent Vitals/Pain Pain Assessment: No/denies pain  VSS    Home Living Family/patient expects to be discharged to:: Private residence Living Arrangements: Alone;Children Available Help at Discharge: Available PRN/intermittently Type of Home: House Home Access: Stairs to enter   Entrance Stairs-Number of Steps: 4 Home Layout: One level Home Equipment: Cane - single point;Walker - 2 wheels;Wheelchair - manual  Prior Function Level of Independence: Independent with assistive device(s);Needs assistance      ADL's / Homemaking Assistance Needed: daughter has been assisting with IADLs over the last week or two  Comments: daughter lives in [place] and has a family; has been going back and forth     Hand Dominance   Dominant Hand: Right    Extremity/Trunk Assessment   Upper Extremity  Assessment: Defer to OT evaluation           Lower Extremity Assessment: Generalized weakness         Communication   Communication: No difficulties  Cognition Arousal/Alertness: Awake/alert Behavior During Therapy: WFL for tasks assessed/performed Overall Cognitive Status: Within Functional Limits for tasks assessed                      General Comments      Exercises        Assessment/Plan    PT Assessment All further PT needs can be met in the next venue of care  PT Diagnosis Generalized weakness   PT Problem List Decreased activity tolerance;Decreased balance;Decreased mobility  PT Treatment Interventions DME instruction;Gait training;Functional mobility training;Therapeutic activities;Balance training;Patient/family education   PT Goals (Current goals can be found in the Care Plan section) Acute Rehab PT Goals Patient Stated Goal: to go home PT Goal Formulation: All assessment and education complete, DC therapy    Frequency Min 3X/week   Barriers to discharge        Co-evaluation               End of Session Equipment Utilized During Treatment: Gait belt Activity Tolerance: Patient limited by fatigue Patient left: in bed;with call bell/phone within reach;with bed alarm set Nurse Communication: Mobility status    Functional Assessment Tool Used: clinical judgment Functional Limitation: Mobility: Walking and moving around Mobility: Walking and Moving Around Current Status ([id number]): At least 1 percent but less than 20 percent impaired, limited or restricted Mobility: Walking and Moving Around Goal Status [phone]): At least 1 percent but less than 20 percent impaired, limited or restricted Mobility: Walking and Moving Around Discharge Status [phone]): At least 1 percent but less than 20 percent impaired, limited or restricted    Time: 0223-3612 PT Time Calculation (min) (ACUTE ONLY): 19 min   Charges:   PT Evaluation $PT Eval Low  Complexity: 1 Procedure     PT G Codes:   PT G-Codes **NOT FOR INPATIENT CLASS** Functional Assessment Tool Used: clinical judgment Functional Limitation: Mobility: Walking and moving around Mobility: Walking and Moving Around Current Status (A4497): At least 1 percent but less than 20 percent impaired, limited or restricted Mobility: Walking and Moving Around Goal Status [phone]): At least 1 percent but less than 20 percent impaired, limited or restricted Mobility: Walking and Moving Around Discharge Status [phone]): At least 1 percent but less than 20 percent impaired, limited or restricted    Denice Paradise 09/01/2015, 2:55 PM [facility] Acute Rehabilitation [phone] (pager)

## 2015-09-01 NOTE — Discharge Instructions (Signed)
If you get constipated from the Oxycodone, take Miralax or Dulcolax as recommended on the instructions   Shingles Shingles, which is also known as herpes zoster, is an infection that causes a painful skin rash and fluid-filled blisters. Shingles is not related to genital herpes, which is a sexually transmitted infection.   Shingles only develops in people who:  Have had chickenpox.  Have received the chickenpox vaccine. (This is rare.) CAUSES Shingles is caused by varicella-zoster virus (VZV). This is the same virus that causes chickenpox. After exposure to VZV, the virus stays in the body in an inactive (dormant) state. Shingles develops if the virus reactivates. This can happen many years after the initial exposure to VZV. It is not known what causes this virus to reactivate. RISK FACTORS People who have had chickenpox or received the chickenpox vaccine are at risk for shingles. Infection is more common in people who:  Are older than age 33.  Have a weakened defense (immune) system, such as those with HIV, AIDS, or cancer.  Are taking medicines that weaken the immune system, such as transplant medicines.  Are under great stress. SYMPTOMS Early symptoms of this condition include itching, tingling, and pain in an area on your skin. Pain may be described as burning, stabbing, or throbbing. A few days or weeks after symptoms start, a painful red rash appears, usually on one side of the body in a bandlike or beltlike pattern. The rash eventually turns into fluid-filled blisters that break open, scab over, and dry up in about 2-3 weeks. At any time during the infection, you may also develop:  A fever.  Chills.  A headache.  An upset stomach. DIAGNOSIS This condition is diagnosed with a skin exam. Sometimes, skin or fluid samples are taken from the blisters before a diagnosis is made. These samples are examined under a microscope or sent to a lab for testing. TREATMENT There is no  specific cure for this condition. Your health care provider will probably prescribe medicines to help you manage pain, recover more quickly, and avoid long-term problems. Medicines may include:  Antiviral drugs.  Anti-inflammatory drugs.  Pain medicines. If the area involved is on your face, you may be referred to a specialist, such as an eye doctor (ophthalmologist) or an ear, nose, and throat (ENT) doctor to help you avoid eye problems, chronic pain, or disability. HOME CARE INSTRUCTIONS Medicines  Take medicines only as directed by your health care provider.  Apply an anti-itch or numbing cream to the affected area as directed by your health care provider. Blister and Rash Care  Take a cool bath or apply cool compresses to the area of the rash or blisters as directed by your health care provider. This may help with pain and itching.  Keep your rash covered with a loose bandage (dressing). Wear loose-fitting clothing to help ease the pain of material rubbing against the rash.  Keep your rash and blisters clean with mild soap and cool water or as directed by your health care provider.  Check your rash every day for signs of infection. These include redness, swelling, and pain that lasts or increases.  Do not pick your blisters.  Do not scratch your rash. General Instructions  Rest as directed by your health care provider.  Keep all follow-up visits as directed by your health care provider. This is important.  Until your blisters scab over, your infection can cause chickenpox in people who have never had it or been vaccinated against it.  To prevent this from happening, avoid contact with other people, especially:  Babies.  Pregnant women.  Children who have eczema.  Elderly people who have transplants.  People who have chronic illnesses, such as leukemia or AIDS. SEEK MEDICAL CARE IF:  Your pain is not relieved with prescribed medicines.  Your pain does not get better  after the rash heals.  Your rash looks infected. Signs of infection include redness, swelling, and pain that lasts or increases. SEEK IMMEDIATE MEDICAL CARE IF:  The rash is on your face or nose.  You have facial pain, pain around your eye area, or loss of feeling on one side of your face.  You have ear pain or you have ringing in your ear.  You have loss of taste.  Your condition gets worse.   This information is not intended to replace advice given to you by your health care provider. Make sure you discuss any questions you have with your health care provider.   Document Released: 07/22/2005 Document Revised: 08/12/2014 Document Reviewed: 06/02/2014 Elsevier Interactive Patient Education [employer]

## 2015-09-01 NOTE — Progress Notes (Signed)
1715 discharge instructions given to pt and daughter.Verbalized understanding  Wheeled to lobby by NT

## 2015-09-02 LAB — UREA NITROGEN, URINE: UREA NITROGEN UR: 589 mg/dL

## 2015-09-04 DIAGNOSIS — B029 Zoster without complications: Secondary | ICD-10-CM | POA: Diagnosis not present

## 2015-09-04 DIAGNOSIS — I5022 Chronic systolic (congestive) heart failure: Secondary | ICD-10-CM | POA: Diagnosis not present

## 2015-09-04 DIAGNOSIS — D649 Anemia, unspecified: Secondary | ICD-10-CM | POA: Diagnosis not present

## 2015-09-04 DIAGNOSIS — R7881 Bacteremia: Secondary | ICD-10-CM | POA: Diagnosis not present

## 2015-09-04 DIAGNOSIS — N183 Chronic kidney disease, stage 3 (moderate): Secondary | ICD-10-CM | POA: Diagnosis not present

## 2015-09-05 ENCOUNTER — Ambulatory Visit (INDEPENDENT_AMBULATORY_CARE_PROVIDER_SITE_OTHER): Payer: Medicare Other | Admitting: *Deleted

## 2015-09-05 DIAGNOSIS — I441 Atrioventricular block, second degree: Secondary | ICD-10-CM

## 2015-09-05 NOTE — Progress Notes (Signed)
Remote pacemaker transmission.   

## 2015-09-06 LAB — CULTURE, BLOOD (ROUTINE X 2): Culture: NO GROWTH

## 2015-09-23 ENCOUNTER — Encounter (HOSPITAL_COMMUNITY): Payer: Self-pay | Admitting: *Deleted

## 2015-09-23 ENCOUNTER — Emergency Department (HOSPITAL_COMMUNITY)
Admission: EM | Admit: 2015-09-23 | Discharge: 2015-09-23 | Disposition: A | Discharge status: Home / Self Care | Payer: Medicare Other | Attending: Emergency Medicine | Admitting: Emergency Medicine

## 2015-09-23 DIAGNOSIS — I129 Hypertensive chronic kidney disease with stage 1 through stage 4 chronic kidney disease, or unspecified chronic kidney disease: Secondary | ICD-10-CM | POA: Insufficient documentation

## 2015-09-23 DIAGNOSIS — Z8659 Personal history of other mental and behavioral disorders: Secondary | ICD-10-CM | POA: Diagnosis not present

## 2015-09-23 DIAGNOSIS — J45909 Unspecified asthma, uncomplicated: Secondary | ICD-10-CM | POA: Insufficient documentation

## 2015-09-23 DIAGNOSIS — H9190 Unspecified hearing loss, unspecified ear: Secondary | ICD-10-CM | POA: Insufficient documentation

## 2015-09-23 DIAGNOSIS — I5022 Chronic systolic (congestive) heart failure: Secondary | ICD-10-CM | POA: Diagnosis not present

## 2015-09-23 DIAGNOSIS — Z9119 Patient's noncompliance with other medical treatment and regimen: Secondary | ICD-10-CM | POA: Diagnosis not present

## 2015-09-23 DIAGNOSIS — E785 Hyperlipidemia, unspecified: Secondary | ICD-10-CM | POA: Insufficient documentation

## 2015-09-23 DIAGNOSIS — Z9889 Other specified postprocedural states: Secondary | ICD-10-CM | POA: Insufficient documentation

## 2015-09-23 DIAGNOSIS — I4891 Unspecified atrial fibrillation: Secondary | ICD-10-CM | POA: Diagnosis not present

## 2015-09-23 DIAGNOSIS — N183 Chronic kidney disease, stage 3 (moderate): Secondary | ICD-10-CM | POA: Insufficient documentation

## 2015-09-23 DIAGNOSIS — M109 Gout, unspecified: Secondary | ICD-10-CM | POA: Diagnosis not present

## 2015-09-23 DIAGNOSIS — Z950 Presence of cardiac pacemaker: Secondary | ICD-10-CM | POA: Diagnosis not present

## 2015-09-23 DIAGNOSIS — Z87891 Personal history of nicotine dependence: Secondary | ICD-10-CM | POA: Insufficient documentation

## 2015-09-23 DIAGNOSIS — R1032 Left lower quadrant pain: Secondary | ICD-10-CM | POA: Diagnosis not present

## 2015-09-23 DIAGNOSIS — B0229 Other postherpetic nervous system involvement: Secondary | ICD-10-CM | POA: Insufficient documentation

## 2015-09-23 DIAGNOSIS — E039 Hypothyroidism, unspecified: Secondary | ICD-10-CM | POA: Diagnosis not present

## 2015-09-23 DIAGNOSIS — I2510 Atherosclerotic heart disease of native coronary artery without angina pectoris: Secondary | ICD-10-CM | POA: Insufficient documentation

## 2015-09-23 DIAGNOSIS — Z79899 Other long term (current) drug therapy: Secondary | ICD-10-CM | POA: Diagnosis not present

## 2015-09-23 LAB — COMPREHENSIVE METABOLIC PANEL
ALT: 17 U/L (ref 17–63)
ANION GAP: 11 (ref 5–15)
AST: 26 U/L (ref 15–41)
Albumin: 3.7 g/dL (ref 3.5–5.0)
Alkaline Phosphatase: 87 U/L (ref 38–126)
BUN: 23 mg/dL — ABNORMAL HIGH (ref 6–20)
CHLORIDE: 99 mmol/L — AB (ref 101–111)
CO2: 23 mmol/L (ref 22–32)
CREATININE: 1.68 mg/dL — AB (ref 0.61–1.24)
Calcium: 9.3 mg/dL (ref 8.9–10.3)
GFR, EST AFRICAN AMERICAN: 38 mL/min — AB (ref >60)
GFR, EST NON AFRICAN AMERICAN: 33 mL/min — AB (ref >60)
Glucose, Bld: 113 mg/dL — ABNORMAL HIGH (ref 65–99)
Potassium: 4.3 mmol/L (ref 3.5–5.1)
SODIUM: 133 mmol/L — AB (ref 135–145)
Total Bilirubin: 0.9 mg/dL (ref 0.3–1.2)
Total Protein: 6.8 g/dL (ref 6.5–8.1)

## 2015-09-23 LAB — LIPASE, BLOOD: LIPASE: 27 U/L (ref 11–51)

## 2015-09-23 LAB — CBC WITH DIFFERENTIAL/PLATELET
Basophils Absolute: 0 10*3/uL (ref 0.0–0.1)
Basophils Relative: 0 %
EOS ABS: 0.4 10*3/uL (ref 0.0–0.7)
EOS PCT: 5 %
HCT: 38.4 % — ABNORMAL LOW (ref 39.0–52.0)
Hemoglobin: 13 g/dL (ref 13.0–17.0)
LYMPHS ABS: 2 10*3/uL (ref 0.7–4.0)
LYMPHS PCT: 26 %
MCH: 28.6 pg (ref 26.0–34.0)
MCHC: 33.9 g/dL (ref 30.0–36.0)
MCV: 84.4 fL (ref 78.0–100.0)
MONOS PCT: 11 %
Monocytes Absolute: 0.8 10*3/uL (ref 0.1–1.0)
Neutro Abs: 4.4 10*3/uL (ref 1.7–7.7)
Neutrophils Relative %: 58 %
PLATELETS: 218 10*3/uL (ref 150–400)
RBC: 4.55 MIL/uL (ref 4.22–5.81)
RDW: 16.9 % — ABNORMAL HIGH (ref 11.5–15.5)
WBC: 7.6 10*3/uL (ref 4.0–10.5)

## 2015-09-23 LAB — URINALYSIS, ROUTINE W REFLEX MICROSCOPIC
Bilirubin Urine: NEGATIVE
GLUCOSE, UA: NEGATIVE mg/dL
Hgb urine dipstick: NEGATIVE
Ketones, ur: NEGATIVE mg/dL
LEUKOCYTES UA: NEGATIVE
Nitrite: NEGATIVE
PROTEIN: NEGATIVE mg/dL
Specific Gravity, Urine: 1.014 (ref 1.005–1.030)
pH: 5.5 (ref 5.0–8.0)

## 2015-09-23 LAB — I-STAT CG4 LACTIC ACID, ED: LACTIC ACID, VENOUS: 1.61 mmol/L (ref 0.5–2.0)

## 2015-09-23 MED ORDER — OXYCODONE HCL 5 MG PO TABS
5.0000 mg | ORAL_TABLET | Freq: Once | ORAL | Status: AC
  Administered 2015-09-23: 5 mg via ORAL
  Filled 2015-09-23: qty 1

## 2015-09-23 MED ORDER — OXYCODONE HCL 5 MG PO TABS: 5.0000 mg | ORAL_TABLET | ORAL | Status: DC | PRN

## 2015-09-23 NOTE — ED Notes (Signed)
Oxycodone given to pt - Dr Clydene Pugh aware pt's daughter has not arrived as of yet and pt c/o pain continues.

## 2015-09-23 NOTE — ED Notes (Signed)
Pt presents via [facility] from home c/o LLQ abdominal pain radiating to left flank/back x 1 day.  Pt denies diarrhea or urinary symptoms.  EMs reports daughter usually lives with pt, d/t family death daughter is currently out of town.  Hx: dimentia.  BP-130/80 HR-72.  Pt currently alert x 3, NAD.  Pt reports movement increases pain.  Reports having this pain before.

## 2015-09-23 NOTE — ED Notes (Signed)
Telepsych completed.  

## 2015-09-23 NOTE — ED Notes (Signed)
LM for pt's daughter inquiring how much longer it might be before she arrives to pick up pt.

## 2015-09-23 NOTE — ED Notes (Addendum)
Pt has pressed the call bell x 5 in approx 15 min. Continues to ask why he is being d/c'd when he continues to experience pain. Advised pt he has postherpetic neuralgia which does not require hospitalization and that we are waiting on his daughter to come pick him up. Voices understanding then repeats the same questions.

## 2015-09-23 NOTE — ED Provider Notes (Signed)
CSN: [id number]     Arrival date & time 09/23/15  0754 History   First MD Initiated Contact with Patient 09/23/15 0756     Chief Complaint  Patient presents with  . Abdominal Pain     (Consider location/radiation/quality/duration/timing/severity/associated sxs/prior Treatment) Patient is a 82 y.o. male presenting with abdominal pain. The history is provided by the patient.  Abdominal Pain Pain location:  LLQ Pain quality: burning   Pain radiates to:  L flank and back Pain severity:  Moderate Onset quality:  Gradual Duration:  2 weeks Timing:  Intermittent Progression:  Waxing and waning Chronicity:  Recurrent Context comment:  Recent herpes zoster in same distribution as pain Relieved by: narcotic pain meds. Worsened by:  Movement and palpation Ineffective treatments:  None tried Associated symptoms: no fever, no hematochezia and no vomiting   Risk factors: being elderly and recent hospitalization     Past Medical History  Diagnosis Date  . Mitral insufficiency     chronically moderate to severe  . Hypertension   . Hyperlipidemia   . 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 Coronary artery disease     a. Remote lateral infarct;  b. 09/2011 MV: large dense inferolat/lat scar - ? very slight peri-infarcrt isch.  No signif change c/w 09/2010 scan.  . Raynaud's disease /phenomenon   . Hard of hearing     bilaterally  . Gout   . PTSD (post-traumatic stress disorder)     "being treated for it now but not a big degree"  . Asthma   . Noncompliance with medication regimen   . Chronic systolic CHF (congestive heart failure) (HCC)     a. 10/2011 Echo: EF 40-45%, inf AK, PASP .  Marland Kitchen Persistent atrial fibrillation (HCC)   . CKD (chronic kidney disease), stage III   . Hypothyroidism    Past Surgical History  Procedure Laterality Date  . Prostatectomy    . Tonsillectomy    . Inguinal hernia repair      left  . Appendectomy    . Cardiac catheterization  2003    SHOWED  OCCLUSION OF THE LEFT CIRCUMFLEX CORONARY ARTERY  . Pacemaker insertion  03/28/14    STJ single chamber pacemaker implanted by Dr Graciela Husbands for symptomatic bradycardia  . Left heart catheterization with coronary angiogram N/A 08/29/2011    Procedure: LEFT HEART CATHETERIZATION WITH CORONARY ANGIOGRAM;  Surgeon: Kathleene Hazel, MD;  Location: [facility] CATH LAB;  Service: Cardiovascular;  Laterality: N/A;  . Permanent pacemaker insertion N/A 03/28/2014    Procedure: PERMANENT PACEMAKER INSERTION;  Surgeon: Duke Salvia, MD;  Location: [facility] CATH LAB;  Service: Cardiovascular;  Laterality: N/A;   Family History  Problem Relation Age of Onset  . Heart attack Brother   . Alcohol abuse Father     died in his late 58's  . Other Mother     died in her late 65's - old age.   Social History  Substance Use Topics  . Smoking status: Former Smoker -- 1.50 packs/day for 20 years    Quit date: 12/12/1970  . Smokeless tobacco: Never Used  . Alcohol Use: No    Review of Systems  Constitutional: Negative for fever.  Gastrointestinal: Positive for abdominal pain. Negative for vomiting and hematochezia.  All other systems reviewed and are negative.     Allergies  Zocor  Home Medications   Prior to Admission medications   Medication Sig Start Date End Date Taking?  Authorizing Provider  acetaminophen (TYLENOL) 500 MG tablet Take 500 mg by mouth every 6 (six) hours as needed for moderate pain.    Historical Provider, MD  allopurinol (ZYLOPRIM) 300 MG tablet Take 450 mg by mouth daily.     Historical Provider, MD  amLODipine (NORVASC) 5 MG tablet Take 5 mg by mouth daily.    Historical Provider, MD  apixaban (ELIQUIS) 2.5 MG TABS tablet Take 1 tablet (2.5 mg total) by mouth 2 (two) times daily. 08/12/14   Peter M [place], MD  Cholecalciferol (VITAMIN D-3) 1000 UNITS CAPS Take 1,000 Units by mouth 2 (two) times daily.     Historical Provider, MD  finasteride (PROSCAR) 5 MG tablet Take 5 mg by mouth every  evening.     Historical Provider, MD  furosemide (LASIX) 20 MG tablet Take 2 tablets (40 mg total) by mouth daily. 09/02/15   Calvert Cantor, MD  isosorbide mononitrate (IMDUR) 60 MG 24 hr tablet Take 1 tablet (60 mg total) by mouth daily. 05/31/15   Peter M [place], MD  levothyroxine (SYNTHROID, LEVOTHROID) 50 MCG tablet Take 50 mcg by mouth daily before breakfast.    Historical Provider, MD  nitroGLYCERIN (NITROSTAT) 0.4 MG SL tablet Place 1 tablet (0.4 mg total) under the tongue every 5 (five) minutes as needed. x3 doses as needed for chest pain 10/18/12   Ok Anis, NP  omeprazole (PRILOSEC) 20 MG capsule Take 20 mg by mouth daily.     Historical Provider, MD  oxyCODONE (OXY IR/ROXICODONE) 5 MG immediate release tablet Take 1 tablet (5 mg total) by mouth every 8 (eight) hours as needed for severe pain. 09/01/15   Calvert Cantor, MD  ranitidine (ZANTAC) 300 MG capsule Take 300 mg by mouth every evening.    Historical Provider, MD  valACYclovir (VALTREX) 1000 MG tablet Take 1 tablet (1,000 mg total) by mouth at bedtime. 09/01/15   Calvert Cantor, MD   BP 104/75 mmHg  Pulse 64  Temp(Src) 97.8 F (36.6 C) (Oral)  Resp 18  SpO2 100% Physical Exam  Constitutional: He is oriented to person, place, and time. He appears well-developed and well-nourished. No distress.  HENT:  Head: Normocephalic and atraumatic.  Eyes: Conjunctivae are normal.  Neck: Neck supple. No tracheal deviation present.  Cardiovascular: Normal rate and regular rhythm.   Pulmonary/Chest: Effort normal. No respiratory distress.  Abdominal: Soft. He exhibits no distension. There is no tenderness. There is no rigidity, no rebound, no guarding and no CVA tenderness.  Neurological: He is alert and oriented to person, place, and time.  Skin: Skin is warm and dry. No rash noted.  Psychiatric: He has a normal mood and affect.    ED Course  Procedures (including critical care time) Labs Review Labs Reviewed  CBC WITH  DIFFERENTIAL/PLATELET - Abnormal; Notable for the following:    HCT 38.4 (*)    RDW 16.9 (*)    All other components within normal limits  COMPREHENSIVE METABOLIC PANEL - Abnormal; Notable for the following:    Sodium 133 (*)    Chloride 99 (*)    Glucose, Bld 113 (*)    BUN 23 (*)    Creatinine, Ser 1.68 (*)    GFR calc non Af Amer 33 (*)    GFR calc Af Amer 38 (*)    All other components within normal limits  LIPASE, BLOOD  URINALYSIS, ROUTINE W REFLEX MICROSCOPIC (NOT AT [facility])  I-STAT CG4 LACTIC ACID, ED    Imaging Review No  results found. I have personally reviewed and evaluated these images and lab results as part of my medical decision-making.   EKG Interpretation None      MDM   Final diagnoses:  Postherpetic neuralgia    82 y.o. male presents with ongoing left low back and LLQ pain described as burning. I saw Pt previously for zoster outbreak which has since resolved. Labs reassuring, no abdominal tenderness on exam. Distribution and quality are c/w postherpetic neuralgia. Pt's daughter is in [place] for a funeral and is unable to come get him currently. I discussed treatment options on the phone including maybe switching patient off of narcotic pain meds and transitioning to low dose neurontin but daughter wishes to continue narcotic therapy because it appears to be working well. Pt was given small amount of oxycodone for home pending follow up with primary care for recheck and further medication recommendations this week. Plan to follow up with PCP as needed and return precautions discussed for worsening or new concerning symptoms.     Lyndal Pulley, MD 09/23/15 Mikle Bosworth

## 2015-09-23 NOTE — ED Notes (Signed)
MD at bedside. 

## 2015-09-23 NOTE — Discharge Instructions (Signed)
Postherpetic Neuralgia   Postherpetic neuralgia (PHN) is nerve pain that occurs after a shingles infection. Shingles is a painful rash that appears on one side of the body, usually on your trunk or face. Shingles is caused by the varicella-zoster virus. This is the same virus that causes chickenpox. In people who have had chickenpox, the virus can resurface years later and cause shingles.   You may have PHN if you continue to have pain for 3 months after your shingles rash has gone away. PHN appears in the same area where you had the shingles rash. For most people, PHN goes away within 1 year.   Getting a vaccination for shingles can prevent PHN. This vaccine is recommended for people older than 50. It may prevent shingles and may also lower your risk of PHN if you do get shingles.   CAUSES   PHN is caused by damage to your nerves from the varicella-zoster virus. This damage makes your nerves overly sensitive.   RISK FACTORS   Aging is the biggest risk factor for developing PHN. Most people who get PHN are older than 60. Other risk factors include:   Having very bad pain before your shingles rash starts.   Having a very bad rash.   Having shingles in the nerve that supplies your face and eye (trigeminal nerve).  SIGNS AND SYMPTOMS   Pain is the main symptom of PHN. The pain is often very bad and may be described as stabbing, burning, or feeling like an electric shock. The pain may come and go or may be there all the time. Pain may be triggered by light touches on the skin or changes in temperature. You may have itching along with the pain.   DIAGNOSIS   Your health care provider may diagnose PHN based on your symptoms and your history of shingles. Lab studies and other diagnostic tests are usually not needed.   TREATMENT   There is no cure for PHN. Treatment for PHN will focus on pain relief. Over-the-counter pain relievers do not usually relieve PHN pain. You may need to work with a pain specialist. Treatment may  include:   Antidepressant medicines to help with pain and improve sleep.   Antiseizure medicines to relieve nerve pain.   Strong pain relievers (opioids).   A numbing patch worn on the skin (lidocaine patch).  HOME CARE INSTRUCTIONS   It may take a long time to recover from PHN. Work closely with your health care provider, and have a good support system at home.   Take all medicines as directed by your health care provider.   Wear loose, comfortable clothing.   Cover sensitive areas with a dressing to reduce friction from clothing rubbing on the area.   If cold does not make your pain worse, try applying a cool compress or cooling gel pack to the area.   Talk to your health care provider if you feel depressed or desperate. Living with long-term pain can be depressing.  SEEK MEDICAL CARE IF:   Your medicine is not helping.   You are struggling to manage your pain at home.  This information is not intended to replace advice given to you by your health care provider. Make sure you discuss any questions you have with your health care provider.   Document Released: 10/12/2002 Document Revised: 08/12/2014 Document Reviewed: 07/13/2013   Elsevier Interactive Patient Education 2016 [employer]

## 2015-09-23 NOTE — ED Notes (Signed)
Pt dx with shingles in January, no visible rash seen.

## 2015-09-23 NOTE — ED Notes (Signed)
Pt arrived to [facility] via stretcher wearing hospital gown. Pt's clothing noted to be lying on stretcher. Pt alert. Gave pt sandwich, apple sauce, orange juice and coffee as requested. Aware waiting for daughter to pick him up.

## 2015-09-23 NOTE — ED Notes (Signed)
IV removed from right FA - cath intact.

## 2015-09-23 NOTE — ED Notes (Signed)
pts daughter is Doristine Locks and can be contacted to pick pt up, she lives in [place]. Her phone # [phone]

## 2015-09-24 LAB — CUP PACEART REMOTE DEVICE CHECK
Battery Remaining Longevity: 137 mo
Battery Remaining Percentage: 95.5 %
Battery Voltage: 3.02 V
Brady Statistic RV Percent Paced: 62 %
Implantable Lead Model: 1948
Lead Channel Setting Pacing Amplitude: 1 V
Lead Channel Setting Pacing Pulse Width: 0.4 ms
Lead Channel Setting Sensing Sensitivity: 2 mV
MDC IDC LEAD IMPLANT DT: 20150824
MDC IDC LEAD LOCATION: 753860
MDC IDC MSMT LEADCHNL RV IMPEDANCE VALUE: 550 Ohm
MDC IDC MSMT LEADCHNL RV PACING THRESHOLD AMPLITUDE: 0.75 V
MDC IDC MSMT LEADCHNL RV PACING THRESHOLD PULSEWIDTH: 0.4 ms
MDC IDC MSMT LEADCHNL RV SENSING INTR AMPL: 10.9 mV
MDC IDC PG SERIAL: 7616716
MDC IDC SESS DTM: 20170131073741

## 2015-09-29 ENCOUNTER — Encounter: Payer: Self-pay | Admitting: Cardiology

## 2015-10-02 ENCOUNTER — Emergency Department (HOSPITAL_COMMUNITY)
Admission: EM | Admit: 2015-10-02 | Discharge: 2015-10-02 | Disposition: A | Discharge status: Home / Self Care | Payer: Medicare Other | Attending: Emergency Medicine | Admitting: Emergency Medicine

## 2015-10-02 ENCOUNTER — Encounter (HOSPITAL_COMMUNITY): Payer: Self-pay | Admitting: Emergency Medicine

## 2015-10-02 ENCOUNTER — Emergency Department (HOSPITAL_COMMUNITY): Payer: Medicare Other

## 2015-10-02 DIAGNOSIS — R109 Unspecified abdominal pain: Secondary | ICD-10-CM | POA: Diagnosis not present

## 2015-10-02 DIAGNOSIS — Z9119 Patient's noncompliance with other medical treatment and regimen: Secondary | ICD-10-CM | POA: Diagnosis not present

## 2015-10-02 DIAGNOSIS — I7300 Raynaud's syndrome without gangrene: Secondary | ICD-10-CM | POA: Diagnosis not present

## 2015-10-02 DIAGNOSIS — Z87891 Personal history of nicotine dependence: Secondary | ICD-10-CM | POA: Insufficient documentation

## 2015-10-02 DIAGNOSIS — H9193 Unspecified hearing loss, bilateral: Secondary | ICD-10-CM | POA: Insufficient documentation

## 2015-10-02 DIAGNOSIS — J45909 Unspecified asthma, uncomplicated: Secondary | ICD-10-CM | POA: Insufficient documentation

## 2015-10-02 DIAGNOSIS — Z8659 Personal history of other mental and behavioral disorders: Secondary | ICD-10-CM | POA: Insufficient documentation

## 2015-10-02 DIAGNOSIS — K5900 Constipation, unspecified: Secondary | ICD-10-CM | POA: Insufficient documentation

## 2015-10-02 DIAGNOSIS — M545 Low back pain: Secondary | ICD-10-CM | POA: Diagnosis present

## 2015-10-02 DIAGNOSIS — I2510 Atherosclerotic heart disease of native coronary artery without angina pectoris: Secondary | ICD-10-CM | POA: Insufficient documentation

## 2015-10-02 DIAGNOSIS — N183 Chronic kidney disease, stage 3 (moderate): Secondary | ICD-10-CM | POA: Insufficient documentation

## 2015-10-02 DIAGNOSIS — Z9889 Other specified postprocedural states: Secondary | ICD-10-CM | POA: Insufficient documentation

## 2015-10-02 DIAGNOSIS — Z950 Presence of cardiac pacemaker: Secondary | ICD-10-CM | POA: Diagnosis not present

## 2015-10-02 DIAGNOSIS — E039 Hypothyroidism, unspecified: Secondary | ICD-10-CM | POA: Insufficient documentation

## 2015-10-02 DIAGNOSIS — Z7901 Long term (current) use of anticoagulants: Secondary | ICD-10-CM | POA: Insufficient documentation

## 2015-10-02 DIAGNOSIS — M109 Gout, unspecified: Secondary | ICD-10-CM | POA: Insufficient documentation

## 2015-10-02 DIAGNOSIS — I129 Hypertensive chronic kidney disease with stage 1 through stage 4 chronic kidney disease, or unspecified chronic kidney disease: Secondary | ICD-10-CM | POA: Insufficient documentation

## 2015-10-02 DIAGNOSIS — Z79899 Other long term (current) drug therapy: Secondary | ICD-10-CM | POA: Diagnosis not present

## 2015-10-02 DIAGNOSIS — I5022 Chronic systolic (congestive) heart failure: Secondary | ICD-10-CM | POA: Diagnosis not present

## 2015-10-02 DIAGNOSIS — B0229 Other postherpetic nervous system involvement: Secondary | ICD-10-CM | POA: Insufficient documentation

## 2015-10-02 LAB — CBC WITH DIFFERENTIAL/PLATELET
BASOS ABS: 0 10*3/uL (ref 0.0–0.1)
BASOS PCT: 0 %
Eosinophils Absolute: 0.3 10*3/uL (ref 0.0–0.7)
Eosinophils Relative: 3 %
HEMATOCRIT: 37.3 % — AB (ref 39.0–52.0)
HEMOGLOBIN: 12.7 g/dL — AB (ref 13.0–17.0)
Lymphocytes Relative: 19 %
Lymphs Abs: 1.8 10*3/uL (ref 0.7–4.0)
MCH: 28.5 pg (ref 26.0–34.0)
MCHC: 34 g/dL (ref 30.0–36.0)
MCV: 83.8 fL (ref 78.0–100.0)
Monocytes Absolute: 0.8 10*3/uL (ref 0.1–1.0)
Monocytes Relative: 9 %
NEUTROS ABS: 6.3 10*3/uL (ref 1.7–7.7)
Neutrophils Relative %: 69 %
Platelets: 177 10*3/uL (ref 150–400)
RBC: 4.45 MIL/uL (ref 4.22–5.81)
RDW: 16.5 % — ABNORMAL HIGH (ref 11.5–15.5)
WBC: 9.2 10*3/uL (ref 4.0–10.5)

## 2015-10-02 LAB — COMPREHENSIVE METABOLIC PANEL
ALBUMIN: 3.6 g/dL (ref 3.5–5.0)
ALT: 19 U/L (ref 17–63)
ANION GAP: 12 (ref 5–15)
AST: 23 U/L (ref 15–41)
Alkaline Phosphatase: 82 U/L (ref 38–126)
BILIRUBIN TOTAL: 0.4 mg/dL (ref 0.3–1.2)
BUN: 20 mg/dL (ref 6–20)
CO2: 22 mmol/L (ref 22–32)
Calcium: 9.4 mg/dL (ref 8.9–10.3)
Chloride: 102 mmol/L (ref 101–111)
Creatinine, Ser: 1.51 mg/dL — ABNORMAL HIGH (ref 0.61–1.24)
GFR, EST AFRICAN AMERICAN: 44 mL/min — AB (ref >60)
GFR, EST NON AFRICAN AMERICAN: 38 mL/min — AB (ref >60)
GLUCOSE: 124 mg/dL — AB (ref 65–99)
POTASSIUM: 4.3 mmol/L (ref 3.5–5.1)
Sodium: 136 mmol/L (ref 135–145)
TOTAL PROTEIN: 6.4 g/dL — AB (ref 6.5–8.1)

## 2015-10-02 LAB — URINALYSIS, ROUTINE W REFLEX MICROSCOPIC
Bilirubin Urine: NEGATIVE
Glucose, UA: NEGATIVE mg/dL
Hgb urine dipstick: NEGATIVE
KETONES UR: NEGATIVE mg/dL
LEUKOCYTES UA: NEGATIVE
NITRITE: NEGATIVE
PH: 6 (ref 5.0–8.0)
PROTEIN: NEGATIVE mg/dL
SPECIFIC GRAVITY, URINE: 1.014 (ref 1.005–1.030)

## 2015-10-02 LAB — LIPASE, BLOOD: LIPASE: 25 U/L (ref 11–51)

## 2015-10-02 MED ORDER — OXYCODONE HCL 5 MG PO TABS
2.5000 mg | ORAL_TABLET | Freq: Once | ORAL | Status: AC
  Administered 2015-10-02: 2.5 mg via ORAL
  Filled 2015-10-02: qty 1

## 2015-10-02 MED ORDER — OXYCODONE HCL 5 MG PO TABS: 2.5000 mg | ORAL_TABLET | Freq: Four times a day (QID) | ORAL | Status: DC | PRN

## 2015-10-02 MED ORDER — LIDOCAINE 4 % EX CREA
TOPICAL_CREAM | Freq: Once | CUTANEOUS | Status: DC
  Filled 2015-10-02 (×2): qty 5

## 2015-10-02 NOTE — ED Notes (Signed)
Onset 3 weeks ago diagnosis with shingles and since then having lower back pain. Shingles are healed however concerned still having pain.

## 2015-10-02 NOTE — ED Provider Notes (Signed)
CSN: [id number]     Arrival date & time 10/02/15  2014 History   First MD Initiated Contact with Patient 10/02/15 2122     Chief Complaint  Patient presents with  . Back Pain     (Consider location/radiation/quality/duration/timing/severity/associated sxs/prior Treatment) HPI  82 year old male presents with continued left low back pain. Started with herpes zoster and rash 1 month ago. Rash gone but pain remains. Seen 1 week ago for same and given #6 5 mg oxycodone. Helps pain but also puts patient to sleep. No fever. No urinary symptoms. Pain is left sided. Some pain in LLQ although none now. Has complained of some abdominal pain, course is unclear. None now. Does not remember the last time he had a BM. Last oxycodone given earlier today but now pain is back.  Past Medical History  Diagnosis Date  . Mitral insufficiency     chronically moderate to severe  . Hypertension   . Hyperlipidemia   . 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 Coronary artery disease     a. Remote lateral infarct;  b. 09/2011 MV: large dense inferolat/lat scar - ? very slight peri-infarcrt isch.  No signif change c/w 09/2010 scan.  . Raynaud's disease /phenomenon   . Hard of hearing     bilaterally  . Gout   . PTSD (post-traumatic stress disorder)     "being treated for it now but not a big degree"  . Asthma   . Noncompliance with medication regimen   . Chronic systolic CHF (congestive heart failure) (HCC)     a. 10/2011 Echo: EF 40-45%, inf AK, PASP .  Marland Kitchen Persistent atrial fibrillation (HCC)   . CKD (chronic kidney disease), stage III   . Hypothyroidism    Past Surgical History  Procedure Laterality Date  . Prostatectomy    . Tonsillectomy    . Inguinal hernia repair      left  . Appendectomy    . Cardiac catheterization  2003    SHOWED OCCLUSION OF THE LEFT CIRCUMFLEX CORONARY ARTERY  . Pacemaker insertion  03/28/14    STJ single chamber pacemaker implanted by Dr Graciela Husbands for symptomatic  bradycardia  . Left heart catheterization with coronary angiogram N/A 08/29/2011    Procedure: LEFT HEART CATHETERIZATION WITH CORONARY ANGIOGRAM;  Surgeon: Kathleene Hazel, MD;  Location: [facility] CATH LAB;  Service: Cardiovascular;  Laterality: N/A;  . Permanent pacemaker insertion N/A 03/28/2014    Procedure: PERMANENT PACEMAKER INSERTION;  Surgeon: Duke Salvia, MD;  Location: [facility] CATH LAB;  Service: Cardiovascular;  Laterality: N/A;   Family History  Problem Relation Age of Onset  . Heart attack Brother   . Alcohol abuse Father     died in his late 95's  . Other Mother     died in her late 75's - old age.   Social History  Substance Use Topics  . Smoking status: Former Smoker -- 1.50 packs/day for 20 years    Quit date: 12/12/1970  . Smokeless tobacco: Never Used  . Alcohol Use: No    Review of Systems  Constitutional: Negative for fever.  Gastrointestinal: Positive for abdominal pain and constipation. Negative for vomiting.  Genitourinary: Positive for flank pain. Negative for dysuria and hematuria.  Musculoskeletal: Positive for back pain.  Skin: Negative for rash.  All other systems reviewed and are negative.     Allergies  Zocor  Home Medications   Prior to Admission medications   Medication  Sig Start Date End Date Taking? Authorizing Provider  allopurinol (ZYLOPRIM) 300 MG tablet Take 450 mg by mouth daily.     Historical Provider, MD  amLODipine (NORVASC) 5 MG tablet Take 5 mg by mouth daily.    Historical Provider, MD  apixaban (ELIQUIS) 2.5 MG TABS tablet Take 1 tablet (2.5 mg total) by mouth 2 (two) times daily. 08/12/14   Peter M [place], MD  Cholecalciferol (VITAMIN D-3) 1000 UNITS CAPS Take 1,000 Units by mouth 2 (two) times daily.     Historical Provider, MD  finasteride (PROSCAR) 5 MG tablet Take 5 mg by mouth every evening.     Historical Provider, MD  furosemide (LASIX) 20 MG tablet Take 2 tablets (40 mg total) by mouth daily. 09/02/15   Calvert Cantor, MD    isosorbide mononitrate (IMDUR) 60 MG 24 hr tablet Take 1 tablet (60 mg total) by mouth daily. 05/31/15   Peter M [place], MD  levothyroxine (SYNTHROID, LEVOTHROID) 50 MCG tablet Take 50 mcg by mouth daily before breakfast.    Historical Provider, MD  nitroGLYCERIN (NITROSTAT) 0.4 MG SL tablet Place 1 tablet (0.4 mg total) under the tongue every 5 (five) minutes as needed. x3 doses as needed for chest pain 10/18/12   Ok Anis, NP  omeprazole (PRILOSEC) 20 MG capsule Take 20 mg by mouth daily.     Historical Provider, MD  oxyCODONE (ROXICODONE) 5 MG immediate release tablet Take 1 tablet (5 mg total) by mouth every 4 (four) hours as needed for severe pain. 09/23/15   Lyndal Pulley, MD  ranitidine (ZANTAC) 300 MG capsule Take 300 mg by mouth every evening.    Historical Provider, MD  valACYclovir (VALTREX) 1000 MG tablet Take 1 tablet (1,000 mg total) by mouth at bedtime. 09/01/15   Calvert Cantor, MD   BP 102/63 mmHg  Pulse 70  Temp(Src) 97.9 F (36.6 C) (Oral)  Resp 14  Ht  (1.778 m)  Wt 170 lb (77.111 kg)  BMI 24.39 kg/m2  SpO2 100% Physical Exam  Constitutional: He is oriented to person, place, and time. He appears well-developed and well-nourished.  Resting comfortably in stretcher, watching TV  HENT:  Head: Normocephalic and atraumatic.  Right Ear: External ear normal.  Left Ear: External ear normal.  Nose: Nose normal.  Eyes: Right eye exhibits no discharge. Left eye exhibits no discharge.  Neck: Neck supple.  Cardiovascular: Normal rate, regular rhythm, normal heart sounds and intact distal pulses.   Pulmonary/Chest: Effort normal and breath sounds normal.  Abdominal: Soft. He exhibits no distension. There is no tenderness.  Musculoskeletal: He exhibits no edema.       Lumbar back: He exhibits tenderness.       Back:  Neurological: He is alert and oriented to person, place, and time.  Skin: Skin is warm and dry. No rash noted.  Nursing note and vitals  reviewed.   ED Course  Procedures (including critical care time) Labs Review Labs Reviewed  COMPREHENSIVE METABOLIC PANEL - Abnormal; Notable for the following:    Glucose, Bld 124 (*)    Creatinine, Ser 1.51 (*)    Total Protein 6.4 (*)    GFR calc non Af Amer 38 (*)    GFR calc Af Amer 44 (*)    All other components within normal limits  CBC WITH DIFFERENTIAL/PLATELET - Abnormal; Notable for the following:    Hemoglobin 12.7 (*)    HCT 37.3 (*)    RDW 16.5 (*)  All other components within normal limits  LIPASE, BLOOD  URINALYSIS, ROUTINE W REFLEX MICROSCOPIC (NOT AT [facility])    Imaging Review Dg Abd 1 View  10/02/2015  CLINICAL DATA:  Low back pain. Recent diagnosis of shingles. Still with pain. Also has abdominal pain. EXAM: ABDOMEN - 1 VIEW COMPARISON:  CT, 09/09/2014 FINDINGS: Normal bowel gas pattern. No evidence of obstruction. No significant increased colonic stool. No evidence of renal or ureteral stones. Hernia mesh overtly low anterior abdominal wall is stable from the prior study. There are degenerative changes along the visualized spine. IMPRESSION: 1. No acute findings.  No evidence of bowel obstruction. 2. No significant increased stool in the colon. Electronically Signed   By: Amie Portland M.D.   On: 10/02/2015 22:08   I have personally reviewed and evaluated these images and lab results as part of my medical decision-making.   EKG Interpretation None      MDM   Final diagnoses:  HZV (herpes zoster virus) post herpetic neuralgia    Patient with continued pain consistent with postherpetic neuralgia. He reports abdominal pain but there is no abdominal tenderness, no fevers, or vomiting. Blood work and x-ray are unremarkable. Urine is negative. Very low suspicion for an intra-abdominal source, this seems consistent with his neuralgia. I offered low-dose Neurontin and a lidocaine cream or patch but family is resistant to this and wants to follow-up with the PCP.  They do want more narcotics in case it takes time to get into PCP    Pricilla Loveless, MD 10/02/15 2337

## 2015-10-10 DIAGNOSIS — B0229 Other postherpetic nervous system involvement: Secondary | ICD-10-CM | POA: Diagnosis not present

## 2015-10-10 DIAGNOSIS — E039 Hypothyroidism, unspecified: Secondary | ICD-10-CM | POA: Diagnosis not present

## 2015-12-05 ENCOUNTER — Ambulatory Visit (INDEPENDENT_AMBULATORY_CARE_PROVIDER_SITE_OTHER): Payer: Medicare Other | Admitting: *Deleted

## 2015-12-05 DIAGNOSIS — I441 Atrioventricular block, second degree: Secondary | ICD-10-CM | POA: Diagnosis not present

## 2015-12-05 NOTE — Progress Notes (Signed)
Remote pacemaker transmission.   

## 2016-01-04 ENCOUNTER — Ambulatory Visit (INDEPENDENT_AMBULATORY_CARE_PROVIDER_SITE_OTHER): Payer: Medicare Other | Admitting: Cardiology

## 2016-01-04 ENCOUNTER — Encounter: Payer: Self-pay | Admitting: Cardiology

## 2016-01-04 VITALS — BP 144/68 | HR 72 | Ht 70.5 in | Wt 177.0 lb

## 2016-01-04 DIAGNOSIS — I483 Typical atrial flutter: Secondary | ICD-10-CM

## 2016-01-04 DIAGNOSIS — I2583 Coronary atherosclerosis due to lipid rich plaque: Principal | ICD-10-CM

## 2016-01-04 DIAGNOSIS — I5022 Chronic systolic (congestive) heart failure: Secondary | ICD-10-CM | POA: Diagnosis not present

## 2016-01-04 DIAGNOSIS — I2510 Atherosclerotic heart disease of native coronary artery without angina pectoris: Secondary | ICD-10-CM | POA: Diagnosis not present

## 2016-01-04 DIAGNOSIS — I441 Atrioventricular block, second degree: Secondary | ICD-10-CM | POA: Diagnosis not present

## 2016-01-04 NOTE — Progress Notes (Signed)
Craig [name] Date of Birth: [birth year] Medical Record #[id number]  History of Present Illness: [name] is seen for follow up CAD. He is now 82 years old. He is seen with his daughter. He has a history of CAD with chronic occlusion of the LCx- last cardiac cath in 2003. He also has a history of CHF with EF of 45-50%- by Echo December 2015. He has moderate  MR managed medically. He has a history of atrial flutter and is on Eliquis. He has a history of Mobitz type 1 AV block when in NSR. Prior Holter monitor in 9/13 showed AV block without indication for pacemaker.  In May 2015 he wore an event monitor  showing atrial flutter with slow ventricular response as low as 40 bpm. He underwent pacemaker implant in August 2015.  On follow up today he denies any cardiac complaints. No chest pain, dyspnea, edema, or palpitations. His only complaint is that he has no energy and as a result his activity level has decreased. Overall doing pretty well. He was admitted earlier this year with a bad case of shingles.   Current Outpatient Prescriptions on File Prior to Visit  Medication Sig Dispense Refill  . allopurinol (ZYLOPRIM) 300 MG tablet Take 450 mg by mouth daily.     Marland Kitchen amLODipine (NORVASC) 5 MG tablet Take 5 mg by mouth daily.    Marland Kitchen apixaban (ELIQUIS) 2.5 MG TABS tablet Take 1 tablet (2.5 mg total) by mouth 2 (two) times daily. 60 tablet 5  . Cholecalciferol (VITAMIN D-3) 1000 UNITS CAPS Take 1,000 Units by mouth 2 (two) times daily.     . finasteride (PROSCAR) 5 MG tablet Take 5 mg by mouth every evening.     . furosemide (LASIX) 20 MG tablet Take 2 tablets (40 mg total) by mouth daily. 60 tablet 2  . isosorbide mononitrate (IMDUR) 60 MG 24 hr tablet Take 1 tablet (60 mg total) by mouth daily. 30 tablet 11  . levothyroxine (SYNTHROID, LEVOTHROID) 50 MCG tablet Take 50 mcg by mouth daily before breakfast.    . nitroGLYCERIN (NITROSTAT) 0.4 MG SL tablet Place 1 tablet (0.4 mg total) under the tongue  every 5 (five) minutes as needed. x3 doses as needed for chest pain 25 tablet 3  . omeprazole (PRILOSEC) 20 MG capsule Take 20 mg by mouth daily.     . ranitidine (ZANTAC) 300 MG capsule Take 300 mg by mouth every evening.     No current facility-administered medications on file prior to visit.    Allergies  Allergen Reactions  . Zocor [Simvastatin] Other (See Comments)    Unknown reaction per pt    Past Medical History  Diagnosis Date  . Mitral insufficiency     chronically moderate to severe  . Hypertension   . Hyperlipidemia   . 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 Coronary artery disease     a. Remote lateral infarct;  b. 09/2011 MV: large dense inferolat/lat scar - ? very slight peri-infarcrt isch.  No signif change c/w 09/2010 scan.  . Raynaud's disease /phenomenon   . Hard of hearing     bilaterally  . Gout   . PTSD (post-traumatic stress disorder)     "being treated for it now but not a big degree"  . Asthma   . Noncompliance with medication regimen   . Chronic systolic CHF (congestive heart failure) (HCC)     a. 10/2011 Echo: EF 40-45%, inf  AK, PASP 57mmHg.  Marland Kitchen. Persistent atrial fibrillation (HCC)   . CKD (chronic kidney disease), stage III   . Hypothyroidism     Past Surgical History  Procedure Laterality Date  . Prostatectomy    . Tonsillectomy    . Inguinal hernia repair      left  . Appendectomy    . Cardiac catheterization  2003    SHOWED OCCLUSION OF THE LEFT CIRCUMFLEX CORONARY ARTERY  . Pacemaker insertion  03/28/14    STJ single chamber pacemaker implanted by Dr Graciela HusbandsKlein for symptomatic bradycardia  . Left heart catheterization with coronary angiogram N/A 08/29/2011    Procedure: LEFT HEART CATHETERIZATION WITH CORONARY ANGIOGRAM;  Surgeon: Kathleene Hazelhristopher D McAlhany, MD;  [facility];  Service: Cardiovascular;  Laterality: N/A;  . Permanent pacemaker insertion N/A 03/28/2014    Procedure: PERMANENT PACEMAKER INSERTION;  Surgeon: Duke SalviaSteven C Klein,  MD;  [facility];  Service: Cardiovascular;  Laterality: N/A;    History  Smoking status  . Former Smoker -- 1.50 packs/day for 20 years  . Quit date: 12/12/1970  Smokeless tobacco  . Never Used    History  Alcohol Use No    Family History  Problem Relation Age of Onset  . Heart attack Brother   . Alcohol abuse Father     died in his late 3950's  . Other Mother     died in her late 780's - old age.    Review of Systems: As noted in history of present illness  All other systems were reviewed and are negative.  Physical Exam: BP 144/68 mmHg  Pulse 72  Ht 5' 10.5" (1.791 m)  Wt 80.287 kg (177 lb)  BMI 25.03 kg/m2 He is an elderly white male, pleasant, in no distress. He is balding. He wears a hearing aid. Pupils are equal round and reactive. Sclera are clear. Oropharynx is clear. Neck is without JVD, adenopathy, thyromegaly, or bruits. Lungs are clear to auscultation and percussion. Cardiac exam reveals a regular rate and rhythm with a grade 2/6 systolic murmur at the apex. No chest wall tenderness to palpation. Pacer site looks fine. Abdomen is soft and nontender without organomegaly or masses. Extremities are without edema or cyanosis. Pulses are 2+. Skin is warm and dry. Neurologic exam reveals no focal abnormalities. Mood is appropriate and he even laughed a bit.   LABORATORY DATA:   Lab Results  Component Value Date   WBC 9.2 10/02/2015   HGB 12.7* 10/02/2015   HCT 37.3* 10/02/2015   PLT 177 10/02/2015   GLUCOSE 124* 10/02/2015   CHOL 131 08/29/2011   TRIG 75 08/29/2011   HDL 46 08/29/2011   LDLCALC 70 08/29/2011   ALT 19 10/02/2015   AST 23 10/02/2015   NA 136 10/02/2015   K 4.3 10/02/2015   CL 102 10/02/2015   CREATININE 1.51* 10/02/2015   BUN 20 10/02/2015   CO2 22 10/02/2015   TSH 3.540 11/12/2013   INR 1.33 08/31/2015   HGBA1C 6.5* 08/29/2011    Assessment / Plan:  1. Coronary disease with chronic occlusion of the left circumflex  coronary. No significant anginal symptoms. Continue amlodipine and isosorbide.  2. History of Mobitz type I second-degree AV block. Now s/p pacemaker.  3. Congestive heart failure chronic systolic, clinically well compensated. Ejection fraction of 45-50%.  He is euvolemic.  4. Mitral insufficiency. Moderate by most recent Echo. This is of longstanding duration at least since 2001 and unchanged.  5. Hypertension.  6. Atrial flutter-rate with slow ventricular response. Pacemaker in place. On Eliquis for anti-coagulation.  7. Symptoms of weakness and fatigue. No clear explanation despite extensive evaluation in the past. Suspect this is more related to his advanced age.   Will continue current therapy and followup in 6 months.

## 2016-01-09 DIAGNOSIS — I129 Hypertensive chronic kidney disease with stage 1 through stage 4 chronic kidney disease, or unspecified chronic kidney disease: Secondary | ICD-10-CM | POA: Diagnosis not present

## 2016-01-09 DIAGNOSIS — I481 Persistent atrial fibrillation: Secondary | ICD-10-CM | POA: Diagnosis not present

## 2016-01-09 DIAGNOSIS — N183 Chronic kidney disease, stage 3 (moderate): Secondary | ICD-10-CM | POA: Diagnosis not present

## 2016-01-09 DIAGNOSIS — N400 Enlarged prostate without lower urinary tract symptoms: Secondary | ICD-10-CM | POA: Diagnosis not present

## 2016-01-09 DIAGNOSIS — M109 Gout, unspecified: Secondary | ICD-10-CM | POA: Diagnosis not present

## 2016-01-09 DIAGNOSIS — H6123 Impacted cerumen, bilateral: Secondary | ICD-10-CM | POA: Diagnosis not present

## 2016-01-09 DIAGNOSIS — R609 Edema, unspecified: Secondary | ICD-10-CM | POA: Diagnosis not present

## 2016-01-09 DIAGNOSIS — Z1389 Encounter for screening for other disorder: Secondary | ICD-10-CM | POA: Diagnosis not present

## 2016-01-09 DIAGNOSIS — K219 Gastro-esophageal reflux disease without esophagitis: Secondary | ICD-10-CM | POA: Diagnosis not present

## 2016-01-12 ENCOUNTER — Encounter: Payer: Self-pay | Admitting: Cardiology

## 2016-01-16 LAB — CUP PACEART REMOTE DEVICE CHECK
Battery Remaining Longevity: 134 mo
Date Time Interrogation Session: 20170502061718
Implantable Lead Implant Date: 20150824
Implantable Lead Location: 753860
Implantable Lead Model: 1948
Lead Channel Pacing Threshold Amplitude: 0.875 V
Lead Channel Sensing Intrinsic Amplitude: 8.3 mV
Lead Channel Setting Sensing Sensitivity: 2 mV
MDC IDC MSMT BATTERY REMAINING PERCENTAGE: 95.5 %
MDC IDC MSMT BATTERY VOLTAGE: 3.02 V
MDC IDC MSMT LEADCHNL RV IMPEDANCE VALUE: 530 Ohm
MDC IDC MSMT LEADCHNL RV PACING THRESHOLD PULSEWIDTH: 0.4 ms
MDC IDC SET LEADCHNL RV PACING AMPLITUDE: 1.125
MDC IDC SET LEADCHNL RV PACING PULSEWIDTH: 0.4 ms
MDC IDC STAT BRADY RV PERCENT PACED: 59 %
Pulse Gen Serial Number: 7616716

## 2016-03-26 DIAGNOSIS — R634 Abnormal weight loss: Secondary | ICD-10-CM | POA: Diagnosis not present

## 2016-03-26 DIAGNOSIS — E860 Dehydration: Secondary | ICD-10-CM | POA: Diagnosis not present

## 2016-03-26 DIAGNOSIS — Z6825 Body mass index (BMI) 25.0-25.9, adult: Secondary | ICD-10-CM | POA: Diagnosis not present

## 2016-03-28 ENCOUNTER — Encounter: Payer: Self-pay | Admitting: Internal Medicine

## 2016-04-11 ENCOUNTER — Ambulatory Visit (INDEPENDENT_AMBULATORY_CARE_PROVIDER_SITE_OTHER): Payer: Medicare Other | Admitting: Internal Medicine

## 2016-04-11 ENCOUNTER — Encounter: Payer: Self-pay | Admitting: Internal Medicine

## 2016-04-11 ENCOUNTER — Encounter (INDEPENDENT_AMBULATORY_CARE_PROVIDER_SITE_OTHER): Payer: Self-pay

## 2016-04-11 VITALS — BP 110/62 | HR 71 | Ht 70.0 in | Wt 173.2 lb

## 2016-04-11 DIAGNOSIS — I440 Atrioventricular block, first degree: Secondary | ICD-10-CM

## 2016-04-11 DIAGNOSIS — I495 Sick sinus syndrome: Secondary | ICD-10-CM | POA: Diagnosis not present

## 2016-04-11 DIAGNOSIS — I519 Heart disease, unspecified: Secondary | ICD-10-CM | POA: Diagnosis not present

## 2016-04-11 DIAGNOSIS — I340 Nonrheumatic mitral (valve) insufficiency: Secondary | ICD-10-CM

## 2016-04-11 DIAGNOSIS — I482 Chronic atrial fibrillation, unspecified: Secondary | ICD-10-CM

## 2016-04-11 DIAGNOSIS — I441 Atrioventricular block, second degree: Secondary | ICD-10-CM | POA: Diagnosis not present

## 2016-04-11 DIAGNOSIS — Z950 Presence of cardiac pacemaker: Secondary | ICD-10-CM

## 2016-04-11 DIAGNOSIS — I4892 Unspecified atrial flutter: Secondary | ICD-10-CM

## 2016-04-11 DIAGNOSIS — I2510 Atherosclerotic heart disease of native coronary artery without angina pectoris: Secondary | ICD-10-CM

## 2016-04-11 NOTE — Progress Notes (Signed)
Patient Care Team: Elias Elseobert Reade, MD as PCP - General (Family Medicine)   HPI  Craig [name] is a 82 y.o. male Seen in follow-up for pacemaker implanted 8/15 for symptomatic bradycardia.  He has persistent atrial fibrillation. Reviewing electrocardiograms 12/13 was the last time he was in sinus rhythm. He has complaints of exercise intolerance-- this has not changed.  he takes anticoagulation  No chest pain or edema   Echocardiogram 3/14 demonstrated moderate LV dysfunction 40-45% with moderate-severe MR and severe LAE.      Past Medical History:  Diagnosis Date  . Asthma   . 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 Chronic systolic CHF (congestive heart failure) (HCC)    a. 10/2011 Echo: EF 40-45%, inf AK, PASP 57mmHg.  . CKD (chronic kidney disease), stage III   . Coronary artery disease    a. Remote lateral infarct;  b. 09/2011 MV: large dense inferolat/lat scar - ? very slight peri-infarcrt isch.  No signif change c/w 09/2010 scan.  . Gout   . Hard of hearing    bilaterally  . Hyperlipidemia   . Hypertension   . Hypothyroidism   . Mitral insufficiency    chronically moderate to severe  . Noncompliance with medication regimen   . Persistent atrial fibrillation (HCC)   . PTSD (post-traumatic stress disorder)    "being treated for it now but not a big degree"  . Raynaud's disease /phenomenon     Past Surgical History:  Procedure Laterality Date  . APPENDECTOMY    . CARDIAC CATHETERIZATION  2003   SHOWED OCCLUSION OF THE LEFT CIRCUMFLEX CORONARY ARTERY  . INGUINAL HERNIA REPAIR     left  . LEFT HEART CATHETERIZATION WITH CORONARY ANGIOGRAM N/A 08/29/2011   Procedure: LEFT HEART CATHETERIZATION WITH CORONARY ANGIOGRAM;  Surgeon: Kathleene Hazelhristopher D McAlhany, MD;  [facility];  Service: Cardiovascular;  Laterality: N/A;  . PACEMAKER INSERTION  03/28/14   STJ single chamber pacemaker implanted by Dr Graciela HusbandsKlein for symptomatic bradycardia  . PERMANENT PACEMAKER  INSERTION N/A 03/28/2014   Procedure: PERMANENT PACEMAKER INSERTION;  Surgeon: Duke SalviaSteven C Genni Buske, MD;  [facility];  Service: Cardiovascular;  Laterality: N/A;  . PROSTATECTOMY    . TONSILLECTOMY      Current Outpatient Prescriptions  Medication Sig Dispense Refill  . allopurinol (ZYLOPRIM) 300 MG tablet Take 450 mg by mouth daily.     Marland Kitchen. apixaban (ELIQUIS) 2.5 MG TABS tablet Take 1 tablet (2.5 mg total) by mouth 2 (two) times daily. 60 tablet 5  . Cholecalciferol (VITAMIN D-3) 1000 UNITS CAPS Take 1,000 Units by mouth 2 (two) times daily.     . finasteride (PROSCAR) 5 MG tablet Take 5 mg by mouth every evening.     . furosemide (LASIX) 20 MG tablet Take 2 tablets (40 mg total) by mouth daily. 60 tablet 2  . isosorbide mononitrate (IMDUR) 60 MG 24 hr tablet Take 1 tablet (60 mg total) by mouth daily. 30 tablet 11  . levothyroxine (SYNTHROID, LEVOTHROID) 50 MCG tablet Take 50 mcg by mouth daily before breakfast.    . nitroGLYCERIN (NITROSTAT) 0.4 MG SL tablet Place 1 tablet (0.4 mg total) under the tongue every 5 (five) minutes as needed. x3 doses as needed for chest pain 25 tablet 3  . omeprazole (PRILOSEC) 20 MG capsule Take 20 mg by mouth daily.     . ranitidine (ZANTAC) 300 MG capsule Take 300 mg by mouth  every evening.     No current facility-administered medications for this visit.     Allergies  Allergen Reactions  . Zocor [Simvastatin] Other (See Comments)    Unknown reaction per pt    Review of Systems negative except from HPI and PMH  Physical Exam BP 110/62   Pulse 71   Ht 5\' 10"  (1.778 m)   Wt 173 lb 3.2 oz (78.6 kg)   SpO2 98%   BMI 24.85 kg/m  Well developed and well nourished in no acute distress HENT normal E scleral and icterus clear Neck Supple JVP flat; carotids brisk and full Clear to ausculation Device pocket well healed; without hematoma or erythema.  There is no tethering Regular rate and rhythm, 3/6 murmur  Soft with active bowel sounds No  clubbing cyanosis  Edema Alert and oriented, grossly normal motor and sensory function Skin Warm and Dry   ECG demonstrates atypical atrial flutter at 60 variable conduction and some V pacing  Intervals-/07/40 Axis left-73   Assessment and  Pl Atrial flutter permanent  Pacemaker-[employer]  The patient's device was interrogated.  The information was reviewed. No changes were made in the programming.     Renal insufficiency  LV dysfunction/valvular cardiomyopathy  Mitral regurgitation-moderate-severe  The patient has progressive shortness of breath. I suspect he has been worsening mitral regurgitation and/or LV dysfunction. I've encouraged him to do when he wants to do.  Tolerating apixoban without bleeding issues   Dose appropriate with age and renal function

## 2016-04-11 NOTE — Patient Instructions (Signed)
Medication Instructions:   Your physician has recommended you make the following change in your medication:   1) STOP Amlodipine  --- If you need a refill on your cardiac medications before your next appointment, please call your pharmacy. ---  Labwork:  None ordered  Testing/Procedures:  None ordered  Follow-Up:  Your physician wants you to follow-up in: 1 year with Dr. Graciela HusbandsKlein.  You will receive a reminder letter in the mail two months in advance. If you don't receive a letter, please call our office to schedule the follow-up appointment.  Thank you for choosing [facility]!!

## 2016-04-12 LAB — CUP PACEART INCLINIC DEVICE CHECK
Battery Remaining Longevity: 117.6
Battery Voltage: 3.02 V
Brady Statistic RV Percent Paced: 63 %
Date Time Interrogation Session: 20170907195158
Implantable Lead Implant Date: 20150824
Implantable Lead Location: 753860
Implantable Lead Model: 1948
Lead Channel Pacing Threshold Amplitude: 0.75 V
Lead Channel Pacing Threshold Pulse Width: 0.4 ms
Lead Channel Sensing Intrinsic Amplitude: 10.6 mV
MDC IDC MSMT LEADCHNL RV IMPEDANCE VALUE: 525 Ohm
MDC IDC MSMT LEADCHNL RV PACING THRESHOLD AMPLITUDE: 0.75 V
MDC IDC MSMT LEADCHNL RV PACING THRESHOLD PULSEWIDTH: 0.4 ms
MDC IDC PG SERIAL: 7616716
MDC IDC SET LEADCHNL RV PACING AMPLITUDE: 2.5 V
MDC IDC SET LEADCHNL RV PACING PULSEWIDTH: 0.4 ms
MDC IDC SET LEADCHNL RV SENSING SENSITIVITY: 2 mV
Pulse Gen Model: 1240

## 2016-04-16 DIAGNOSIS — E860 Dehydration: Secondary | ICD-10-CM | POA: Diagnosis not present

## 2016-04-23 DIAGNOSIS — N400 Enlarged prostate without lower urinary tract symptoms: Secondary | ICD-10-CM | POA: Diagnosis not present

## 2016-04-23 DIAGNOSIS — R338 Other retention of urine: Secondary | ICD-10-CM | POA: Diagnosis not present

## 2016-04-23 DIAGNOSIS — N139 Obstructive and reflux uropathy, unspecified: Secondary | ICD-10-CM | POA: Diagnosis not present

## 2016-05-31 DIAGNOSIS — Z23 Encounter for immunization: Secondary | ICD-10-CM | POA: Diagnosis not present

## 2016-06-03 ENCOUNTER — Other Ambulatory Visit: Payer: Self-pay | Admitting: Cardiology

## 2016-07-11 ENCOUNTER — Ambulatory Visit (INDEPENDENT_AMBULATORY_CARE_PROVIDER_SITE_OTHER): Payer: Medicare Other | Admitting: *Deleted

## 2016-07-11 DIAGNOSIS — I495 Sick sinus syndrome: Secondary | ICD-10-CM

## 2016-07-11 NOTE — Progress Notes (Signed)
Remote pacemaker transmission.   

## 2016-07-17 ENCOUNTER — Encounter: Payer: Self-pay | Admitting: Cardiology

## 2016-08-07 LAB — CUP PACEART REMOTE DEVICE CHECK
Battery Remaining Longevity: 130 mo
Battery Remaining Percentage: 95.5 %
Implantable Lead Implant Date: 20150824
Implantable Pulse Generator Implant Date: 20150824
Lead Channel Pacing Threshold Amplitude: 0.75 V
Lead Channel Pacing Threshold Pulse Width: 0.4 ms
Lead Channel Setting Sensing Sensitivity: 2 mV
MDC IDC LEAD LOCATION: 753860
MDC IDC LEAD MODEL: 1948
MDC IDC MSMT BATTERY VOLTAGE: 3.02 V
MDC IDC MSMT LEADCHNL RV IMPEDANCE VALUE: 530 Ohm
MDC IDC MSMT LEADCHNL RV SENSING INTR AMPL: 8.7 mV
MDC IDC PG SERIAL: 7616716
MDC IDC SESS DTM: 20171207070013
MDC IDC SET LEADCHNL RV PACING AMPLITUDE: 2.5 V
MDC IDC SET LEADCHNL RV PACING PULSEWIDTH: 0.4 ms
MDC IDC STAT BRADY RV PERCENT PACED: 79 %

## 2016-08-14 ENCOUNTER — Inpatient Hospital Stay (HOSPITAL_COMMUNITY)
Admission: EM | Admit: 2016-08-14 | Discharge: 2016-08-24 | DRG: 177 | Disposition: A | Discharge status: Hospice | Payer: Medicare Other | Attending: Internal Medicine | Admitting: Internal Medicine

## 2016-08-14 ENCOUNTER — Emergency Department (HOSPITAL_COMMUNITY): Payer: Medicare Other

## 2016-08-14 ENCOUNTER — Encounter (HOSPITAL_COMMUNITY): Payer: Self-pay | Admitting: Emergency Medicine

## 2016-08-14 DIAGNOSIS — E785 Hyperlipidemia, unspecified: Secondary | ICD-10-CM | POA: Diagnosis present

## 2016-08-14 DIAGNOSIS — R29810 Facial weakness: Secondary | ICD-10-CM | POA: Diagnosis present

## 2016-08-14 DIAGNOSIS — N183 Chronic kidney disease, stage 3 unspecified: Secondary | ICD-10-CM | POA: Diagnosis present

## 2016-08-14 DIAGNOSIS — R4702 Dysphasia: Secondary | ICD-10-CM | POA: Diagnosis present

## 2016-08-14 DIAGNOSIS — J690 Pneumonitis due to inhalation of food and vomit: Principal | ICD-10-CM

## 2016-08-14 DIAGNOSIS — I5022 Chronic systolic (congestive) heart failure: Secondary | ICD-10-CM | POA: Diagnosis present

## 2016-08-14 DIAGNOSIS — I639 Cerebral infarction, unspecified: Secondary | ICD-10-CM

## 2016-08-14 DIAGNOSIS — R4182 Altered mental status, unspecified: Secondary | ICD-10-CM | POA: Diagnosis not present

## 2016-08-14 DIAGNOSIS — I130 Hypertensive heart and chronic kidney disease with heart failure and stage 1 through stage 4 chronic kidney disease, or unspecified chronic kidney disease: Secondary | ICD-10-CM | POA: Diagnosis not present

## 2016-08-14 DIAGNOSIS — Z7901 Long term (current) use of anticoagulants: Secondary | ICD-10-CM

## 2016-08-14 DIAGNOSIS — F0390 Unspecified dementia without behavioral disturbance: Secondary | ICD-10-CM | POA: Diagnosis present

## 2016-08-14 DIAGNOSIS — N179 Acute kidney failure, unspecified: Secondary | ICD-10-CM | POA: Diagnosis not present

## 2016-08-14 DIAGNOSIS — Z811 Family history of alcohol abuse and dependence: Secondary | ICD-10-CM

## 2016-08-14 DIAGNOSIS — Z7189 Other specified counseling: Secondary | ICD-10-CM

## 2016-08-14 DIAGNOSIS — E039 Hypothyroidism, unspecified: Secondary | ICD-10-CM | POA: Diagnosis present

## 2016-08-14 DIAGNOSIS — I4892 Unspecified atrial flutter: Secondary | ICD-10-CM | POA: Diagnosis present

## 2016-08-14 DIAGNOSIS — G9340 Encephalopathy, unspecified: Secondary | ICD-10-CM | POA: Diagnosis not present

## 2016-08-14 DIAGNOSIS — I482 Chronic atrial fibrillation: Secondary | ICD-10-CM

## 2016-08-14 DIAGNOSIS — N400 Enlarged prostate without lower urinary tract symptoms: Secondary | ICD-10-CM | POA: Diagnosis present

## 2016-08-14 DIAGNOSIS — I2510 Atherosclerotic heart disease of native coronary artery without angina pectoris: Secondary | ICD-10-CM | POA: Diagnosis present

## 2016-08-14 DIAGNOSIS — Z9189 Other specified personal risk factors, not elsewhere classified: Secondary | ICD-10-CM

## 2016-08-14 DIAGNOSIS — R4701 Aphasia: Secondary | ICD-10-CM | POA: Diagnosis present

## 2016-08-14 DIAGNOSIS — Z515 Encounter for palliative care: Secondary | ICD-10-CM | POA: Diagnosis present

## 2016-08-14 DIAGNOSIS — E860 Dehydration: Secondary | ICD-10-CM | POA: Diagnosis present

## 2016-08-14 DIAGNOSIS — I481 Persistent atrial fibrillation: Secondary | ICD-10-CM | POA: Diagnosis present

## 2016-08-14 DIAGNOSIS — I4891 Unspecified atrial fibrillation: Secondary | ICD-10-CM | POA: Diagnosis present

## 2016-08-14 DIAGNOSIS — R29818 Other symptoms and signs involving the nervous system: Secondary | ICD-10-CM | POA: Diagnosis not present

## 2016-08-14 DIAGNOSIS — Z888 Allergy status to other drugs, medicaments and biological substances status: Secondary | ICD-10-CM

## 2016-08-14 DIAGNOSIS — R918 Other nonspecific abnormal finding of lung field: Secondary | ICD-10-CM | POA: Diagnosis not present

## 2016-08-14 DIAGNOSIS — R531 Weakness: Secondary | ICD-10-CM | POA: Diagnosis not present

## 2016-08-14 DIAGNOSIS — I248 Other forms of acute ischemic heart disease: Secondary | ICD-10-CM | POA: Diagnosis not present

## 2016-08-14 DIAGNOSIS — F4310 Post-traumatic stress disorder, unspecified: Secondary | ICD-10-CM | POA: Diagnosis present

## 2016-08-14 DIAGNOSIS — I7300 Raynaud's syndrome without gangrene: Secondary | ICD-10-CM | POA: Diagnosis present

## 2016-08-14 DIAGNOSIS — Z87891 Personal history of nicotine dependence: Secondary | ICD-10-CM

## 2016-08-14 DIAGNOSIS — T501X5A Adverse effect of loop [high-ceiling] diuretics, initial encounter: Secondary | ICD-10-CM | POA: Diagnosis present

## 2016-08-14 DIAGNOSIS — R404 Transient alteration of awareness: Secondary | ICD-10-CM | POA: Diagnosis not present

## 2016-08-14 DIAGNOSIS — R7989 Other specified abnormal findings of blood chemistry: Secondary | ICD-10-CM

## 2016-08-14 DIAGNOSIS — H9190 Unspecified hearing loss, unspecified ear: Secondary | ICD-10-CM | POA: Diagnosis present

## 2016-08-14 DIAGNOSIS — Z66 Do not resuscitate: Secondary | ICD-10-CM | POA: Diagnosis present

## 2016-08-14 DIAGNOSIS — I10 Essential (primary) hypertension: Secondary | ICD-10-CM | POA: Diagnosis present

## 2016-08-14 DIAGNOSIS — J45909 Unspecified asthma, uncomplicated: Secondary | ICD-10-CM | POA: Diagnosis present

## 2016-08-14 DIAGNOSIS — K219 Gastro-esophageal reflux disease without esophagitis: Secondary | ICD-10-CM | POA: Diagnosis present

## 2016-08-14 DIAGNOSIS — Z8249 Family history of ischemic heart disease and other diseases of the circulatory system: Secondary | ICD-10-CM

## 2016-08-14 DIAGNOSIS — R778 Other specified abnormalities of plasma proteins: Secondary | ICD-10-CM | POA: Diagnosis present

## 2016-08-14 DIAGNOSIS — Z950 Presence of cardiac pacemaker: Secondary | ICD-10-CM

## 2016-08-14 LAB — COMPREHENSIVE METABOLIC PANEL
ALT: 22 U/L (ref 17–63)
AST: 27 U/L (ref 15–41)
Albumin: 3.8 g/dL (ref 3.5–5.0)
Alkaline Phosphatase: 113 U/L (ref 38–126)
Anion gap: 10 (ref 5–15)
BUN: 19 mg/dL (ref 6–20)
CHLORIDE: 103 mmol/L (ref 101–111)
CO2: 23 mmol/L (ref 22–32)
CREATININE: 1.41 mg/dL — AB (ref 0.61–1.24)
Calcium: 9.8 mg/dL (ref 8.9–10.3)
GFR calc Af Amer: 47 mL/min — ABNORMAL LOW (ref >60)
GFR, EST NON AFRICAN AMERICAN: 41 mL/min — AB (ref >60)
Glucose, Bld: 112 mg/dL — ABNORMAL HIGH (ref 65–99)
Potassium: 3.9 mmol/L (ref 3.5–5.1)
SODIUM: 136 mmol/L (ref 135–145)
Total Bilirubin: 0.6 mg/dL (ref 0.3–1.2)
Total Protein: 7.3 g/dL (ref 6.5–8.1)

## 2016-08-14 LAB — CBC
HEMATOCRIT: 40.1 % (ref 39.0–52.0)
Hemoglobin: 14.3 g/dL (ref 13.0–17.0)
MCH: 29.5 pg (ref 26.0–34.0)
MCHC: 35.7 g/dL (ref 30.0–36.0)
MCV: 82.9 fL (ref 78.0–100.0)
Platelets: 195 10*3/uL (ref 150–400)
RBC: 4.84 MIL/uL (ref 4.22–5.81)
RDW: 15.5 % (ref 11.5–15.5)
WBC: 7.1 10*3/uL (ref 4.0–10.5)

## 2016-08-14 LAB — DIFFERENTIAL
BASOS ABS: 0 10*3/uL (ref 0.0–0.1)
BASOS PCT: 0 %
Eosinophils Absolute: 0.2 10*3/uL (ref 0.0–0.7)
Eosinophils Relative: 3 %
Lymphocytes Relative: 17 %
Lymphs Abs: 1.2 10*3/uL (ref 0.7–4.0)
MONOS PCT: 11 %
Monocytes Absolute: 0.8 10*3/uL (ref 0.1–1.0)
NEUTROS ABS: 4.9 10*3/uL (ref 1.7–7.7)
Neutrophils Relative %: 69 %

## 2016-08-14 LAB — I-STAT TROPONIN, ED: Troponin i, poc: 0.09 ng/mL (ref 0.00–0.08)

## 2016-08-14 LAB — URINALYSIS, ROUTINE W REFLEX MICROSCOPIC
Bilirubin Urine: NEGATIVE
Glucose, UA: NEGATIVE mg/dL
Hgb urine dipstick: NEGATIVE
Ketones, ur: NEGATIVE mg/dL
Leukocytes, UA: NEGATIVE
NITRITE: NEGATIVE
PH: 6 (ref 5.0–8.0)
Protein, ur: NEGATIVE mg/dL
SPECIFIC GRAVITY, URINE: 1.01 (ref 1.005–1.030)

## 2016-08-14 LAB — PROTIME-INR
INR: 1.23
Prothrombin Time: 15.6 seconds — ABNORMAL HIGH (ref 11.4–15.2)

## 2016-08-14 LAB — I-STAT CHEM 8, ED
BUN: 23 mg/dL — AB (ref 6–20)
CHLORIDE: 103 mmol/L (ref 101–111)
Calcium, Ion: 1.13 mmol/L — ABNORMAL LOW (ref 1.15–1.40)
Creatinine, Ser: 1.3 mg/dL — ABNORMAL HIGH (ref 0.61–1.24)
Glucose, Bld: 107 mg/dL — ABNORMAL HIGH (ref 65–99)
HEMATOCRIT: 45 % (ref 39.0–52.0)
Hemoglobin: 15.3 g/dL (ref 13.0–17.0)
POTASSIUM: 3.9 mmol/L (ref 3.5–5.1)
SODIUM: 138 mmol/L (ref 135–145)
TCO2: 25 mmol/L (ref 0–100)

## 2016-08-14 LAB — CBG MONITORING, ED: Glucose-Capillary: 110 mg/dL — ABNORMAL HIGH (ref 65–99)

## 2016-08-14 LAB — APTT: APTT: 35 s (ref 24–36)

## 2016-08-14 LAB — CK: CK TOTAL: 77 U/L (ref 49–397)

## 2016-08-14 LAB — BRAIN NATRIURETIC PEPTIDE: B Natriuretic Peptide: 106 pg/mL — ABNORMAL HIGH (ref 0.0–100.0)

## 2016-08-14 MED ORDER — ACETAMINOPHEN 325 MG PO TABS: 650.0000 mg | ORAL_TABLET | ORAL | Status: DC | PRN

## 2016-08-14 MED ORDER — ACETAMINOPHEN 650 MG RE SUPP: 650.0000 mg | RECTAL | Status: DC | PRN

## 2016-08-14 MED ORDER — ALBUTEROL SULFATE (2.5 MG/3ML) 0.083% IN NEBU: 2.5000 mg | INHALATION_SOLUTION | Freq: Four times a day (QID) | RESPIRATORY_TRACT | Status: DC | PRN

## 2016-08-14 MED ORDER — HEPARIN SODIUM (PORCINE) 5000 UNIT/ML IJ SOLN
5000.0000 [IU] | Freq: Three times a day (TID) | INTRAMUSCULAR | Status: DC
  Administered 2016-08-15 – 2016-08-19 (×14): 5000 [IU] via SUBCUTANEOUS
  Filled 2016-08-14 (×14): qty 1

## 2016-08-14 MED ORDER — ASPIRIN 81 MG PO CHEW: 324.0000 mg | CHEWABLE_TABLET | Freq: Once | ORAL | Status: DC

## 2016-08-14 MED ORDER — ASPIRIN 325 MG PO TABS: 325.0000 mg | ORAL_TABLET | Freq: Every day | ORAL | Status: DC

## 2016-08-14 MED ORDER — ASPIRIN 300 MG RE SUPP
300.0000 mg | Freq: Every day | RECTAL | Status: DC
  Administered 2016-08-15 – 2016-08-19 (×5): 300 mg via RECTAL
  Filled 2016-08-14 (×5): qty 1

## 2016-08-14 MED ORDER — PIPERACILLIN-TAZOBACTAM 3.375 G IVPB 30 MIN
3.3750 g | INTRAVENOUS | Status: AC
  Administered 2016-08-15: 3.375 g via INTRAVENOUS
  Filled 2016-08-14: qty 50

## 2016-08-14 MED ORDER — LEVOTHYROXINE SODIUM 100 MCG IV SOLR
25.0000 ug | Freq: Every day | INTRAVENOUS | Status: DC
  Administered 2016-08-15 – 2016-08-19 (×5): 25 ug via INTRAVENOUS
  Filled 2016-08-14 (×5): qty 5

## 2016-08-14 MED ORDER — ACETAMINOPHEN 160 MG/5ML PO SOLN: 650.0000 mg | ORAL | Status: DC | PRN

## 2016-08-14 MED ORDER — FAMOTIDINE IN NACL 20-0.9 MG/50ML-% IV SOLN
20.0000 mg | Freq: Two times a day (BID) | INTRAVENOUS | Status: DC
  Administered 2016-08-15 – 2016-08-17 (×6): 20 mg via INTRAVENOUS
  Filled 2016-08-14 (×6): qty 50

## 2016-08-14 MED ORDER — HYDRALAZINE HCL 20 MG/ML IJ SOLN: 5.0000 mg | INTRAMUSCULAR | Status: DC | PRN

## 2016-08-14 MED ORDER — PIPERACILLIN-TAZOBACTAM 3.375 G IVPB
3.3750 g | Freq: Three times a day (TID) | INTRAVENOUS | Status: DC
  Administered 2016-08-15 – 2016-08-18 (×10): 3.375 g via INTRAVENOUS
  Filled 2016-08-14 (×11): qty 50

## 2016-08-14 MED ORDER — STROKE: EARLY STAGES OF RECOVERY BOOK
Freq: Once | Status: AC
  Administered 2016-08-15: 02:00:00
  Filled 2016-08-14: qty 1

## 2016-08-14 NOTE — ED Provider Notes (Signed)
[facility]-EMERGENCY DEPT Provider Note   CSN: [id number] Arrival date & time: 08/14/16  1940     History   Chief Complaint No chief complaint on file.   HPI Craig COMUNALE is a 83 y.o. male.  The history is provided by a relative.  Altered Mental Status   This is a new problem. Episode onset: 3 days ago progressing from weakness over 2 weeks. The problem has not changed since onset.Associated symptoms include confusion (and aphasia over last 3 days). Risk factors: elderly. His past medical history does not include seizures, CVA or head trauma. Past medical history comments: mild dementia per family report but he is normally much more cognitively in tact.    Past Medical History:  Diagnosis Date  . Asthma   . 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 Chronic systolic CHF (congestive heart failure) (HCC)    a. 10/2011 Echo: EF 40-45%, inf AK, PASP .  . CKD (chronic kidney disease), stage III   . Coronary artery disease    a. Remote lateral infarct;  b. 09/2011 MV: large dense inferolat/lat scar - ? very slight peri-infarcrt isch.  No signif change c/w 09/2010 scan.  . Gout   . Hard of hearing    bilaterally  . Hyperlipidemia   . Hypertension   . Hypothyroidism   . Mitral insufficiency    chronically moderate to severe  . Noncompliance with medication regimen   . Persistent atrial fibrillation (HCC)   . PTSD (post-traumatic stress disorder)    "being treated for it now but not a big degree"  . Raynaud's disease /phenomenon     Patient Active Problem List   Diagnosis Date Noted  . Acute renal failure superimposed on stage 3 chronic kidney disease (HCC) 08/31/2015  . Sepsis (HCC) 08/31/2015  . Shingles 08/31/2015  . BPH (benign prostatic hyperplasia) 08/31/2015  . Hypothyroidism 08/31/2015  . HCAP (healthcare-associated pneumonia) 04/09/2014  . Respiratory failure with hypoxia (HCC) 04/09/2014  . Atrial fibrillation (HCC) 01/10/2014  . Symptomatic bradycardia  01/10/2014  . Fatigue 12/17/2013  . Atrial flutter (HCC) 10/18/2012  . Midsternal chest pain 10/18/2012  . Chronic systolic CHF (congestive heart failure) (HCC)   . Chest discomfort 04/08/2012  . Sinus bradycardia 10/21/2011  . Acute on chronic combined systolic and diastolic heart failure (HCC) 10/20/2011  . SOB (shortness of breath) on exertion 08/30/2011  . Coronary artery disease   . MI, old   . Mitral insufficiency   . Hypertension   . Hyperlipidemia   . Mobitz (type) I (Wenckebach's) atrioventricular block   . CAD (coronary artery disease)   . Raynaud phenomenon   . Dizziness     Past Surgical History:  Procedure Laterality Date  . APPENDECTOMY    . CARDIAC CATHETERIZATION  2003   SHOWED OCCLUSION OF THE LEFT CIRCUMFLEX CORONARY ARTERY  . INGUINAL HERNIA REPAIR     left  . LEFT HEART CATHETERIZATION WITH CORONARY ANGIOGRAM N/A 08/29/2011   Procedure: LEFT HEART CATHETERIZATION WITH CORONARY ANGIOGRAM;  Surgeon: Kathleene Hazel, MD;  Location: [facility] CATH LAB;  Service: Cardiovascular;  Laterality: N/A;  . PACEMAKER INSERTION  03/28/14   STJ single chamber pacemaker implanted by Dr Graciela Husbands for symptomatic bradycardia  . PERMANENT PACEMAKER INSERTION N/A 03/28/2014   Procedure: PERMANENT PACEMAKER INSERTION;  Surgeon: Duke Salvia, MD;  Location: [facility] CATH LAB;  Service: Cardiovascular;  Laterality: N/A;  . PROSTATECTOMY    . TONSILLECTOMY  Home Medications    Prior to Admission medications   Medication Sig Start Date End Date Taking? Authorizing Provider  allopurinol (ZYLOPRIM) 300 MG tablet Take 450 mg by mouth daily.     Historical Provider, MD  apixaban (ELIQUIS) 2.5 MG TABS tablet Take 1 tablet (2.5 mg total) by mouth 2 (two) times daily. 08/12/14   Peter M [place], MD  Cholecalciferol (VITAMIN D-3) 1000 UNITS CAPS Take 1,000 Units by mouth 2 (two) times daily.     Historical Provider, MD  finasteride (PROSCAR) 5 MG tablet Take 5 mg by mouth every evening.      Historical Provider, MD  furosemide (LASIX) 20 MG tablet Take 2 tablets (40 mg total) by mouth daily. 09/02/15   Calvert Cantor, MD  isosorbide mononitrate (IMDUR) 60 MG 24 hr tablet TAKE 1 TABLET BY MOUTH EVERY DAY 06/03/16   Peter M [place], MD  levothyroxine (SYNTHROID, LEVOTHROID) 50 MCG tablet Take 50 mcg by mouth daily before breakfast.    Historical Provider, MD  nitroGLYCERIN (NITROSTAT) 0.4 MG SL tablet Place 1 tablet (0.4 mg total) under the tongue every 5 (five) minutes as needed. x3 doses as needed for chest pain 10/18/12   Ok Anis, NP  omeprazole (PRILOSEC) 20 MG capsule Take 20 mg by mouth daily.     Historical Provider, MD  ranitidine (ZANTAC) 300 MG capsule Take 300 mg by mouth every evening.    Historical Provider, MD    Family History Family History  Problem Relation Age of Onset  . Heart attack Brother   . Other Mother     died in her late 39's - old age.  . Alcohol abuse Father     died in his late 38's    Social History Social History  Substance Use Topics  . Smoking status: Former Smoker    Packs/day: 1.50    Years: 20.00    Quit date: 12/12/1970  . Smokeless tobacco: Never Used  . Alcohol use No     Allergies   Zocor [simvastatin]   Review of Systems Review of Systems  Unable to perform ROS: Mental status change  Psychiatric/Behavioral: Positive for confusion (and aphasia over last 3 days).  All other systems reviewed and are negative.    Physical Exam Updated Vital Signs BP 129/89 (BP Location: Right Arm)   Pulse 86   Resp 24   Ht 6' (1.829 m)   Wt 174 lb (78.9 kg)   SpO2 100%   BMI 23.60 kg/m   Physical Exam  Constitutional: He is oriented to person, place, and time. He appears well-developed and well-nourished. No distress.  HENT:  Head: Normocephalic and atraumatic.  Nose: Nose normal.  Eyes: Conjunctivae are normal.  Neck: Neck supple. No tracheal deviation present.  Cardiovascular: Normal rate, regular rhythm and normal  heart sounds.   Pulmonary/Chest: Effort normal and breath sounds normal. No respiratory distress.  Abdominal: Soft. He exhibits no distension.  Neurological: He is alert and oriented to person, place, and time. GCS eye subscore is 4. GCS verbal subscore is 4. GCS motor subscore is 6.  Slight L>R facial weakness and asymmetry, strength 4/5 RLE 5/5 LLE, bilateral UE 5/5.   Skin: Skin is warm and dry.  Psychiatric: He has a normal mood and affect.     ED Treatments / Results  Labs (all labs ordered are listed, but only abnormal results are displayed) Labs Reviewed  PROTIME-INR - Abnormal; Notable for the following:  Result Value   Prothrombin Time 15.6 (*)    All other components within normal limits  COMPREHENSIVE METABOLIC PANEL - Abnormal; Notable for the following:    Glucose, Bld 112 (*)    Creatinine, Ser 1.41 (*)    GFR calc non Af Amer 41 (*)    GFR calc Af Amer 47 (*)    All other components within normal limits  BRAIN NATRIURETIC PEPTIDE - Abnormal; Notable for the following:    B Natriuretic Peptide 106.0 (*)    All other components within normal limits  I-STAT TROPOININ, ED - Abnormal; Notable for the following:    Troponin i, poc 0.09 (*)    All other components within normal limits  CBG MONITORING, ED - Abnormal; Notable for the following:    Glucose-Capillary 110 (*)    All other components within normal limits  I-STAT CHEM 8, ED - Abnormal; Notable for the following:    BUN 23 (*)    Creatinine, Ser 1.30 (*)    Glucose, Bld 107 (*)    Calcium, Ion 1.13 (*)    All other components within normal limits  APTT  CBC  DIFFERENTIAL  URINALYSIS, ROUTINE W REFLEX MICROSCOPIC  CK    EKG  EKG Interpretation  Date/Time:  Wednesday August 14 2016 20:41:39 EST Ventricular Rate:  66 PR Interval:    QRS Duration: 172 QT Interval:  555 QTC Calculation: 582 R Axis:   -73 Text Interpretation:  Atrial flutter Short PR interval Nonspecific IVCD with LAD LVH with  secondary repolarization abnormality Confirmed by Elizardo Chilson MD, Keyshaun Exley [phone]([id number]) on 08/14/2016 8:49:06 PM       Radiology Ct Head Wo Contrast  Result Date: 08/14/2016 CLINICAL DATA:  Stroke-like symptoms beginning 3 days ago. Lethargy and speech disturbance. History of dementia. EXAM: CT HEAD WITHOUT CONTRAST TECHNIQUE: Contiguous axial images were obtained from the base of the skull through the vertex without intravenous contrast. COMPARISON:  02/13/2015 FINDINGS: Brain: There is no evidence of acute cortical infarct, intracranial hemorrhage, mass, midline shift, or extra-axial fluid collection. Moderate cerebral atrophy is unchanged. Periventricular white matter hypodensities are unchanged and nonspecific but compatible with chronic small vessel ischemic disease, mild for age. Vascular: Calcified atherosclerosis at the skullbase. No hyperdense vessel. Skull: No fracture or suspicious osseous lesion. Sinuses/Orbits: Partially visualized chronic right maxillary sinusitis. Clear mastoid air cells. No acute abnormality in the imaged portions of the orbits. Other: Scattered tiny locules of gas in the cavernous sinuses and infratemporal fossae, possibly related to recent venipuncture. IMPRESSION: 1. No evidence of acute intracranial abnormality. 2. Unchanged cerebral atrophy and chronic small vessel ischemia. Electronically Signed   By: Sebastian AcheAllen  Grady M.D.   On: 08/14/2016 20:35   Dg Chest Port 1 View  Result Date: 08/14/2016 CLINICAL DATA:  Altered mental status EXAM: PORTABLE CHEST 1 VIEW COMPARISON:  06/19/2015 FINDINGS: Left-sided single lead pacing device with lead over the right ventricle. Mild left greater than right bibasilar opacities, atelectasis versus mild infiltrates. Stable cardiomegaly with atherosclerosis. No pneumothorax. IMPRESSION: Mild left greater than right bibasilar opacities suspicious for infiltrates. Stable mild cardiomegaly Electronically Signed   By: Jasmine PangKim  Fujinaga M.D.   On: 08/14/2016  21:17    Procedures Procedures (including critical care time)  Medications Ordered in ED Medications  aspirin chewable tablet 324 mg (not administered)     Initial Impression / Assessment and Plan / ED Course  I have reviewed the triage vital signs and the nursing notes.  Pertinent labs & imaging results  that were available during my care of the patient were reviewed by me and considered in my medical decision making (see chart for details).  Clinical Course     83 y.o. male presents with Weakness over the last 2 weeks that has resulted in 3 days of difficulty in speech, inability to ambulate to baseline function, and worsening incontinence of urine and stools and adult diapers.  Exam is slightly concerning with right-sided leg weakness, patient otherwise nonfocal and answering questions intermittently. CT without acute abnormalities. The rest of his lab workup is reassuring for lack of metabolic, hematologic, or infectious cause of symptoms. Troponin is slightly elevated at 0.09 with history of heart failure. There are bilateral opacities on x-ray concerning for early pneumonia versus fluid accumulation but BNP is normal. May be secondary to aspiration. Mild hypothermia was corrected with external warming. Discussed the case with Dr. Otelia Limes of neurology who agreed to come see the patient in consultation as we cannot perform MRI due to pacemaker implantation.  Hospitalist was consulted for admission and will see the patient in the emergency department.   Final Clinical Impressions(s) / ED Diagnoses   Final diagnoses:  Altered mental state  Encephalopathy acute  Weakness  Troponin level elevated   New Prescriptions New Prescriptions   No medications on file     Lyndal Pulley, MD 08/15/16 [phone]

## 2016-08-14 NOTE — H&P (Addendum)
History and Physical    [name]:[id number] DOB: 1919/06/19 DOA: 08/14/2016  Referring MD/NP/PA:   PCP: [name], MD   Patient coming from:  The patient is coming from home.  At baseline, pt is partially dependent for most of ADL.   Chief Complaint:  AMS, poor balance and weakness, cough  HPI: Craig [name] is a 83 y.o. male with medical history significant of hypertension, hyperlipidemia, asthma, hypothyroidism, gout, pacemaker placement, mitral valve insufficiency, CAD, PTSD, chronic kidney disease-stage III, systolic congestive heart failure with EF for 45-50 percent, atrial fibrillation on Eliquis, BPH, shingles, who presents with AMS, poor balance and weakness, cough.  Per pt's daughter, patient has become confused in the past 3-4 days. He has generalized weakness and poor balance. He seems to have leg weakness and cannot walk. He does not talk any more. He seems to have difficulty swallowing and chocking when eating food. He also has dry cough and mild right lower chest pain when he coughs. Patient does not have nausea, vomiting, abdominal pain or diarrhea per her daughter. No fever or chills.  ED Course: pt was found to have WBC 7.19, negative urinalysis, INR 1.23, troponin 0.09, stable renal function, hyperthermia with body temperature 95.8, oxygen saturation 100% on room air, negative CT for acute intracranial abnormalities. Chest x-ray showed possible bilateral basilar opacity. Pt is placed on tele bed for obs.  Neuro, Dr.Lindzen was consulted by EDP.  Review of Systems: Could not be reviewed due to altered mental status. Allergy:  Allergies  Allergen Reactions  . Zocor [Simvastatin] Other (See Comments)    Unknown reaction per pt    Past Medical History:  Diagnosis Date  . Asthma   . 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 Chronic systolic CHF (congestive heart failure) (HCC)    a. 10/2011 Echo: EF 40-45%, inf AK, PASP .  . CKD (chronic  kidney disease), stage III   . Coronary artery disease    a. Remote lateral infarct;  b. 09/2011 MV: large dense inferolat/lat scar - ? very slight peri-infarcrt isch.  No signif change c/w 09/2010 scan.  . Gout   . Hard of hearing    bilaterally  . Hyperlipidemia   . Hypertension   . Hypothyroidism   . Mitral insufficiency    chronically moderate to severe  . Noncompliance with medication regimen   . Persistent atrial fibrillation (HCC)   . PTSD (post-traumatic stress disorder)    "being treated for it now but not a big degree"  . Raynaud's disease /phenomenon     Past Surgical History:  Procedure Laterality Date  . APPENDECTOMY    . CARDIAC CATHETERIZATION  2003   SHOWED OCCLUSION OF THE LEFT CIRCUMFLEX CORONARY ARTERY  . INGUINAL HERNIA REPAIR     left  . LEFT HEART CATHETERIZATION WITH CORONARY ANGIOGRAM N/A 08/29/2011   Procedure: LEFT HEART CATHETERIZATION WITH CORONARY ANGIOGRAM;  Surgeon: Kathleene Hazel, MD;  Location: [facility] CATH LAB;  Service: Cardiovascular;  Laterality: N/A;  . PACEMAKER INSERTION  03/28/14   STJ single chamber pacemaker implanted by Dr Graciela Husbands for symptomatic bradycardia  . PERMANENT PACEMAKER INSERTION N/A 03/28/2014   Procedure: PERMANENT PACEMAKER INSERTION;  Surgeon: Duke Salvia, MD;  Location: [facility] CATH LAB;  Service: Cardiovascular;  Laterality: N/A;  . PROSTATECTOMY    . TONSILLECTOMY      Social History:  reports that he quit smoking about 45 years ago. He has a 30.00 pack-year  smoking history. He has never used smokeless tobacco. He reports that he does not drink alcohol or use drugs.  Family History:  Family History  Problem Relation Age of Onset  . Heart attack Brother   . Other Mother     died in her late 26's - old age.  . Alcohol abuse Father     died in his late 79's     Prior to Admission medications   Medication Sig Start Date End Date Taking? Authorizing Provider  allopurinol (ZYLOPRIM) 300 MG tablet Take 450 mg by mouth  daily.     Historical Provider, MD  apixaban (ELIQUIS) 2.5 MG TABS tablet Take 1 tablet (2.5 mg total) by mouth 2 (two) times daily. 08/12/14   Peter M [place], MD  Cholecalciferol (VITAMIN D-3) 1000 UNITS CAPS Take 1,000 Units by mouth 2 (two) times daily.     Historical Provider, MD  finasteride (PROSCAR) 5 MG tablet Take 5 mg by mouth every evening.     Historical Provider, MD  furosemide (LASIX) 20 MG tablet Take 2 tablets (40 mg total) by mouth daily. 09/02/15   Calvert Cantor, MD  isosorbide mononitrate (IMDUR) 60 MG 24 hr tablet TAKE 1 TABLET BY MOUTH EVERY DAY 06/03/16   Peter M [place], MD  levothyroxine (SYNTHROID, LEVOTHROID) 50 MCG tablet Take 50 mcg by mouth daily before breakfast.    Historical Provider, MD  nitroGLYCERIN (NITROSTAT) 0.4 MG SL tablet Place 1 tablet (0.4 mg total) under the tongue every 5 (five) minutes as needed. x3 doses as needed for chest pain 10/18/12   Ok Anis, NP  omeprazole (PRILOSEC) 20 MG capsule Take 20 mg by mouth daily.     Historical Provider, MD  ranitidine (ZANTAC) 300 MG capsule Take 300 mg by mouth every evening.    Historical Provider, MD    Physical Exam: Vitals:   08/14/16 2000 08/14/16 2009 08/14/16 2130 08/14/16 2143  BP: 129/89 129/89 126/84   Pulse:  86 65   Resp:  24 16   Temp:    (!) 95.8 F (35.4 C)  TempSrc:  Oral  Rectal  SpO2:  100% 100%   Weight:  78.9 kg (174 lb)    Height:  6' (1.829 m)     General: Not in acute distress HEENT:       Eyes: PERRL, EOMI, no scleral icterus.       ENT: No discharge from the ears and nose, no pharynx injection, no tonsillar enlargement.        Neck: No JVD, no bruit, no mass felt. Heme: No neck lymph node enlargement. Cardiac: S1/S2, RRR, No murmurs, No gallops or rubs. Respiratory: has rhonchi bilaterally. No rales or rubs. GI: Soft, nondistended, nontender, no organomegaly, BS present. GU: No hematuria Ext: No pitting leg edema bilaterally. 2+DP/PT pulse  bilaterally. Musculoskeletal: No joint deformities, No joint redness or warmth, no limitation of ROM in spin. Skin: No rashes.  Neuro: Confused, non-verbal, not oriented X3, has mild right facial droop. Knee reflex 1+ bilaterally. Negative Babinski's sign.  Psych: Patient is not psychotic, no suicidal or hemocidal ideation.  Labs on Admission: I have personally reviewed following labs and imaging studies  CBC:  Recent Labs Lab 08/14/16 2102 08/14/16 2116  WBC 7.1  --   NEUTROABS 4.9  --   HGB 14.3 15.3  HCT 40.1 45.0  MCV 82.9  --   PLT 195  --    Basic Metabolic Panel:  Recent Labs Lab 08/14/16 2102  08/14/16 2116  NA 136 138  K 3.9 3.9  CL 103 103  CO2 23  --   GLUCOSE 112* 107*  BUN 19 23*  CREATININE 1.41* 1.30*  CALCIUM 9.8  --    GFR: Estimated Creatinine Clearance: 37.3 mL/min (by C-G formula based on SCr of 1.3 mg/dL (H)). Liver Function Tests:  Recent Labs Lab 08/14/16 2102  AST 27  ALT 22  ALKPHOS 113  BILITOT 0.6  PROT 7.3  ALBUMIN 3.8   No results for input(s): LIPASE, AMYLASE in the last 168 hours. No results for input(s): AMMONIA in the last 168 hours. Coagulation Profile:  Recent Labs Lab 08/14/16 2102  INR 1.23   Cardiac Enzymes:  Recent Labs Lab 08/14/16 2106  CKTOTAL 77   BNP (last 3 results) No results for input(s): PROBNP in the last 8760 hours. HbA1C: No results for input(s): HGBA1C in the last 72 hours. CBG:  Recent Labs Lab 08/14/16 2008  GLUCAP 110*   Lipid Profile: No results for input(s): CHOL, HDL, LDLCALC, TRIG, CHOLHDL, LDLDIRECT in the last 72 hours. Thyroid Function Tests: No results for input(s): TSH, T4TOTAL, FREET4, T3FREE, THYROIDAB in the last 72 hours. Anemia Panel: No results for input(s): VITAMINB12, FOLATE, FERRITIN, TIBC, IRON, RETICCTPCT in the last 72 hours. Urine analysis:    Component Value Date/Time   COLORURINE YELLOW 08/14/2016 2145   APPEARANCEUR CLEAR 08/14/2016 2145   LABSPEC  1.010 08/14/2016 2145   PHURINE 6.0 08/14/2016 2145   GLUCOSEU NEGATIVE 08/14/2016 2145   HGBUR NEGATIVE 08/14/2016 2145   BILIRUBINUR NEGATIVE 08/14/2016 2145   KETONESUR NEGATIVE 08/14/2016 2145   PROTEINUR NEGATIVE 08/14/2016 2145   UROBILINOGEN 1.0 02/13/2015 2248   NITRITE NEGATIVE 08/14/2016 2145   LEUKOCYTESUR NEGATIVE 08/14/2016 2145   Sepsis Labs: @LABRCNTIP (procalcitonin:4,lacticidven:4) )No results found for this or any previous visit (from the past 240 hour(s)).   Radiological Exams on Admission: Ct Head Wo Contrast  Result Date: 08/14/2016 CLINICAL DATA:  Stroke-like symptoms beginning 3 days ago. Lethargy and speech disturbance. History of dementia. EXAM: CT HEAD WITHOUT CONTRAST TECHNIQUE: Contiguous axial images were obtained from the base of the skull through the vertex without intravenous contrast. COMPARISON:  02/13/2015 FINDINGS: Brain: There is no evidence of acute cortical infarct, intracranial hemorrhage, mass, midline shift, or extra-axial fluid collection. Moderate cerebral atrophy is unchanged. Periventricular white matter hypodensities are unchanged and nonspecific but compatible with chronic small vessel ischemic disease, mild for age. Vascular: Calcified atherosclerosis at the skullbase. No hyperdense vessel. Skull: No fracture or suspicious osseous lesion. Sinuses/Orbits: Partially visualized chronic right maxillary sinusitis. Clear mastoid air cells. No acute abnormality in the imaged portions of the orbits. Other: Scattered tiny locules of gas in the cavernous sinuses and infratemporal fossae, possibly related to recent venipuncture. IMPRESSION: 1. No evidence of acute intracranial abnormality. 2. Unchanged cerebral atrophy and chronic small vessel ischemia. Electronically Signed   By: Sebastian AcheAllen  Grady M.D.   On: 08/14/2016 20:35   Dg Chest Port 1 View  Result Date: 08/14/2016 CLINICAL DATA:  Altered mental status EXAM: PORTABLE CHEST 1 VIEW COMPARISON:  06/19/2015  FINDINGS: Left-sided single lead pacing device with lead over the right ventricle. Mild left greater than right bibasilar opacities, atelectasis versus mild infiltrates. Stable cardiomegaly with atherosclerosis. No pneumothorax. IMPRESSION: Mild left greater than right bibasilar opacities suspicious for infiltrates. Stable mild cardiomegaly Electronically Signed   By: Jasmine PangKim  Fujinaga M.D.   On: 08/14/2016 21:17     EKG: Independently reviewed.  Atrial fibrillation, QTC 602,  T-wave inversion in lateral leads in precordial leads   Assessment/Plan Principal Problem:   Acute encephalopathy Active Problems:   Hypertension   CAD (coronary artery disease)   Chronic systolic CHF (congestive heart failure) (HCC)   Atrial fibrillation (HCC)   BPH (benign prostatic hyperplasia)   Hypothyroidism   Elevated troponin   CKD (chronic kidney disease), stage III   Aspiration pneumonia (HCC)   Acute encephalopathy and possible stroke: Patient has some mild right facial droop, poor balance, nonverbal, concerning for stroke. Ct-head negative. Neuro, dr. Otelia Limes was consulted. Pt has pacemaker, cannot to MRI.  - will place on tele bed for obs - hold all oral meds now, including Eliquis - will follow up Neurology's Recs.  - Check carotid dopplers  - start ASA per rectal - pt is allergic to statin - fasting lipid panel and HbA1c  - 2D transthoracic echocardiography  - PT/OT consult  Possible Aspiration pneumonia: Patient has cough and right lower chest pain. Chest x-ray showed a possible bilateral basilar infiltration, indicating possible aspiration pneumonia.  - IV zosyn - prn Albuterol Nebs for SOB - Urine S. pneumococcal antigen - Follow up blood culture x2, sputum culture  Chronic systolic CHF (congestive heart failure) (HCC): 2-D echo on 07/15/14 showed EF of 45-50 percent. No leg edema. CHF is compensated. -Hold Lasix due to worsening renal function -Check BMP  Elevated trop and hx of CAD:  trop 0.09. Likely due to demand ischemia - cycle CE q6 x3 and repeat her EKG in the am  - aspirin - pt is allergic to statin - Risk factor stratification: will check FLP and A1C  - 2d echo  Hypertension: -Hold home oral meds -IV hydralazine.  GERD: -IV pepced  Hypothyroidism: Last TSH was 3.54 on 11/12/13 -Continue home Synthroid, but switch to IV and cut dose by half  Atrial Fibrillation: CHA2DS2-VASc Score is 4, needs oral anticoagulation. Patient is on Eliquis at home. INR is pending on admission. Heart rate is well controlled without CCB or BB. -Hold oral Eliquis.  -because of concerning for stroke, will not start IV heparin  BPH: stable - Continue Proscar  CKD (chronic kidney disease), stage III: Stable. Baseline creatinine 1./08/25/2015. His creatinine is 1.41 -f/u renal Fx by BMP.    DVT ppx: SQ Heparin   Code Status: DNR Family Communication:  Yes, patient's  Daughter at bed side Disposition Plan:  Anticipate discharge back to previous home environment Consults called:  Neuro, Dr.Lindzen was consulted by EDP. Admission status: Obs / tele    Date of Service 08/14/2016    Lorretta Harp [facility] Pager [phone]  If 7PM-7AM, please contact night-coverage [email or web address] Password [email or web address] 08/14/2016, 11:14 PM

## 2016-08-14 NOTE — Progress Notes (Signed)
Pharmacy Antibiotic Note  Craig [name] is a 83 y.o. male admitted on 08/14/2016 with possible asp PNA.  Pharmacy has been consulted for Zosyn dosing.  Plan: Zosyn 3.375gm IV now over 30 min then 3.375gm IV q8h - subsequent doses over 4 hours  Will f/u micro data, renal function, and pt's clinical condition  Height: 6' (182.9 cm) Weight: 174 lb (78.9 kg) IBW/kg (Calculated) : 77.6  Temp (24hrs), Avg:95.8 F (35.4 C), Min:95.8 F (35.4 C), Max:95.8 F (35.4 C)   Recent Labs Lab 08/14/16 2102 08/14/16 2116  WBC 7.1  --   CREATININE 1.41* 1.30*    Estimated Creatinine Clearance: 37.3 mL/min (by C-G formula based on SCr of 1.3 mg/dL (H)).    Allergies  Allergen Reactions  . Zocor [Simvastatin] Other (See Comments)    Unknown reaction per pt    Antimicrobials this admission: 1/10 Zosyn >>   Microbiology results: Pending  Thank you for allowing pharmacy to be a part of this patient's care.  Christoper Fabianaron Anyelo Mccue, PharmD, BCPS Clinical pharmacist, pager [phone] 08/14/2016 11:20 PM

## 2016-08-14 NOTE — ED Triage Notes (Signed)
Per [facility]   Family call EMS out for stroke like symptoms since Sunday. Lethargic. Pt has has had some confused speech. Pt was nonverbal for EMS originally. Some what appropriate. Pt has a hx of dementia. 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  20 LH

## 2016-08-15 ENCOUNTER — Observation Stay (HOSPITAL_BASED_OUTPATIENT_CLINIC_OR_DEPARTMENT_OTHER): Payer: Medicare Other

## 2016-08-15 ENCOUNTER — Observation Stay (HOSPITAL_COMMUNITY): Payer: Medicare Other

## 2016-08-15 DIAGNOSIS — Z7189 Other specified counseling: Secondary | ICD-10-CM | POA: Diagnosis not present

## 2016-08-15 DIAGNOSIS — I480 Paroxysmal atrial fibrillation: Secondary | ICD-10-CM | POA: Diagnosis not present

## 2016-08-15 DIAGNOSIS — I10 Essential (primary) hypertension: Secondary | ICD-10-CM | POA: Diagnosis not present

## 2016-08-15 DIAGNOSIS — R748 Abnormal levels of other serum enzymes: Secondary | ICD-10-CM | POA: Diagnosis not present

## 2016-08-15 DIAGNOSIS — I4892 Unspecified atrial flutter: Secondary | ICD-10-CM | POA: Diagnosis present

## 2016-08-15 DIAGNOSIS — E785 Hyperlipidemia, unspecified: Secondary | ICD-10-CM | POA: Diagnosis present

## 2016-08-15 DIAGNOSIS — Z950 Presence of cardiac pacemaker: Secondary | ICD-10-CM | POA: Diagnosis not present

## 2016-08-15 DIAGNOSIS — Z7901 Long term (current) use of anticoagulants: Secondary | ICD-10-CM | POA: Diagnosis not present

## 2016-08-15 DIAGNOSIS — I130 Hypertensive heart and chronic kidney disease with heart failure and stage 1 through stage 4 chronic kidney disease, or unspecified chronic kidney disease: Secondary | ICD-10-CM | POA: Diagnosis present

## 2016-08-15 DIAGNOSIS — Z8249 Family history of ischemic heart disease and other diseases of the circulatory system: Secondary | ICD-10-CM | POA: Diagnosis not present

## 2016-08-15 DIAGNOSIS — I638 Other cerebral infarction: Secondary | ICD-10-CM | POA: Diagnosis not present

## 2016-08-15 DIAGNOSIS — I5022 Chronic systolic (congestive) heart failure: Secondary | ICD-10-CM | POA: Diagnosis not present

## 2016-08-15 DIAGNOSIS — K219 Gastro-esophageal reflux disease without esophagitis: Secondary | ICD-10-CM | POA: Diagnosis present

## 2016-08-15 DIAGNOSIS — J690 Pneumonitis due to inhalation of food and vomit: Secondary | ICD-10-CM | POA: Diagnosis not present

## 2016-08-15 DIAGNOSIS — Z811 Family history of alcohol abuse and dependence: Secondary | ICD-10-CM | POA: Diagnosis not present

## 2016-08-15 DIAGNOSIS — E039 Hypothyroidism, unspecified: Secondary | ICD-10-CM | POA: Diagnosis present

## 2016-08-15 DIAGNOSIS — I2510 Atherosclerotic heart disease of native coronary artery without angina pectoris: Secondary | ICD-10-CM | POA: Diagnosis not present

## 2016-08-15 DIAGNOSIS — I2583 Coronary atherosclerosis due to lipid rich plaque: Secondary | ICD-10-CM | POA: Diagnosis not present

## 2016-08-15 DIAGNOSIS — R4702 Dysphasia: Secondary | ICD-10-CM | POA: Diagnosis present

## 2016-08-15 DIAGNOSIS — I481 Persistent atrial fibrillation: Secondary | ICD-10-CM | POA: Diagnosis present

## 2016-08-15 DIAGNOSIS — Z66 Do not resuscitate: Secondary | ICD-10-CM | POA: Diagnosis present

## 2016-08-15 DIAGNOSIS — R4182 Altered mental status, unspecified: Secondary | ICD-10-CM | POA: Diagnosis not present

## 2016-08-15 DIAGNOSIS — Z888 Allergy status to other drugs, medicaments and biological substances status: Secondary | ICD-10-CM | POA: Diagnosis not present

## 2016-08-15 DIAGNOSIS — I248 Other forms of acute ischemic heart disease: Secondary | ICD-10-CM | POA: Diagnosis present

## 2016-08-15 DIAGNOSIS — I639 Cerebral infarction, unspecified: Secondary | ICD-10-CM | POA: Diagnosis not present

## 2016-08-15 DIAGNOSIS — N183 Chronic kidney disease, stage 3 (moderate): Secondary | ICD-10-CM | POA: Diagnosis not present

## 2016-08-15 DIAGNOSIS — R4701 Aphasia: Secondary | ICD-10-CM | POA: Diagnosis present

## 2016-08-15 DIAGNOSIS — I482 Chronic atrial fibrillation: Secondary | ICD-10-CM | POA: Diagnosis not present

## 2016-08-15 DIAGNOSIS — G9340 Encephalopathy, unspecified: Secondary | ICD-10-CM | POA: Diagnosis not present

## 2016-08-15 DIAGNOSIS — Z87891 Personal history of nicotine dependence: Secondary | ICD-10-CM | POA: Diagnosis not present

## 2016-08-15 DIAGNOSIS — N400 Enlarged prostate without lower urinary tract symptoms: Secondary | ICD-10-CM | POA: Diagnosis present

## 2016-08-15 DIAGNOSIS — Z515 Encounter for palliative care: Secondary | ICD-10-CM | POA: Diagnosis not present

## 2016-08-15 DIAGNOSIS — N179 Acute kidney failure, unspecified: Secondary | ICD-10-CM | POA: Diagnosis present

## 2016-08-15 DIAGNOSIS — J189 Pneumonia, unspecified organism: Secondary | ICD-10-CM | POA: Diagnosis not present

## 2016-08-15 DIAGNOSIS — F4310 Post-traumatic stress disorder, unspecified: Secondary | ICD-10-CM | POA: Diagnosis present

## 2016-08-15 LAB — TROPONIN I
TROPONIN I: 0.12 ng/mL — AB (ref <0.03)
TROPONIN I: 0.12 ng/mL — AB (ref <0.03)
TROPONIN I: 0.13 ng/mL — AB (ref <0.03)

## 2016-08-15 LAB — VAS US CAROTID
LCCADDIAS: -12 cm/s
LCCAPDIAS: -9 cm/s
LCCAPSYS: -50 cm/s
LEFT ECA DIAS: -6 cm/s
LICADSYS: -51 cm/s
LICAPSYS: -43 cm/s
Left CCA dist sys: -50 cm/s
Left ICA dist dias: -11 cm/s
Left ICA prox dias: -7 cm/s
RCCADSYS: -42 cm/s
RCCAPSYS: 60 cm/s
RIGHT ECA DIAS: -9 cm/s
RIGHT VERTEBRAL DIAS: -4 cm/s
Right CCA prox dias: 9 cm/s

## 2016-08-15 LAB — ECHOCARDIOGRAM COMPLETE
Height: 72 in
Weight: 2784 oz

## 2016-08-15 LAB — LIPID PANEL
Cholesterol: 138 mg/dL (ref 0–200)
HDL: 52 mg/dL (ref >40)
LDL Cholesterol: 78 mg/dL (ref 0–99)
TRIGLYCERIDES: 39 mg/dL (ref <150)
Total CHOL/HDL Ratio: 2.7 RATIO
VLDL: 8 mg/dL (ref 0–40)

## 2016-08-15 LAB — STREP PNEUMONIAE URINARY ANTIGEN: Strep Pneumo Urinary Antigen: NEGATIVE

## 2016-08-15 MED ORDER — DEXTROSE-NACL 5-0.45 % IV SOLN
INTRAVENOUS | Status: DC
  Administered 2016-08-15: 21:00:00 via INTRAVENOUS

## 2016-08-15 MED ORDER — LORAZEPAM 2 MG/ML IJ SOLN
1.0000 mg | Freq: Once | INTRAMUSCULAR | Status: AC
  Administered 2016-08-15: 1 mg via INTRAVENOUS
  Filled 2016-08-15: qty 1

## 2016-08-15 NOTE — Progress Notes (Signed)
VASCULAR LAB PRELIMINARY  PRELIMINARY  PRELIMINARY  PRELIMINARY  Carotid duplex completed.    Preliminary report:  Bilateral:  1-39% ICA stenosis.  Vertebral artery flow is antegrade on the right.The left vertebral artery could no be visualized due to movement.    Kamla Skilton, RVS 08/15/2016, 4:50 PM

## 2016-08-15 NOTE — Evaluation (Addendum)
Clinical/Bedside Swallow Evaluation Patient Details  Name: Craig [name] MRN: [id number] Date of Birth: [birth year]  Today's Date: 08/15/2016 Time: SLP Start Time (ACUTE ONLY): 1517 SLP Stop Time (ACUTE ONLY): 1527 SLP Time Calculation (min) (ACUTE ONLY): 10 min  Past Medical History:  Past Medical History:  Diagnosis Date  . Asthma   . 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 Chronic systolic CHF (congestive heart failure) (HCC)    a. 10/2011 Echo: EF 40-45%, inf AK, PASP 57mmHg.  . CKD (chronic kidney disease), stage III   . Coronary artery disease    a. Remote lateral infarct;  b. 09/2011 MV: large dense inferolat/lat scar - ? very slight peri-infarcrt isch.  No signif change c/w 09/2010 scan.  . Gout   . Hard of hearing    bilaterally  . Hyperlipidemia   . Hypertension   . Hypothyroidism   . Mitral insufficiency    chronically moderate to severe  . Noncompliance with medication regimen   . Persistent atrial fibrillation (HCC)   . PTSD (post-traumatic stress disorder)    "being treated for it now but not a big degree"  . Raynaud's disease /phenomenon    Past Surgical History:  Past Surgical History:  Procedure Laterality Date  . APPENDECTOMY    . CARDIAC CATHETERIZATION  2003   SHOWED OCCLUSION OF THE LEFT CIRCUMFLEX CORONARY ARTERY  . INGUINAL HERNIA REPAIR     left  . LEFT HEART CATHETERIZATION WITH CORONARY ANGIOGRAM N/A 08/29/2011   Procedure: LEFT HEART CATHETERIZATION WITH CORONARY ANGIOGRAM;  Surgeon: Kathleene Hazelhristopher D McAlhany, MD;  [facility];  Service: Cardiovascular;  Laterality: N/A;  . PACEMAKER INSERTION  03/28/14   STJ single chamber pacemaker implanted by Dr Graciela HusbandsKlein for symptomatic bradycardia  . PERMANENT PACEMAKER INSERTION N/A 03/28/2014   Procedure: PERMANENT PACEMAKER INSERTION;  Surgeon: Duke SalviaSteven C Klein, MD;  [facility];  Service: Cardiovascular;  Laterality: N/A;  . PROSTATECTOMY    . TONSILLECTOMY     HPI:  83 y.o. male with  medical history significant of hypertension, hyperlipidemia, asthma, hypothyroidism, gout, pacemaker placement, mitral valve insufficiency, CAD, PTSD, chronic kidney disease-stage III, systolic congestive heart failure with EF for 45-50 percent, atrial fibrillation on Eliquis, BPH, shingles, who presents with AMS, poor balance and weakness, cough; CXR 08/14/16 indicated Mild left greater than right bibasilar opacities suspicious for infiltrates. CT head on 08/14/16 indicated 1. No evidence of acute intracranial abnormality. 2. Unchanged cerebral atrophy and chronic small vessel ischemia.   Assessment / Plan / Recommendation Clinical Impression   Pt with immediate cough after presentation of ice chips, removed from oral cavity; pt could not cognitively produce a cough and swallow initiation was absent during BSE; family reports he "stopped swallowing" and would hold various medications/food/liquids prior to arrival to hospital on 08/14/16.  Recommend NPO status at this time and ST will f/u for po readiness.    Aspiration Risk  Severe aspiration risk;Risk for inadequate nutrition/hydration    Diet Recommendation   NPO  Medication Administration: Via alternative means    Other  Recommendations Oral Care Recommendations: Oral care QID   Follow up Recommendations Other (comment) (TBD)      Frequency and Duration min 2x/week  1 week       Prognosis Prognosis for Safe Diet Advancement: Fair Barriers to Reach Goals: Cognitive deficits      Swallow Study   General Date of Onset: 08/14/16 HPI: 83 y.o. male with medical history  significant of hypertension, hyperlipidemia, asthma, hypothyroidism, gout, pacemaker placement, mitral valve insufficiency, CAD, PTSD, chronic kidney disease-stage III, systolic congestive heart failure with EF for 45-50 percent, atrial fibrillation on Eliquis, BPH, shingles, who presents with AMS, poor balance and weakness, cough Type of Study: Bedside Swallow  Evaluation Previous Swallow Assessment:  (failed NSSS) Diet Prior to this Study: NPO Temperature Spikes Noted: No Respiratory Status: Room air History of Recent Intubation: No Behavior/Cognition: Alert;Confused;Cooperative;Distractible Oral Cavity Assessment: Within Functional Limits Oral Care Completed by SLP: Recent completion by staff Oral Cavity - Dentition: Missing dentition Self-Feeding Abilities: Needs assist;Needs set up Patient Positioning: Upright in bed Baseline Vocal Quality: Low vocal intensity;Breathy Volitional Cough: Cognitively unable to elicit Volitional Swallow: Unable to elicit    Oral/Motor/Sensory Function Overall Oral Motor/Sensory Function: Other (comment) (Pt u/a to participate >1-step directives)   Ice Chips Ice chips: Impaired Presentation: Spoon Oral Phase Impairments: Reduced lingual movement/coordination Oral Phase Functional Implications: Oral holding;Prolonged oral transit Pharyngeal Phase Impairments: Suspected delayed Swallow;Unable to trigger swallow;Cough - Immediate   Thin Liquid Thin Liquid: Not tested    Nectar Thick Nectar Thick Liquid: Not tested   Honey Thick Honey Thick Liquid: Not tested   Puree Puree: Not tested   Solid      Solid: Not tested    Functional Assessment Tool Used: NOMS Functional Limitations: Swallowing Swallow Current Status ([id number]): At least 80 percent but less than 100 percent impaired, limited or restricted Swallow Goal Status [phone]): At least 60 percent but less than 80 percent impaired, limited or restricted   ADAMS,PAT, M.S., CCC-SLP 08/15/2016,4:01 PM

## 2016-08-15 NOTE — Progress Notes (Signed)
PROGRESS NOTE [facility]   LINKYN GOBIN   XBJ:[id number] DOB: [birth year]  DOA: 08/14/2016 PCP: [name], MD   Brief Narrative:  83 year old male with past medical history of hypertension, hypothyroidism, CAD, C KD stage III, systolic CHF, AV block status post PPM presented today the after being found by his daughter with AMS and generalized weakness. In the ED CT head negative for acute intracranial abnormalities but MRI could not be done because of pacemaker. Chest x-ray show bilateral basilar opacity. Patient admitted for stroke workup and antibiotics for possible aspiration pneumonia.  I discussed patient with daughter, she reported that patient has been weak for about a week or so with decrease on oral intake. Patient was having difficulty swallowing and one day prior to admission he could not walk at all. She report the patient is doing much better today he is more awake and alert and also his speech is much clear.  Subjective: Patient seen and examined. He reports no pain or concern. Patient is confused but alert.  Assessment & Plan: Acute encephalopathy and possible stroke - doubt stroke giving generalized weakness - initial CT with no acute abnormalities -Repeat CT after hydration in a.m. -No MRI due to pacemaker -AMS could be secondary to patient pneumonia noted on checks x-ray - daughter report difficulty swallowing and eating the previous week -ASA 325 -Neurology recommendations appreciated -Swallow eval pending  Possible Aspiration pneumonia -Continue IV antibiotic -Follow-up cultures -Repeat checks x-ray in the morning  Chronic systolic CHF - no signs of fluid overload Monitor for signs of fluid overload given patient receiving fluids Medications on hold -until pass swallow eval  Elevated trop and hx of CAD Likely demand ischemia Radiology recommendations appreciated - given advanced patient functional status no further  workup  Hypertension - stable Monitor BP  Atrial Flutter/Fib - heart rate well controlled, on home Eliquis, CHADVASC 4 Given currently nothing by mouth Eliquis is on hold IV heparin indicated at this time Continue to monitor in telemetry  CKD (chronic kidney disease), stage III: - Improved with IV fluid Check BMP in a.m.  DVT prophylaxis: Sq Heparin  Code Status: DNR Family Communication: Discussed with daughter via phone Disposition Plan: Home vs SNF when medically clear   Consultants:   Neurology   Cardiology   Procedures:   ECHO   Carotid Doppler   Antimicrobials:  Zosyn 08/14/16   Objective: Vitals:   08/15/16 1030 08/15/16 1100 08/15/16 1157 08/15/16 1357  BP: 116/82 130/67 122/73 133/90  Pulse: 70 64 61 62  Resp: 18 16 16 16   Temp:      TempSrc:      SpO2: 100% 100% 98% 99%  Weight:      Height:        Intake/Output Summary (Last 24 hours) at 08/15/16 1716 Last data filed at 08/15/16 1007  Gross per 24 hour  Intake              100 ml  Output                0 ml  Net              100 ml   Filed Weights   08/14/16 2009  Weight: 78.9 kg (174 lb)    Examination:  General exam: Frail, comfortable, NAD HEENT: AC/AT, PERRLA, OP moist and clear Respiratory system: Clear to auscultation. No wheezes,crackle or rhonchi Cardiovascular system: S1S2 Irr Irr S4 4/6 SM No JVD, rubs or gallops Gastrointestinal system:  Abdomen is nondistended, soft and nontender. No organomegaly or masses felt. Normal bowel sounds heard. Central nervous system: Alert and oriented to self. Weakness to the upper lower extremity right more than left, exam limited, follow commands Extremities: No pedal edema Skin: No rashes, lesions or ulcers Psychiatry: Judgement and insight appear normal. Mood & affect appropriate.    Data Reviewed: I have personally reviewed following labs and imaging studies  CBC:  Recent Labs Lab 08/14/16 2102 08/14/16 2116  WBC 7.1  --    NEUTROABS 4.9  --   HGB 14.3 15.3  HCT 40.1 45.0  MCV 82.9  --   PLT 195  --    Basic Metabolic Panel:  Recent Labs Lab 08/14/16 2102 08/14/16 2116  NA 136 138  K 3.9 3.9  CL 103 103  CO2 23  --   GLUCOSE 112* 107*  BUN 19 23*  CREATININE 1.41* 1.30*  CALCIUM 9.8  --    GFR: Estimated Creatinine Clearance: 37.3 mL/min (by C-G formula based on SCr of 1.3 mg/dL (H)). Liver Function Tests:  Recent Labs Lab 08/14/16 2102  AST 27  ALT 22  ALKPHOS 113  BILITOT 0.6  PROT 7.3  ALBUMIN 3.8   No results for input(s): LIPASE, AMYLASE in the last 168 hours. No results for input(s): AMMONIA in the last 168 hours. Coagulation Profile:  Recent Labs Lab 08/14/16 2102  INR 1.23   Cardiac Enzymes:  Recent Labs Lab 08/14/16 0018 08/14/16 2106 08/15/16 0447 08/15/16 1141  CKTOTAL  --  77  --   --   TROPONINI 0.12*  --  0.12* 0.13*   BNP (last 3 results) No results for input(s): PROBNP in the last 8760 hours. HbA1C: No results for input(s): HGBA1C in the last 72 hours. CBG:  Recent Labs Lab 08/14/16 2008  GLUCAP 110*   Lipid Profile:  Recent Labs  08/15/16 0447  CHOL 138  HDL 52  LDLCALC 78  TRIG 39  CHOLHDL 2.7   Thyroid Function Tests: No results for input(s): TSH, T4TOTAL, FREET4, T3FREE, THYROIDAB in the last 72 hours. Anemia Panel: No results for input(s): VITAMINB12, FOLATE, FERRITIN, TIBC, IRON, RETICCTPCT in the last 72 hours. Sepsis Labs: No results for input(s): PROCALCITON, LATICACIDVEN in the last 168 hours.  No results found for this or any previous visit (from the past 240 hour(s)).    Radiology Studies: Ct Head Wo Contrast  Result Date: 08/14/2016 CLINICAL DATA:  Stroke-like symptoms beginning 3 days ago. Lethargy and speech disturbance. History of dementia. EXAM: CT HEAD WITHOUT CONTRAST TECHNIQUE: Contiguous axial images were obtained from the base of the skull through the vertex without intravenous contrast. COMPARISON:   02/13/2015 FINDINGS: Brain: There is no evidence of acute cortical infarct, intracranial hemorrhage, mass, midline shift, or extra-axial fluid collection. Moderate cerebral atrophy is unchanged. Periventricular white matter hypodensities are unchanged and nonspecific but compatible with chronic small vessel ischemic disease, mild for age. Vascular: Calcified atherosclerosis at the skullbase. No hyperdense vessel. Skull: No fracture or suspicious osseous lesion. Sinuses/Orbits: Partially visualized chronic right maxillary sinusitis. Clear mastoid air cells. No acute abnormality in the imaged portions of the orbits. Other: Scattered tiny locules of gas in the cavernous sinuses and infratemporal fossae, possibly related to recent venipuncture. IMPRESSION: 1. No evidence of acute intracranial abnormality. 2. Unchanged cerebral atrophy and chronic small vessel ischemia. Electronically Signed   By: Sebastian Ache M.D.   On: 08/14/2016 20:35   Dg Chest Port 1 View  Result Date: 08/14/2016  CLINICAL DATA:  Altered mental status EXAM: PORTABLE CHEST 1 VIEW COMPARISON:  06/19/2015 FINDINGS: Left-sided single lead pacing device with lead over the right ventricle. Mild left greater than right bibasilar opacities, atelectasis versus mild infiltrates. Stable cardiomegaly with atherosclerosis. No pneumothorax. IMPRESSION: Mild left greater than right bibasilar opacities suspicious for infiltrates. Stable mild cardiomegaly Electronically Signed   By: Jasmine PangKim  Fujinaga M.D.   On: 08/14/2016 21:17    Scheduled Meds: . aspirin  300 mg Rectal Daily   Or  . aspirin  325 mg Oral Daily  . famotidine (PEPCID) IV  20 mg Intravenous Q12H  . heparin  5,000 Units Subcutaneous Q8H  . levothyroxine  25 mcg Intravenous Daily  . piperacillin-tazobactam (ZOSYN)  IV  3.375 g Intravenous Q8H   Continuous Infusions:   LOS: 0 days    Latrelle DodrillEdwin Silva, MD [facility] Pager [phone]  If 7PM-7AM, please contact  night-coverage [email or web address] Password [email or web address] 08/15/2016, 5:16 PM

## 2016-08-15 NOTE — Consult Note (Signed)
[facility]UR[facility] HOSPITALIST CONSULT NOTE   Requestig physician: Dr. Clyde Lundborg  Reason for Consult: Aphasia with right sided weakness  History obtained from:   Chart    HPI:                                                                                                                                          Craig [name] is an 83 y.o. male who presented for evaluation of confusion progressing over the past 3 days. He also has exhibited generalized weakness with poor balance. He has not been able to walk and family feels that this is due to leg weakness. He has had difficulty swallowing and has exhibited choking when eating. Recent symptoms include a dry cough and he has had right lower chest pain in conjunction with the cough. No fever or chills were endorsed on initial presentation. Of note, he was hypothermic in the ED.   His PMHx includes HTN, HLD, hypothyroidism, gout, asthma, pacemaker, CAD, PTSD, CKD3, CHF, atrial fibrillation, shingles and BPH. He is on Eliquis for his atrial fibrillation.   Past Medical History:  Diagnosis Date  . Asthma   . 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 Chronic systolic CHF (congestive heart failure) (HCC)    a. 10/2011 Echo: EF 40-45%, inf AK, PASP .  . CKD (chronic kidney disease), stage III   . Coronary artery disease    a. Remote lateral infarct;  b. 09/2011 MV: large dense inferolat/lat scar - ? very slight peri-infarcrt isch.  No signif change c/w 09/2010 scan.  . Gout   . Hard of hearing    bilaterally  . Hyperlipidemia   . Hypertension   . Hypothyroidism   . Mitral insufficiency    chronically moderate to severe  . Noncompliance with medication regimen   . Persistent atrial fibrillation (HCC)   . PTSD (post-traumatic stress disorder)    "being treated for it now but not a big degree"  . Raynaud's disease /phenomenon     Past Surgical History:  Procedure Laterality Date  . APPENDECTOMY    . CARDIAC CATHETERIZATION  2003    SHOWED OCCLUSION OF THE LEFT CIRCUMFLEX CORONARY ARTERY  . INGUINAL HERNIA REPAIR     left  . LEFT HEART CATHETERIZATION WITH CORONARY ANGIOGRAM N/A 08/29/2011   Procedure: LEFT HEART CATHETERIZATION WITH CORONARY ANGIOGRAM;  Surgeon: Kathleene Hazel, MD;  Location: [facility] CATH LAB;  Service: Cardiovascular;  Laterality: N/A;  . PACEMAKER INSERTION  03/28/14   STJ single chamber pacemaker implanted by Dr Graciela Husbands for symptomatic bradycardia  . PERMANENT PACEMAKER INSERTION N/A 03/28/2014   Procedure: PERMANENT PACEMAKER INSERTION;  Surgeon: Duke Salvia, MD;  Location: [facility] CATH LAB;  Service: Cardiovascular;  Laterality: N/A;  . PROSTATECTOMY    .  TONSILLECTOMY      Family History  Problem Relation Age of Onset  . Heart attack Brother   . Other Mother     died in her late 55's - old age.  . Alcohol abuse Father     died in his late 51's   Social History:  reports that he quit smoking about 45 years ago. He has a 30.00 pack-year smoking history. He has never used smokeless tobacco. He reports that he does not drink alcohol or use drugs.  Allergies  Allergen Reactions  . Zocor [Simvastatin] Other (See Comments)    Unknown reaction per pt    MEDICATIONS:                                                                                                                     Prior to Admission: allopurinol (ZYLOPRIM) 300 MG tablet Take 450 mg by mouth daily.  Lorretta Harp, MD Not Ordered  apixaban (ELIQUIS) 2.5 MG TABS tablet Take 1 tablet (2.5 mg total) by mouth 2 (two) times daily. Lorretta Harp, MD Not Ordered  Cholecalciferol (VITAMIN D-3) 1000 UNITS CAPS Take 1,000 Units by mouth 2 (two) times daily.  Lorretta Harp, MD Not Ordered  finasteride (PROSCAR) 5 MG tablet Take 5 mg by mouth every evening.  Lorretta Harp, MD Not Ordered  furosemide (LASIX) 20 MG tablet Take 2 tablets (40 mg total) by mouth daily. Lorretta Harp, MD Not Ordered  isosorbide mononitrate (IMDUR) 60 MG 24 hr tablet TAKE 1 TABLET BY  MOUTH EVERY DAY Lorretta Harp, MD Not Ordered  nitroGLYCERIN (NITROSTAT) 0.4 MG SL tablet Place 1 tablet (0.4 mg total) under the tongue every 5 (five) minutes as needed. x3 doses as needed for chest pain Lorretta Harp, MD Not Ordered  omeprazole (PRILOSEC) 20 MG capsule Take 20 mg by mouth daily.  Lorretta Harp, MD Not Ordered  ranitidine (ZANTAC) 300 MG capsule Take 300 mg by mouth every evening. Lorretta Harp, MD Not Ordered    ROS:                                                                                                                                       Unable to obtain due to AMS.   Blood pressure 133/82, pulse 64, temperature (!) 95.8 F (35.4 C), temperature source Rectal, resp. rate 18, height 6' (1.829 m), weight 78.9 kg (174 lb), SpO2 100 %.  General Examination:                                                                                                      HEENT-  Normocephalic/atraumatic.  Lungs- Gross wheezing noted. Intermittent tachypnea with respirations up to 30's/min Extremities- Warm and well perfused  Neurological Examination Mental Status: Awake. Poverty of thought. Minimal speech output. Poor eye contact. Receptive and expressive dysphasia. Has difficulty following commands requiring frequent repetition and tactile stimulation to initiate motor responses.  Cranial Nerves: II: Blinks to threat in right and left visual fields. PERRL.   III,IV, VI: Gazes conjugately to left and right with saccadic pursuits noted.  V,VII: No definite facial droop. Reacts to facial light touch bilaterally VIII: hearing intact to questions and commands.  IX,X: palate rises symmetrically XI: No definite asymmetry XII: midline tongue extension Motor: RUE and RLE with decreased excursion and slight delay in initiation of movements relative to left. LUE and LLE 4+/5.  Sensory: Reacts to FT x 4.  Deep Tendon Reflexes: Hyperactive x 4 without definite asymmetry.  Plantars: Equivocal  bilaterally.  Cerebellar: Slow FNF bilaterally. No definite ataxia.  Gait: Deferred.   Lab Results: Basic Metabolic Panel:  Recent Labs Lab 08/14/16 2102 08/14/16 2116  NA 136 138  K 3.9 3.9  CL 103 103  CO2 23  --   GLUCOSE 112* 107*  BUN 19 23*  CREATININE 1.41* 1.30*  CALCIUM 9.8  --     Liver Function Tests:  Recent Labs Lab 08/14/16 2102  AST 27  ALT 22  ALKPHOS 113  BILITOT 0.6  PROT 7.3  ALBUMIN 3.8   No results for input(s): LIPASE, AMYLASE in the last 168 hours. No results for input(s): AMMONIA in the last 168 hours.  CBC:  Recent Labs Lab 08/14/16 2102 08/14/16 2116  WBC 7.1  --   NEUTROABS 4.9  --   HGB 14.3 15.3  HCT 40.1 45.0  MCV 82.9  --   PLT 195  --     Cardiac Enzymes:  Recent Labs Lab 08/14/16 0018 08/14/16 2106  CKTOTAL  --  77  TROPONINI 0.12*  --     Lipid Panel: No results for input(s): CHOL, TRIG, HDL, CHOLHDL, VLDL, LDLCALC in the last 168 hours.  CBG:  Recent Labs Lab 08/14/16 2008  GLUCAP 110*    Microbiology: Results for orders placed or performed during the hospital encounter of 08/31/15  Culture, blood (x 2)     Status: None   Collection Time: 08/31/15 11:20 PM  Result Value Ref Range Status   Specimen Description BLOOD RIGHT HAND  Final   Special Requests BOTTLES DRAWN AEROBIC AND ANAEROBIC 10CC  Final   Culture NO GROWTH 5 DAYS  Final   Report Status 09/06/2015 FINAL  Final  Culture, blood (x 2)     Status: None   Collection Time: 08/31/15 11:20 PM  Result Value Ref Range Status   Specimen Description BLOOD LEFT HAND  Final   Special Requests BOTTLES DRAWN AEROBIC AND ANAEROBIC 5CC  Final   Culture  Setup  Time   Final    GRAM POSITIVE COCCI IN CLUSTERS ANAEROBIC BOTTLE ONLY CRITICAL RESULT CALLED TO, READ BACK BY AND VERIFIED WITH: DR. Katrinka Blazing 09/04/15 @ 1531 M VESTAL    Culture   Final    STAPHYLOCOCCUS SPECIES (COAGULASE NEGATIVE) THE SIGNIFICANCE OF ISOLATING THIS ORGANISM FROM A SINGLE SET OF  BLOOD CULTURES WHEN MULTIPLE SETS ARE DRAWN IS UNCERTAIN. PLEASE NOTIFY THE MICROBIOLOGY DEPARTMENT WITHIN ONE WEEK IF SPECIATION AND SENSITIVITIES ARE REQUIRED.    Report Status 09/06/2015 FINAL  Final    Coagulation Studies:  Recent Labs  08/14/16 2102  LABPROT 15.6*  INR 1.23    Imaging: Ct Head Wo Contrast  Result Date: 08/14/2016 CLINICAL DATA:  Stroke-like symptoms beginning 3 days ago. Lethargy and speech disturbance. History of dementia. EXAM: CT HEAD WITHOUT CONTRAST TECHNIQUE: Contiguous axial images were obtained from the base of the skull through the vertex without intravenous contrast. COMPARISON:  02/13/2015 FINDINGS: Brain: There is no evidence of acute cortical infarct, intracranial hemorrhage, mass, midline shift, or extra-axial fluid collection. Moderate cerebral atrophy is unchanged. Periventricular white matter hypodensities are unchanged and nonspecific but compatible with chronic small vessel ischemic disease, mild for age. Vascular: Calcified atherosclerosis at the skullbase. No hyperdense vessel. Skull: No fracture or suspicious osseous lesion. Sinuses/Orbits: Partially visualized chronic right maxillary sinusitis. Clear mastoid air cells. No acute abnormality in the imaged portions of the orbits. Other: Scattered tiny locules of gas in the cavernous sinuses and infratemporal fossae, possibly related to recent venipuncture. IMPRESSION: 1. No evidence of acute intracranial abnormality. 2. Unchanged cerebral atrophy and chronic small vessel ischemia. Electronically Signed   By: Sebastian Ache M.D.   On: 08/14/2016 20:35   Dg Chest Port 1 View  Result Date: 08/14/2016 CLINICAL DATA:  Altered mental status EXAM: PORTABLE CHEST 1 VIEW COMPARISON:  06/19/2015 FINDINGS: Left-sided single lead pacing device with lead over the right ventricle. Mild left greater than right bibasilar opacities, atelectasis versus mild infiltrates. Stable cardiomegaly with atherosclerosis. No  pneumothorax. IMPRESSION: Mild left greater than right bibasilar opacities suspicious for infiltrates. Stable mild cardiomegaly Electronically Signed   By: Jasmine Pang M.D.   On: 08/14/2016 21:17    Assessment: 83 year old male with three day history of worsening dysphasia with right sided weakness 1. CT head shows no evidence of acute intracranial abnormality. There is unchanged cerebral atrophy and chronic small vessel ischemia. A small subacute lacunar stroke or small scattered left MCA strokes not visible on CT are on the DDx. Other DDx includes subclinical seizure with postictal state and toxic/metabolic etiology for confusion with unmasking of possible pre-existing mild motor deficit.  2. Stroke risk factors include age, HLD, HTN and atrial fibrillation.   Recommendations: 1. Unable to perform MRI due to pacemaker. 2. Gentle IV hydration x 24 hours if able given CHF.  3. Following hydration, obtain CTA of head and neck.  4. TTE.  5. Telemetry.  6. Continue Eliquis.  7. PT/OT/Speech.  8. Evaluation for possible toxic or metabolic etiologies for his AMS.  9. Given advanced age, risks of statin most likely outweigh potential benefits.  10. Out of permissive HTN time window.    Electronically signed: Dr. Caryl Pina 08/15/2016, 2:17 AM

## 2016-08-15 NOTE — Consult Note (Signed)
Cardiology Consult    Patient ID: Craig [name] MRN: [id number], DOB/AGE: March 15, 1919   Admit date: 08/14/2016 Date of Consult: 08/15/2016  Primary Physician: Craig Patella, MD Primary Cardiologist: Craig [name] Requesting Provider: Dr. Clyde [name] Reason for Consultation: elevated trop  Patient Profile    83 yo male with PMH of HTN, HL, hypothyroidism, CAD, CKD III, Chronic systolic HF, AV block/AF s/p PPM and mod-serv MR who presented to the ED after a fall, AMS and weakness.   Past Medical History   Past Medical History:  Diagnosis Date  . Asthma   . 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 Chronic systolic CHF (congestive heart failure) (HCC)    a. 10/2011 Echo: EF 40-45%, inf AK, PASP .  . CKD (chronic kidney disease), stage III   . Coronary artery disease    a. Remote lateral infarct;  b. 09/2011 MV: large dense inferolat/lat scar - ? very slight peri-infarcrt isch.  No signif change c/w 09/2010 scan.  . Gout   . Hard of hearing    bilaterally  . Hyperlipidemia   . Hypertension   . Hypothyroidism   . Mitral insufficiency    chronically moderate to severe  . Noncompliance with medication regimen   . Persistent atrial fibrillation (HCC)   . PTSD (post-traumatic stress disorder)    "being treated for it now but not a big degree"  . Raynaud's disease /phenomenon     Past Surgical History:  Procedure Laterality Date  . APPENDECTOMY    . CARDIAC CATHETERIZATION  2003   SHOWED OCCLUSION OF THE LEFT CIRCUMFLEX CORONARY ARTERY  . INGUINAL HERNIA REPAIR     left  . LEFT HEART CATHETERIZATION WITH CORONARY ANGIOGRAM N/A 08/29/2011   Procedure: LEFT HEART CATHETERIZATION WITH CORONARY ANGIOGRAM;  Surgeon: Craig Hazel, MD;  Location: [facility] CATH LAB;  Service: Cardiovascular;  Laterality: N/A;  . PACEMAKER INSERTION  03/28/14   STJ single chamber pacemaker implanted by Dr Craig [name] for symptomatic bradycardia  . PERMANENT PACEMAKER INSERTION N/A 03/28/2014   Procedure: PERMANENT PACEMAKER INSERTION;  Surgeon: Craig Salvia, MD;  Location: [facility] CATH LAB;  Service: Cardiovascular;  Laterality: N/A;  . PROSTATECTOMY    . TONSILLECTOMY       Allergies  Allergies  Allergen Reactions  . Zocor [Simvastatin] Other (See Comments)    Unknown reaction per pt    History of Present Illness    [name] is a 83 yo male with PMH of HTN, HL, hypothyroidism, CAD, CKD III, Chronic systolic HF, AV block/AF s/p PPM, and mod-serv MR. He is followed by Dr. Swaziland and Craig [name] in the office.   He has a history of CAD with chronic occlusion of the LCx- last cardiac cath in 2003. He also has a history of CHF with EF of 45-50%- by Echo December 2015. He had moderate  MR managed medically. He has a history of atrial flutter and is on Eliquis. He has a history of Mobitz type 1 AV block when in NSR. Prior Holter monitor in 9/13 showed AV block without indication for pacemaker.  In May 2015 he wore an event monitor  showing atrial flutter with slow ventricular response as low as 40 bpm. He underwent pacemaker implant in August 2015. He was last seen by Dr. Swaziland in the office on 6/17 and was without complaints. Overall reported doing well but reported a further decline in activity level. No changes were made. He then followed up with  Craig [name] on 9/17, his device showed normal function. Did report progressive dyspnea, felt to be 2/2 his known MVR.  His daughter lives with him and helps as needed with ADLs. Reports he has been fairly independent, and does most of his care for himself, doesn't cook though. States he was in his USOH until about 2 weeks ago, though has noticed a functional  Decline over the past couple of months. Reports they had a lot of family in for christmas. Noticed the weeks after that he seemed to be more fatigued, stayed in bed mostly. Also has decreased PO intake. Begin not eating or drinking on Sunday prior to admission. They tried giving him baby food  and ice cream but noticed he made no attempt to swallow, and coughed if he did. Reports some baseline dementia, but seemed to be more confused than his norm. States he had a fall at 3am yesterday morning, and was incontinent. That afternoon developed some right sided weakness, and right facial droop. Came to the ED.   In the ED labs showed stable electrolytes, Cr 1.4, BNP 106, trop 0.12>>0.13, Hgb 14.3. CXR with stable cardiomegaly, but concern for infiltrates. CT head neg, unable to do MRI given PPM. Neurology was consulted, recommended  CTA head/Neck after gentle hydration given Cr. Oral medications held on admission, as there is concern for aspiration. SLP consulted.   Inpatient Medications    . aspirin  300 mg Rectal Daily   Or  . aspirin  325 mg Oral Daily  . famotidine (PEPCID) IV  20 mg Intravenous Q12H  . heparin  5,000 Units Subcutaneous Q8H  . levothyroxine  25 mcg Intravenous Daily  . piperacillin-tazobactam (ZOSYN)  IV  3.375 g Intravenous Q8H    Family History    Family History  Problem Relation Age of Onset  . Heart attack Brother   . Other Mother     died in her late 1's - old age.  . Alcohol abuse Father     died in his late 17's    Social History    Social History   Social History  . Marital status: Widowed    Spouse name: N/A  . Number of children: 8  . Years of education: N/A   Occupational History  . Chief Technology Officer Retired   Social History Main Topics  . Smoking status: Former Smoker    Packs/day: 1.50    Years: 20.00    Quit date: 12/12/1970  . Smokeless tobacco: Never Used  . Alcohol use No  . Drug use: No  . Sexual activity: No   Other Topics Concern  . Not on file   Social History Narrative   Lives in [place] - currently by himself as his wife is in a nsg home.  He is active and visits his wife multiple x/day.     Review of Systems    General:  No chills, fever, night sweats or weight changes.  Cardiovascular:  No chest pain,  dyspnea on exertion, edema, orthopnea, palpitations, paroxysmal nocturnal dyspnea. Dermatological: No rash, lesions/masses Respiratory: No cough, dyspnea Urologic: No hematuria, dysuria Abdominal:   No nausea, vomiting, diarrhea, bright red blood per rectum, melena, or hematemesis Neurologic:  See HPI All other systems reviewed and are otherwise negative except as noted above.  Physical Exam    Blood pressure 133/90, pulse 62, temperature (!) 95.8 F (35.4 C), temperature source Rectal, resp. rate 16, height 6' (1.829 m), weight 174 lb (78.9 kg), SpO2 99 %.  General: Pleasant thin, frail older AA male, NAD Psych: Normal affect. Neuro: Alert and oriented to self. Moves all extremities spontaneously, weakness to right U/LE HEENT: Normal  Neck: Supple without bruits or JVD. Lungs:  Resp regular and unlabored, CTA. Heart: Irreg Irreg no s3, s4, 4/6 systolic murmur. Abdomen: Soft, non-tender, non-distended, BS + x 4.  Extremities: No clubbing, cyanosis or edema. DP/PT/Radials 2+ and equal bilaterally.  Labs    Troponin [facility] of Care Test)  Recent Labs  08/14/16 2114  TROPIPOC 0.09*    Recent Labs  08/14/16 0018 08/14/16 2106 08/15/16 0447 08/15/16 1141  CKTOTAL  --  77  --   --   TROPONINI 0.12*  --  0.12* 0.13*   Lab Results  Component Value Date   WBC 7.1 08/14/2016   HGB 15.3 08/14/2016   HCT 45.0 08/14/2016   MCV 82.9 08/14/2016   PLT 195 08/14/2016    Recent Labs Lab 08/14/16 2102 08/14/16 2116  NA 136 138  K 3.9 3.9  CL 103 103  CO2 23  --   BUN 19 23*  CREATININE 1.41* 1.30*  CALCIUM 9.8  --   PROT 7.3  --   BILITOT 0.6  --   ALKPHOS 113  --   ALT 22  --   AST 27  --   GLUCOSE 112* 107*   Lab Results  Component Value Date   CHOL 138 08/15/2016   HDL 52 08/15/2016   LDLCALC 78 08/15/2016   TRIG 39 08/15/2016   Lab Results  Component Value Date   DDIMER 0.92 (H) 08/29/2011     Radiology Studies    Ct Head Wo Contrast  Result Date:  08/14/2016 CLINICAL DATA:  Stroke-like symptoms beginning 3 days ago. Lethargy and speech disturbance. History of dementia. EXAM: CT HEAD WITHOUT CONTRAST TECHNIQUE: Contiguous axial images were obtained from the base of the skull through the vertex without intravenous contrast. COMPARISON:  02/13/2015 FINDINGS: Brain: There is no evidence of acute cortical infarct, intracranial hemorrhage, mass, midline shift, or extra-axial fluid collection. Moderate cerebral atrophy is unchanged. Periventricular white matter hypodensities are unchanged and nonspecific but compatible with chronic small vessel ischemic disease, mild for age. Vascular: Calcified atherosclerosis at the skullbase. No hyperdense vessel. Skull: No fracture or suspicious osseous lesion. Sinuses/Orbits: Partially visualized chronic right maxillary sinusitis. Clear mastoid air cells. No acute abnormality in the imaged portions of the orbits. Other: Scattered tiny locules of gas in the cavernous sinuses and infratemporal fossae, possibly related to recent venipuncture. IMPRESSION: 1. No evidence of acute intracranial abnormality. 2. Unchanged cerebral atrophy and chronic small vessel ischemia. Electronically Signed   By: Sebastian Ache M.D.   On: 08/14/2016 20:35   Dg Chest Port 1 View  Result Date: 08/14/2016 CLINICAL DATA:  Altered mental status EXAM: PORTABLE CHEST 1 VIEW COMPARISON:  06/19/2015 FINDINGS: Left-sided single lead pacing device with lead over the right ventricle. Mild left greater than right bibasilar opacities, atelectasis versus mild infiltrates. Stable cardiomegaly with atherosclerosis. No pneumothorax. IMPRESSION: Mild left greater than right bibasilar opacities suspicious for infiltrates. Stable mild cardiomegaly Electronically Signed   By: Jasmine Pang M.D.   On: 08/14/2016 21:17    ECG & Cardiac Imaging    EKG: Atrial Flutter, LVH  Echo: 12/15  Study Conclusions  - Left ventricle: Posterior lateral hypokinesis. The  cavity size was mildly dilated. Systolic function was mildly reduced. The estimated ejection fraction was in the range of 45% to 50%. - Mitral valve: Restricted  posterior leaflet motion. There was moderate regurgitation. - Left atrium: The atrium was moderately dilated. - Right atrium: The atrium was mildly dilated. - Atrial septum: No defect or patent foramen ovale was identified. - Pulmonary arteries: PA peak pressure: 43 mm Hg (S).  Assessment & Plan    83 yo male with PMH of HTN, HL, hypothyroidism, CAD, CKD III, Chronic systolic HF, AV block/AF s/p PPM and mod-serv MR who presented to the ED after a fall, AMS and weakness.   1. Elevated Trop: Cycled with flat non ACS trend, likely demand ischemia. Previous cath in '03 with lateral infarct, then '07 with occ LCx treated medically, seems to have been stable since that time. Denies any chest pain prior to/during admission. EKG showed atrial flutter rate controlled.  -- Given advanced age and function status would not pursue further work up.   2. AMS: Family reports a functional status decline over the past couple of months. Symptoms seemed to worsen on Sunday, then fell yesterday. Also with right sided weakness. Seems somewhat improved today, as he is able to state his name. CT head was negative, no MRI given his PPM. Neuro saw in consultation.  -- Work up per primary, SLP at the bedside noting cough with attempted ice chips. Remains NPO.  3. Mod-Serv MR: Noted on echo in 2015. Seems volume stable on exam.   4. AV block/ AF s/p PPM: EKG showed atrial flutter on admission. Hx of permanent AF, on home Eliquis. Currently NPO with SLP following.   5. Chronic systolic HF: Volume stable on exam  6. CKD III: Cr improved since admission to 1.13 today.  Janice CoffinSigned, Jazion Atteberry, NP-C Pager [phone] 08/15/2016, 3:24 PM

## 2016-08-15 NOTE — Progress Notes (Signed)
Pt is back on the floor

## 2016-08-15 NOTE — Progress Notes (Signed)
  Echocardiogram 2D Echocardiogram has been performed.  Leta JunglingCooper, Gweneth Fredlund M 08/15/2016, 3:13 PM

## 2016-08-15 NOTE — Progress Notes (Signed)
Pt leaving floor for Carotid with Julio AlmMiranda Carter.

## 2016-08-15 NOTE — Evaluation (Signed)
Speech Language Pathology Evaluation Patient Details Name: Craig [name] MRN: [id number] DOB: 1918/09/24 Today's Date: 08/15/2016 Time: 1191-4782 SLP Time Calculation (min) (ACUTE ONLY): 16 min  Problem List:  Patient Active Problem List   Diagnosis Date Noted  . Acute encephalopathy 08/14/2016  . Elevated troponin 08/14/2016  . CKD (chronic kidney disease), stage III 08/14/2016  . Aspiration pneumonia (HCC) 08/14/2016  . Acute renal failure superimposed on stage 3 chronic kidney disease (HCC) 08/31/2015  . Sepsis (HCC) 08/31/2015  . Shingles 08/31/2015  . BPH (benign prostatic hyperplasia) 08/31/2015  . Hypothyroidism 08/31/2015  . HCAP (healthcare-associated pneumonia) 04/09/2014  . Respiratory failure with hypoxia (HCC) 04/09/2014  . Atrial fibrillation (HCC) 01/10/2014  . Symptomatic bradycardia 01/10/2014  . Fatigue 12/17/2013  . Atrial flutter (HCC) 10/18/2012  . Midsternal chest pain 10/18/2012  . Chronic systolic CHF (congestive heart failure) (HCC)   . Chest discomfort 04/08/2012  . Sinus bradycardia 10/21/2011  . Acute on chronic combined systolic and diastolic heart failure (HCC) 10/20/2011  . SOB (shortness of breath) on exertion 08/30/2011  . Coronary artery disease   . MI, old   . Mitral insufficiency   . Hypertension   . Hyperlipidemia   . Mobitz (type) I (Wenckebach's) atrioventricular block   . CAD (coronary artery disease)   . Raynaud phenomenon   . Dizziness    Past Medical History:  Past Medical History:  Diagnosis Date  . Asthma   . 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 Chronic systolic CHF (congestive heart failure) (HCC)    a. 10/2011 Echo: EF 40-45%, inf AK, PASP .  . CKD (chronic kidney disease), stage III   . Coronary artery disease    a. Remote lateral infarct;  b. 09/2011 MV: large dense inferolat/lat scar - ? very slight peri-infarcrt isch.  No signif change c/w 09/2010 scan.  . Gout   . Hard of hearing    bilaterally  .  Hyperlipidemia   . Hypertension   . Hypothyroidism   . Mitral insufficiency    chronically moderate to severe  . Noncompliance with medication regimen   . Persistent atrial fibrillation (HCC)   . PTSD (post-traumatic stress disorder)    "being treated for it now but not a big degree"  . Raynaud's disease /phenomenon    Past Surgical History:  Past Surgical History:  Procedure Laterality Date  . APPENDECTOMY    . CARDIAC CATHETERIZATION  2003   SHOWED OCCLUSION OF THE LEFT CIRCUMFLEX CORONARY ARTERY  . INGUINAL HERNIA REPAIR     left  . LEFT HEART CATHETERIZATION WITH CORONARY ANGIOGRAM N/A 08/29/2011   Procedure: LEFT HEART CATHETERIZATION WITH CORONARY ANGIOGRAM;  Surgeon: Kathleene Hazel, MD;  Location[facility] CATH LAB;  Service: Cardiovascular;  Laterality: N/A;  . PACEMAKER INSERTION  03/28/14   STJ single chamber pacemaker implanted by Dr Graciela Husbands for symptomatic bradycardia  . PERMANENT PACEMAKER INSERTION N/A 03/28/2014   Procedure: PERMANENT PACEMAKER INSERTION;  Surgeon: Duke Salvia, MD;  Location: [facility] CATH LAB;  Service: Cardiovascular;  Laterality: N/A;  . PROSTATECTOMY    . TONSILLECTOMY     HPI:  83 y.o. male with medical history significant of hypertension, hyperlipidemia, asthma, hypothyroidism, gout, pacemaker placement, mitral valve insufficiency, CAD, PTSD, chronic kidney disease-stage III, systolic congestive heart failure with EF for 45-50 percent, atrial fibrillation on Eliquis, BPH, shingles, who presents with AMS, poor balance and weakness, cough   Assessment / Plan / Recommendation Clinical Impression  Pt only able to follow simple 1-step directives; pt is at baseline with cognition per family; he is speaking less, but this has improved since admission as well per family.  Pt responded to SLP with automatic responses such as "I'm fine, how are you?" and simple yes/no responses, although they were inconsistent; oriented to self only; ST to f/u x1 for return to  baseline cognition based on family report; following for dysphagia as well as this is a new occurrence per family as pt was eating prior to last few days; now holding liquids/foods and exhibiting overall dysphagia with absent swallow response noted during BSE.    SLP Assessment  Patient needs continued Speech Language Pathology Services    Follow Up Recommendations  Other (comment)    Frequency and Duration min 2x/week  1 week      SLP Evaluation Cognition  Overall Cognitive Status: History of cognitive impairments - at baseline Arousal/Alertness: Awake/alert Orientation Level: Oriented to person Memory: Impaired Memory Impairment: Retrieval deficit;Decreased recall of new information;Decreased short term memory;Other (comment) (baseline deficits) Decreased Short Term Memory: Verbal basic;Functional basic Awareness: Impaired Problem Solving: Impaired Problem Solving Impairment: Verbal basic;Functional basic Behaviors: Restless Safety/Judgment: Impaired       Comprehension  Auditory Comprehension Overall Auditory Comprehension: Impaired at baseline Yes/No Questions: Impaired Commands: Impaired One Step Basic Commands: 25-49% accurate Conversation: Simple Visual Recognition/Discrimination Discrimination: Not tested Reading Comprehension Reading Status: Not tested    Expression Expression Primary Mode of Expression: Other (comment) Verbal Expression Overall Verbal Expression: Impaired at baseline Initiation: Impaired Level of Generative/Spontaneous Verbalization: Phrase Repetition: Impaired Level of Impairment: Word level Naming: Not tested Pragmatics: Unable to assess Interfering Components: Premorbid deficit Non-Verbal Means of Communication: Gestures Other Verbal Expression Comments:  (automatic responses only noted during SLE) Written Expression Dominant Hand: Right Written Expression: Unable to assess (comment) (cognition)   Oral / Motor  Oral Motor/Sensory  Function Overall Oral Motor/Sensory Function: Other (comment) Motor Speech Overall Motor Speech: Other (comment) (DNA) Respiration: Within functional limits Phonation: Other (comment) (CNA; breathy with automatic phrases) Intelligibility: Unable to assess (comment) Motor Planning: Not tested Motor Speech Errors: Not applicable Interfering Components: Premorbid status             Functional Assessment Tool Used: NOMS Functional Limitations: Swallowing Swallow Current Status ([id number]): At least 80 percent but less than 100 percent impaired, limited or restricted Swallow Goal Status [phone]([id number]): At least 60 percent but less than 80 percent impaired, limited or restricted         ADAMS,PAT, M.S., CCC-SLP 08/15/2016, 4:20 PM

## 2016-08-15 NOTE — Progress Notes (Signed)
STROKE TEAM PROGRESS NOTE   HISTORY OF PRESENT ILLNESS (per record) Craig [name] is an 83 y.o. male who presented for evaluation of confusion progressing over the past 3 days. He also has exhibited generalized weakness with poor balance. He has not been able to walk and family feels that this is due to leg weakness. He has had difficulty swallowing and has exhibited choking when eating. Recent symptoms include a dry cough and he has had right lower chest pain in conjunction with the cough. No fever or chills were endorsed on initial presentation. Of note, he was hypothermic in the ED.   His PMHx includes HTN, HLD, hypothyroidism, gout, asthma, pacemaker, CAD, PTSD, CKD3, CHF, atrial fibrillation, shingles and BPH. He is on Eliquis for his atrial fibrillation.   Patient was not administered IV t-PA secondary to being on Eliquis. He was admitted for further evaluation and treatment.   SUBJECTIVE (INTERVAL HISTORY) His family is at the bedside.  Overall he feels his condition is stable.    OBJECTIVE Temp:  [95.8 F (35.4 C)] 95.8 F (35.4 C) (01/10 2143) Pulse Rate:  [58-86] 61 (01/11 1157) Cardiac Rhythm: Ventricular paced;Atrial fibrillation (01/11 1143) Resp:  [14-33] 16 (01/11 1157) BP: (101-141)/(52-91) 122/73 (01/11 1157) SpO2:  [94 %-100 %] 98 % (01/11 1157) Weight:  [78.9 kg (174 lb)] 78.9 kg (174 lb) (01/10 2009)  CBC:  Recent Labs Lab 08/14/16 2102 08/14/16 2116  WBC 7.1  --   NEUTROABS 4.9  --   HGB 14.3 15.3  HCT 40.1 45.0  MCV 82.9  --   PLT 195  --     Basic Metabolic Panel:  Recent Labs Lab 08/14/16 2102 08/14/16 2116  NA 136 138  K 3.9 3.9  CL 103 103  CO2 23  --   GLUCOSE 112* 107*  BUN 19 23*  CREATININE 1.41* 1.30*  CALCIUM 9.8  --     Lipid Panel:    Component Value Date/Time   CHOL 138 08/15/2016 0447   TRIG 39 08/15/2016 0447   HDL 52 08/15/2016 0447   CHOLHDL 2.7 08/15/2016 0447   VLDL 8 08/15/2016 0447   LDLCALC 78 08/15/2016 0447    HgbA1c:  Lab Results  Component Value Date   HGBA1C 6.5 (H) 08/29/2011   Urine Drug Screen: No results found for: LABOPIA, COCAINSCRNUR, LABBENZ, AMPHETMU, THCU, LABBARB    IMAGING  Ct Head Wo Contrast  Result Date: 08/14/2016 CLINICAL DATA:  Stroke-like symptoms beginning 3 days ago. Lethargy and speech disturbance. History of dementia. EXAM: CT HEAD WITHOUT CONTRAST TECHNIQUE: Contiguous axial images were obtained from the base of the skull through the vertex without intravenous contrast. COMPARISON:  02/13/2015 FINDINGS: Brain: There is no evidence of acute cortical infarct, intracranial hemorrhage, mass, midline shift, or extra-axial fluid collection. Moderate cerebral atrophy is unchanged. Periventricular white matter hypodensities are unchanged and nonspecific but compatible with chronic small vessel ischemic disease, mild for age. Vascular: Calcified atherosclerosis at the skullbase. No hyperdense vessel. Skull: No fracture or suspicious osseous lesion. Sinuses/Orbits: Partially visualized chronic right maxillary sinusitis. Clear mastoid air cells. No acute abnormality in the imaged portions of the orbits. Other: Scattered tiny locules of gas in the cavernous sinuses and infratemporal fossae, possibly related to recent venipuncture. IMPRESSION: 1. No evidence of acute intracranial abnormality. 2. Unchanged cerebral atrophy and chronic small vessel ischemia. Electronically Signed   By: Sebastian Ache M.D.   On: 08/14/2016 20:35   Dg Chest Port 1 View  Result Date:  08/14/2016 CLINICAL DATA:  Altered mental status EXAM: PORTABLE CHEST 1 VIEW COMPARISON:  06/19/2015 FINDINGS: Left-sided single lead pacing device with lead over the right ventricle. Mild left greater than right bibasilar opacities, atelectasis versus mild infiltrates. Stable cardiomegaly with atherosclerosis. No pneumothorax. IMPRESSION: Mild left greater than right bibasilar opacities suspicious for infiltrates. Stable mild  cardiomegaly Electronically Signed   By: Jasmine Pang M.D.   On: 08/14/2016 21:17   Carotid Doppler   Bilateral:  1-39% ICA stenosis.  Vertebral artery flow is antegrade on the right.The left vertebral artery could no be visualized due to movement.   2D Echocardiogram  - Left ventricle: The cavity size was normal. Wall thickness was increased in a pattern of mild LVH. Systolic function was moderately reduced. The estimated ejection fraction was in the range of 35% to 40%. Akinesis of the lateral and inferolateral myocardium. - Mitral valve: There was moderate to severe regurgitation. - Left atrium: The atrium was moderately to severely dilated. - Right atrium: The atrium was dilated. - Pulmonary arteries: Systolic pressure was severely increased. PA peak pressure: 89 mm Hg (S).   PHYSICAL EXAM Elderly African American male. Not in distress.restless and uncooperative with exam. . Afebrile. Head is nontraumatic. Neck is supple without bruit.    Cardiac exam no murmur or gallop. Lungs are clear to auscultation. Distal pulses are well felt. Neurological Exam : Mental Status: Awake.  Of he. Minimal speech output. Poor eye contact.  Unable to tell me his age and why he is in the hospital.Has difficulty following commands requiring frequent repetition and tactile stimulation to initiate motor responses.  Cranial Nerves: II: Blinks to threat in right and left visual fields. PERRL.   III,IV, VI: Gazes conjugately to left and right with saccadic pursuits noted.  V,VII: No definite facial droop. Reacts to facial light touch bilaterally VIII: hearing intact to questions and commands.  IX,X: palate rises symmetrically XI: No definite asymmetry XII: midline tongue extension Motor: RUE and RLE with decreased excursion and slight delay in initiation of movements relative to left. LUE and LLE 4+/5.  Sensory: Reacts to FT x 4.  Deep Tendon Reflexes: Hyperactive x 4 without definite asymmetry.  Plantars:  Equivocal bilaterally.  Cerebellar: Slow FNF bilaterally. No definite ataxia.  Gait: Deferred.   ASSESSMENT/PLAN Craig [name] is a 83 y.o. male with history of HTN, HLD, hypothyroidism, gout, asthma, pacemaker, CAD, PTSD, CKD3, CHF, atrial fibrillation, shingles and BPH presenting with 3 days of confusion with generalized weakness and poor balance, chest pain and cough. He did not receive IV t-PA due to non-focal neuro presentation.   Acute encephalopathy. Generalized weakness, doubt Stroke  Ct no acute abnormalities  MRI  / MRA  pacer  Carotid Doppler  No significant ICA stenosis  2D Echo  EF 35-40% stenosis  Repeat CT in am to confirm/refute stroke  LDL 78  HgbA1c pending  Heparin 5000 units sq tid for VTE prophylaxis  Diet NPO time specified  Eliquis (apixaban) daily prior to admission, now on aspirin 300 mg suppository daily  Patient counseled to be compliant with his antithrombotic medications  Ongoing aggressive stroke risk factor management  Therapy recommendations:  pending   Disposition:  pending   Atrial Fibrillation  Home anticoagulation:  Eliquis (apixaban) daily   Recommend resumption eliquis once able to swallow   Hypertension  Stable  Permissive hypertension (OK if < 220/120) but gradually normalize in 5-7 days  Long-term BP goal normotensive  Hyperlipidemia  Home meds:  No statin  LDL 78, goal < 70 if stroke dx  Add statin if stroke dx  Other Stroke Risk Factors  Advanced age  Coronary artery disease, elevated trop  Chronic systolic CHF  Other Active Problems  Possible aspiration PNA on zosyn  GERD  Hospital day # 0  Rhoderick MoodyBIBY,SHARON  [facility] See Amion for Pager information 08/15/2016 6:07 PM  I have personally examined this patient, reviewed notes, independently viewed imaging studies, participated in medical decision making and plan of care.ROS completed by me personally and pertinent positives fully  documented  I have made any additions or clarifications directly to the above note. Agree with note above. Her he has presented with altered mental status and generalized without clear focal new deficits possibly encephalopathy do to infection. Doubt stroke  Greater than 50% time during this 25 minute visit was spent on counseling and coordination of care about stroke, risk, evaluation plan and answering questions  Delia HeadyPramod Ademola Vert, MD Medical Director [facility] Pager: [phone] 08/15/2016 8:01 PM  To contact Stroke Continuity provider, please refer to [email or web address]. After hours, contact General Neurology

## 2016-08-15 NOTE — Progress Notes (Signed)
PT Cancellation Note  Patient Details Name: Craig PhenesWilbur R Wickizer MRN: [id number] DOB: 05/26/1919   Cancelled Treatment:    Reason Eval/Treat Not Completed: Medical issues which prohibited therapy (upward trending troponins, Low temp).  Will check later as time and pt allow.   Ivar DrapeStout, Keirston Saephanh E 08/15/2016, 1:26 PM   Samul Dadauth Kalley Nicholl, PT MS Acute Rehab Dept. Number: [facility] [phone] and [facility] [phone]

## 2016-08-16 ENCOUNTER — Inpatient Hospital Stay (HOSPITAL_COMMUNITY): Payer: Medicare Other

## 2016-08-16 DIAGNOSIS — R748 Abnormal levels of other serum enzymes: Secondary | ICD-10-CM

## 2016-08-16 DIAGNOSIS — I480 Paroxysmal atrial fibrillation: Secondary | ICD-10-CM

## 2016-08-16 LAB — CBC WITH DIFFERENTIAL/PLATELET
BASOS ABS: 0 10*3/uL (ref 0.0–0.1)
Basophils Relative: 0 %
Eosinophils Absolute: 0.3 10*3/uL (ref 0.0–0.7)
Eosinophils Relative: 3 %
HEMATOCRIT: 41.5 % (ref 39.0–52.0)
HEMOGLOBIN: 14.4 g/dL (ref 13.0–17.0)
LYMPHS ABS: 1.5 10*3/uL (ref 0.7–4.0)
LYMPHS PCT: 17 %
MCH: 28.7 pg (ref 26.0–34.0)
MCHC: 34.7 g/dL (ref 30.0–36.0)
MCV: 82.8 fL (ref 78.0–100.0)
Monocytes Absolute: 1 10*3/uL (ref 0.1–1.0)
Monocytes Relative: 11 %
NEUTROS ABS: 6.2 10*3/uL (ref 1.7–7.7)
NEUTROS PCT: 69 %
PLATELETS: 178 10*3/uL (ref 150–400)
RBC: 5.01 MIL/uL (ref 4.22–5.81)
RDW: 15.8 % — ABNORMAL HIGH (ref 11.5–15.5)
WBC: 9 10*3/uL (ref 4.0–10.5)

## 2016-08-16 LAB — BASIC METABOLIC PANEL
ANION GAP: 15 (ref 5–15)
BUN: 21 mg/dL — ABNORMAL HIGH (ref 6–20)
CHLORIDE: 105 mmol/L (ref 101–111)
CO2: 19 mmol/L — ABNORMAL LOW (ref 22–32)
Calcium: 9.6 mg/dL (ref 8.9–10.3)
Creatinine, Ser: 1.61 mg/dL — ABNORMAL HIGH (ref 0.61–1.24)
GFR calc Af Amer: 40 mL/min — ABNORMAL LOW (ref >60)
GFR calc non Af Amer: 35 mL/min — ABNORMAL LOW (ref >60)
Glucose, Bld: 122 mg/dL — ABNORMAL HIGH (ref 65–99)
Potassium: 3.9 mmol/L (ref 3.5–5.1)
SODIUM: 139 mmol/L (ref 135–145)

## 2016-08-16 LAB — HEMOGLOBIN A1C
Hgb A1c MFr Bld: 6.2 % — ABNORMAL HIGH (ref 4.8–5.6)
MEAN PLASMA GLUCOSE: 131 mg/dL

## 2016-08-16 MED ORDER — FUROSEMIDE 10 MG/ML IJ SOLN
40.0000 mg | Freq: Once | INTRAMUSCULAR | Status: AC
  Administered 2016-08-16: 40 mg via INTRAVENOUS
  Filled 2016-08-16: qty 4

## 2016-08-16 MED ORDER — FUROSEMIDE 10 MG/ML IJ SOLN
40.0000 mg | Freq: Two times a day (BID) | INTRAMUSCULAR | Status: DC
  Administered 2016-08-16 – 2016-08-18 (×4): 40 mg via INTRAVENOUS
  Filled 2016-08-16 (×4): qty 4

## 2016-08-16 NOTE — Progress Notes (Signed)
Pt lethargic during this shift. Does not follow commands. Temp noted low this afternoon. Md notified

## 2016-08-16 NOTE — Evaluation (Signed)
Occupational Therapy Evaluation Patient Details Name: Craig [name] MRN: [id number] DOB: Jun 03, 1919 Today's Date: 08/16/2016    History of Present Illness Craig [name] is a 83 y.o. male with medical history significant of HTN, hyperlipidemia, asthma, hypothyroidism, gout, pacemaker placement, mitral valve insufficiency, CAD, PTSD, CKD-stage III, systolic CHF, A-fib, BPH, shingles, who presents with AMS, poor balance and weakness, cough.   Clinical Impression   This 83 yo male admitted with above presents to acute OT with deficits below (see OT problem list) thus affecting his PLOF per dtr of S for all mobility and basic ADLs. He will benefit from trial of acute OT to see if progress can be made.     Follow Up Recommendations  SNF;Supervision/Assistance - 24 hour    Equipment Recommendations  Other (comment) (TBD at next venue)    Recommendations for Other Services Other (comment) (pallative consult)     Precautions / Restrictions Precautions Precautions: Fall Restrictions Weight Bearing Restrictions: No      Mobility Bed Mobility Overal bed mobility: Needs Assistance Bed Mobility: Supine to Sit;Sit to Supine     Supine to sit: Total assist;+2 for physical assistance Sit to supine: Total assist;+2 for physical assistance         Balance Overall balance assessment: Needs assistance Sitting-balance support: No upper extremity supported;Feet supported   Sitting balance - Comments: varied from zero (pushing ) to fair (minguard A)       Standing balance comment: Not attempted this session--not safe to do so                            ADL Overall ADL's : Needs assistance/impaired                                       General ADL Comments: total A for all basic ADLs at this time from any position     Vision Additional Comments: Very limited eye opening and then did not seem to attend to anything or anyone           Pertinent Vitals/Pain Pain Assessment: Faces Faces Pain Scale: No hurt     Hand Dominance Right (per chart)   Extremity/Trunk Assessment Upper Extremity Assessment Upper Extremity Assessment: RUE deficits/detail;LUE deficits/detail RUE Deficits / Details: not following commands to check AROM of Bil UEs; PROM WNL RUE Coordination: decreased fine motor;decreased gross motor LUE Deficits / Details: not following commands to check AROM of Bil UEs; PROM WNL LUE Coordination: decreased fine motor;decreased gross motor           Communication Communication Communication: Receptive difficulties;Expressive difficulties   Cognition Arousal/Alertness: Awake/alert   Overall Cognitive Status: Impaired/Different from baseline Area of Impairment: Following commands;Safety/judgement;Problem solving       Following Commands:  (does not follow one-step commands) Safety/Judgement: Decreased awareness of safety;Decreased awareness of deficits   Problem Solving: Slow processing;Decreased initiation;Difficulty sequencing;Requires verbal cues;Requires tactile cues General Comments: Per dtr that he lives with he could carry on a conversation pta              Home Living Family/patient expects to be discharged to:: Skilled nursing facility   Available Help at Discharge: Family;Available 24 hours/day Type of Home: House  Lives With: Daughter    Prior Functioning/Environment Level of Independence: Needs assistance  Gait / Transfers Assistance Needed: S with dtr in house ADL's / Homemaking Assistance Needed: S with dtr in house            OT Problem List: Decreased strength;Decreased activity tolerance;Impaired balance (sitting and/or standing);Impaired vision/perception;Decreased coordination;Decreased cognition;Decreased safety awareness;Decreased knowledge of use of DME or AE;Impaired UE functional use   OT Treatment/Interventions:  Self-care/ADL training;Balance training;Therapeutic activities;Cognitive remediation/compensation;Visual/perceptual remediation/compensation;DME and/or AE instruction;Patient/family education    OT Goals(Current goals can be found in the care plan section) Acute Rehab OT Goals Patient Stated Goal: unable  OT Goal Formulation: Patient unable to participate in goal setting Time For Goal Achievement: 08/30/16 Potential to Achieve Goals: Fair  OT Frequency: Min 2X/week   Barriers to D/C:    needing alot more physical A at this time (from S to now total A for all basic ADLs and mobility)       Co-evaluation PT/OT/SLP Co-Evaluation/Treatment: Yes Reason for Co-Treatment: For patient/therapist safety   OT goals addressed during session: Strengthening/ROM      End of Session Nurse Communication:  (applied warm blankets to pt due to when NT took temp it was low)  Activity Tolerance: Patient limited by fatigue Patient left: in bed;with call bell/phone within reach;with nursing/sitter in room;with family/visitor present   Time: [id number] OT Time Calculation (min): 28 min Charges:  OT General Charges $OT Visit: 1 Procedure OT Evaluation $OT Eval Moderate Complexity: 1 Procedure  Craig GeorgesLeonard, Aijah Lattner Eva  [phone] 08/16/2016, 2:53 PM

## 2016-08-16 NOTE — Evaluation (Signed)
Physical Therapy Evaluation Patient Details Name: Craig [name] MRN: [id number] DOB: 05-01-1919 Today's Date: 08/16/2016   History of Present Illness  Craig [name] is a 83 y.o. male with medical history significant of HTN, hyperlipidemia, asthma, hypothyroidism, gout, pacemaker placement, mitral valve insufficiency, CAD, PTSD, CKD-stage III, systolic CHF, A-fib, BPH, shingles, who presents with AMS, poor balance and weakness, cough.  Clinical Impression  Patient presents with decreased mobility and awareness during this session.  Feel he may benefit from skilled PT in the acute setting to decrease burden of care at next venue.  Patient also likely to benefit from palliative care consult during acute stay.    Follow Up Recommendations SNF;Supervision/Assistance - 24 hour    Equipment Recommendations  None recommended by PT (if home hoyer lift and hosptial bed)    Recommendations for Other Services       Precautions / Restrictions Precautions Precautions: Fall Restrictions Weight Bearing Restrictions: No      Mobility  Bed Mobility Overal bed mobility: Needs Assistance Bed Mobility: Supine to Sit;Sit to Supine     Supine to sit: Total assist;+2 for physical assistance Sit to supine: Total assist;+2 for physical assistance      Transfers                 General transfer comment: NT due to weakness, not following commands  Ambulation/Gait                Stairs            Wheelchair Mobility    Modified Rankin (Stroke Patients Only) Modified Rankin (Stroke Patients Only) Pre-Morbid Rankin Score: Moderate disability Modified Rankin: Severe disability     Balance Overall balance assessment: Needs assistance Sitting-balance support: No upper extremity supported;Feet supported   Sitting balance - Comments: varied from zero (pushing ) to fair (minguard A); at times spontaneously initiating leaning forward, most of the time leans back Postural  control: Posterior lean     Standing balance comment: Not attempted this session--not safe to do so                             Pertinent Vitals/Pain Pain Assessment: Faces Faces Pain Scale: No hurt    Home Living Family/patient expects to be discharged to:: Skilled nursing facility   Available Help at Discharge: Family;Available 24 hours/day Type of Home: House                Prior Function Level of Independence: Needs assistance   Gait / Transfers Assistance Needed: S with dtr in house  ADL's / Homemaking Assistance Needed: S with dtr in house        Hand Dominance   Dominant Hand: Right    Extremity/Trunk Assessment   Upper Extremity Assessment Upper Extremity Assessment: Defer to OT evaluation RUE Deficits / Details: not following commands to check AROM of Bil UEs; PROM WNL RUE Coordination: decreased fine motor;decreased gross motor LUE Deficits / Details: not following commands to check AROM of Bil UEs; PROM WNL LUE Coordination: decreased fine motor;decreased gross motor    Lower Extremity Assessment Lower Extremity Assessment: Difficult to assess due to impaired cognition;Generalized weakness    Cervical / Trunk Assessment Cervical / Trunk Assessment: Normal  Communication   Communication: Receptive difficulties;Expressive difficulties  Cognition Arousal/Alertness: Lethargic Behavior During Therapy: Flat affect Overall Cognitive Status: Impaired/Different from baseline Area of Impairment: Following commands;Safety/judgement;Problem solving  Following Commands:  (does not follow commands) Safety/Judgement: Decreased awareness of safety;Decreased awareness of deficits   Problem Solving: Slow processing;Decreased initiation;Difficulty sequencing;Requires verbal cues;Requires tactile cues General Comments: Per dtr that he lives with he could carry on a conversation pta    General Comments General comments (skin integrity, edema,  etc.): daughter in room and reports pt was more alert yesterday and able to say his name    Exercises     Assessment/Plan    PT Assessment Patient needs continued PT services  PT Problem List Decreased balance;Decreased activity tolerance;Decreased strength;Decreased mobility;Decreased cognition;Decreased knowledge of use of DME          PT Treatment Interventions      PT Goals (Current goals can be found in the Care Plan section)  Acute Rehab PT Goals Patient Stated Goal: unable  PT Goal Formulation: Patient unable to participate in goal setting Time For Goal Achievement: 08/30/16 Potential to Achieve Goals: Poor    Frequency Min 3X/week   Barriers to discharge        Co-evaluation PT/OT/SLP Co-Evaluation/Treatment: Yes Reason for Co-Treatment: Complexity of the patient's impairments (multi-system involvement);For patient/therapist safety PT goals addressed during session: Mobility/safety with mobility;Balance OT goals addressed during session: Strengthening/ROM       End of Session Equipment Utilized During Treatment: Oxygen Activity Tolerance: Patient limited by lethargy Patient left: in bed;with call bell/phone within reach;with family/visitor present;with nursing/sitter in room           Time: 1338-1406 PT Time Calculation (min) (ACUTE ONLY): 28 min   Charges:   PT Evaluation $PT Eval High Complexity: 1 Procedure     PT G CodesElray [name] August 23, 2016, 4:44 PM  Sheran Lawless, PT [phone] 08/23/16

## 2016-08-16 NOTE — Progress Notes (Signed)
Speech Language Pathology Treatment: Dysphagia  Patient Details Name: Craig [name] MRN: [id number] DOB: [birth year] Today's Date: 08/16/2016 Time: [id number] SLP Time Calculation (min) (ACUTE ONLY): 10 min  Assessment / Plan / Recommendation Clinical Impression   Patient displays fluctuating alertness and required mod-max cues to follow basic commands. He demonstrated brief laryngeal movement in response to clinician palpation; does not appear to be complete swallow. In response to verbal cues and tactile stimulation of ice chip to lip, patient moves lips and makes minimal effort to retrieve from spoon. Oral manipulation is absent following presentation of ice chip to oral cavity despite max cues. SLP removed bolus. Recommend continuing NPO status, with oral care 4x per day. Patient may benefit from temporary alternate means of nutrition/hydration. SLP will follow up for PO readiness.    HPI HPI: 83 y.o. male with medical history significant of hypertension, hyperlipidemia, asthma, hypothyroidism, gout, pacemaker placement, mitral valve insufficiency, CAD, PTSD, chronic kidney disease-stage III, systolic congestive heart failure with EF for 45-50 percent, atrial fibrillation on Eliquis, BPH, shingles, who presents with AMS, poor balance and weakness, cough      SLP Plan  Continue with current plan of care     Recommendations  Diet recommendations: NPO Medication Administration: Via alternative means                Oral Care Recommendations: Oral care QID Follow up Recommendations: Other (comment) (TBD) Plan: Continue with current plan of care       GO             Craig BatonMary Beth Javante Nilsson, MS CF-SLP Speech-Language Pathologist    [name] 08/16/2016, 10:33 AM

## 2016-08-16 NOTE — Progress Notes (Signed)
STROKE TEAM PROGRESS NOTE   HISTORY OF PRESENT ILLNESS (per record) Craig [name] is an 83 y.o. male who presented for evaluation of confusion progressing over the past 3 days. He also has exhibited generalized weakness with poor balance. He has not been able to walk and family feels that this is due to leg weakness. He has had difficulty swallowing and has exhibited choking when eating. Recent symptoms include a dry cough and he has had right lower chest pain in conjunction with the cough. No fever or chills were endorsed on initial presentation. Of note, he was hypothermic in the ED.   His PMHx includes HTN, HLD, hypothyroidism, gout, asthma, pacemaker, CAD, PTSD, CKD3, CHF, atrial fibrillation, shingles and BPH. He is on Eliquis for his atrial fibrillation.   Patient was not administered IV t-PA secondary to being on Eliquis. He was admitted for further evaluation and treatment.   SUBJECTIVE (INTERVAL HISTORY) His family is not at the bedside.   Repeat CT head also shows no acute  abnormality  OBJECTIVE Temp:  [98.5 F (36.9 C)-99.1 F (37.3 C)] 98.5 F (36.9 C) (01/12 1610) Pulse Rate:  [61-71] 66 (01/12 1004) Cardiac Rhythm: Ventricular paced;Bundle branch block (01/12 0700) Resp:  [16-22] 20 (01/12 1004) BP: (120-154)/(73-94) 130/94 (01/12 1004) SpO2:  [98 %-100 %] 99 % (01/12 1004)  CBC:   Recent Labs Lab 08/14/16 2102 08/14/16 2116 08/16/16 0354  WBC 7.1  --  9.0  NEUTROABS 4.9  --  6.2  HGB 14.3 15.3 14.4  HCT 40.1 45.0 41.5  MCV 82.9  --  82.8  PLT 195  --  178    Basic Metabolic Panel:   Recent Labs Lab 08/14/16 2102 08/14/16 2116 08/16/16 0354  NA 136 138 139  K 3.9 3.9 3.9  CL 103 103 105  CO2 23  --  19*  GLUCOSE 112* 107* 122*  BUN 19 23* 21*  CREATININE 1.41* 1.30* 1.61*  CALCIUM 9.8  --  9.6    Lipid Panel:     Component Value Date/Time   CHOL 138 08/15/2016 0447   TRIG 39 08/15/2016 0447   HDL 52 08/15/2016 0447   CHOLHDL 2.7  08/15/2016 0447   VLDL 8 08/15/2016 0447   LDLCALC 78 08/15/2016 0447   HgbA1c:  Lab Results  Component Value Date   HGBA1C 6.2 (H) 08/15/2016   Urine Drug Screen: No results found for: LABOPIA, COCAINSCRNUR, LABBENZ, AMPHETMU, THCU, LABBARB    IMAGING  Ct Head Wo Contrast  Result Date: 08/16/2016 CLINICAL DATA:  Stroke with sleepiness this morning. EXAM: CT HEAD WITHOUT CONTRAST TECHNIQUE: Contiguous axial images were obtained from the base of the skull through the vertex without intravenous contrast. COMPARISON:  08/14/2016 FINDINGS: Brain: No evidence of acute infarction, hemorrhage, hydrocephalus, extra-axial collection or mass lesion/mass effect. Atrophy, especially prominent in the mesial temporal lobes. In the setting of dementia, this pattern is seen with Alzheimer's disease. Chronic microvascular disease with ischemic gliosis symmetric in the bilateral cerebral white matter. Vascular: No hyperdense vessel.  Atherosclerotic calcification. Skull: No acute or aggressive finding. Sinuses/Orbits: Bilateral cataract resection.  No acute finding. IMPRESSION: 1. No acute finding or change from 2 days prior. 2. Atrophy as described above.  Chronic microvascular disease. Electronically Signed   By: Marnee Spring M.D.   On: 08/16/2016 08:36   Ct Head Wo Contrast  Result Date: 08/14/2016 CLINICAL DATA:  Stroke-like symptoms beginning 3 days ago. Lethargy and speech disturbance. History of dementia. EXAM: CT HEAD  WITHOUT CONTRAST TECHNIQUE: Contiguous axial images were obtained from the base of the skull through the vertex without intravenous contrast. COMPARISON:  02/13/2015 FINDINGS: Brain: There is no evidence of acute cortical infarct, intracranial hemorrhage, mass, midline shift, or extra-axial fluid collection. Moderate cerebral atrophy is unchanged. Periventricular white matter hypodensities are unchanged and nonspecific but compatible with chronic small vessel ischemic disease, mild for  age. Vascular: Calcified atherosclerosis at the skullbase. No hyperdense vessel. Skull: No fracture or suspicious osseous lesion. Sinuses/Orbits: Partially visualized chronic right maxillary sinusitis. Clear mastoid air cells. No acute abnormality in the imaged portions of the orbits. Other: Scattered tiny locules of gas in the cavernous sinuses and infratemporal fossae, possibly related to recent venipuncture. IMPRESSION: 1. No evidence of acute intracranial abnormality. 2. Unchanged cerebral atrophy and chronic small vessel ischemia. Electronically Signed   By: Sebastian AcheAllen  Grady M.D.   On: 08/14/2016 20:35   Dg Chest Port 1 View  Result Date: 08/16/2016 CLINICAL DATA:  Aspiration pneumonia. EXAM: PORTABLE CHEST 1 VIEW COMPARISON:  08/14/2016. FINDINGS: Cardiac pacer with lead tip projected right ventricle. Cardiomegaly with pulmonary vascular prominence and mild bilateral interstitial prominence. Small bilateral effusions. Findings consistent with CHF. IMPRESSION: 1. Cardiac pacer with lead tip over the right ventricle. 2. Congestive heart failure with mild bilateral pulmonary interstitial edema. Pneumonitis cannot be excluded. Small bilateral pleural effusions. These findings have increased slightly from prior exam. Electronically Signed   By: Maisie Fushomas  Register   On: 08/16/2016 07:45   Dg Chest Port 1 View  Result Date: 08/14/2016 CLINICAL DATA:  Altered mental status EXAM: PORTABLE CHEST 1 VIEW COMPARISON:  06/19/2015 FINDINGS: Left-sided single lead pacing device with lead over the right ventricle. Mild left greater than right bibasilar opacities, atelectasis versus mild infiltrates. Stable cardiomegaly with atherosclerosis. No pneumothorax. IMPRESSION: Mild left greater than right bibasilar opacities suspicious for infiltrates. Stable mild cardiomegaly Electronically Signed   By: Jasmine PangKim  Fujinaga M.D.   On: 08/14/2016 21:17   Carotid Doppler   Bilateral:  1-39% ICA stenosis.  Vertebral artery flow is  antegrade on the right.The left vertebral artery could no be visualized due to movement.   2D Echocardiogram  - Left ventricle: The cavity size was normal. Wall thickness was increased in a pattern of mild LVH. Systolic function was moderately reduced. The estimated ejection fraction was in the range of 35% to 40%. Akinesis of the lateral and inferolateral myocardium. - Mitral valve: There was moderate to severe regurgitation. - Left atrium: The atrium was moderately to severely dilated. - Right atrium: The atrium was dilated. - Pulmonary arteries: Systolic pressure was severely increased. PA peak pressure: 89 mm Hg (S).   PHYSICAL EXAM Elderly African American male. Not in distress.restless and uncooperative with exam. . Afebrile. Head is nontraumatic. Neck is supple without bruit.    Cardiac exam no murmur or gallop. Lungs are clear to auscultation. Distal pulses are well felt. Neurological Exam : Mental Status: Awake.    Minimal speech output. Poor eye contact.  Unable to tell me his age and why he is in the hospital.Has difficulty following commands requiring frequent repetition and tactile stimulation to initiate motor responses.  Cranial Nerves: II: Blinks to threat in right and left visual fields. PERRL.   III,IV, VI: Gazes conjugately to left and right with saccadic pursuits noted.  V,VII: No definite facial droop. Reacts to facial light touch bilaterally VIII: hearing intact to questions and commands.  IX,X: palate rises symmetrically XI: No definite asymmetry XII: midline tongue extension  Motor: RUE and RLE with decreased excursion and slight delay in initiation of movements relative to left. LUE and LLE 4+/5.  Sensory: Reacts to FT x 4.  Deep Tendon Reflexes: Hyperactive x 4 without definite asymmetry.  Plantars: Equivocal bilaterally.  Cerebellar: Slow FNF bilaterally. No definite ataxia.  Gait: Deferred.   ASSESSMENT/PLAN Mr. UPTON RUSSEY is a 83 y.o. male with history  of HTN, HLD, hypothyroidism, gout, asthma, pacemaker, CAD, PTSD, CKD3, CHF, atrial fibrillation, shingles and BPH presenting with 3 days of confusion with generalized weakness and poor balance, chest pain and cough. He did not receive IV t-PA due to non-focal neuro presentation.   Acute encephalopathy. Generalized weakness, doubt Stroke  CT head x 2  no acute abnormalities  MRI  / MRA  pacer  Carotid Doppler  No significant ICA stenosis  2D Echo  EF 35-40% stenosis  Repeat CT in am to confirm/refute stroke  LDL 78  HgbA1c pending  Heparin 5000 units sq tid for VTE prophylaxis Diet NPO time specified  Eliquis (apixaban) daily prior to admission, now on aspirin 300 mg suppository daily  Patient counseled to be compliant with his antithrombotic medications  Ongoing aggressive stroke risk factor management  Therapy recommendations:  pending   Disposition:  pending   Atrial Fibrillation  Home anticoagulation:  Eliquis (apixaban) daily   Recommend resumption eliquis once able to swallow   Hypertension  Stable  Permissive hypertension (OK if < 220/120) but gradually normalize in 5-7 days  Long-term BP goal normotensive  Hyperlipidemia  Home meds:  No statin  LDL 78, goal < 70 if stroke dx  Add statin if stroke dx  Other Stroke Risk Factors  Advanced age  Coronary artery disease, elevated trop  Chronic systolic CHF  Other Active Problems  Possible aspiration PNA on zosyn  GERD  Hospital day # 1  I have personally examined this patient, reviewed notes, independently viewed imaging studies, participated in medical decision making and plan of care.ROS completed by me personally and pertinent positives fully documented  I have made any additions or clarifications directly to the above note. Agree with note above. Stroke team will sign off. Kindly call for questions.  Delia Heady, MD Medical Director [facility] Pager: [phone] 08/16/2016  2:31 PM    To contact Stroke Continuity provider, please refer to [email or web address]. After hours, contact General Neurology

## 2016-08-16 NOTE — Progress Notes (Addendum)
PROGRESS NOTE [facility]   Camelia PhenesWilbur R Heinbaugh   UJW:[id number]:[id number] DOB: 1919/03/05  DOA: 08/14/2016 PCP: Lolita PatellaEADE,ROBERT ALEXANDER, MD   Brief Narrative:  83 year old male with past medical history of hypertension, hypothyroidism, CAD, C KD stage III, systolic CHF, AV block status post PPM presented today the after being found by his daughter with AMS and generalized weakness. In the ED CT head negative for acute intracranial abnormalities but MRI could not be done because of pacemaker. Chest x-ray show bilateral basilar opacity. Patient admitted for stroke workup and antibiotics for possible aspiration pneumonia.  I discussed patient with daughter, she reported that patient has been weak for about a week or so with decrease on oral intake. Patient was having difficulty swallowing and one day prior to admission he could not walk at all.  Subjective: Patient not responsive today, very lethargic.   Assessment & Plan: Acute encephalopathy and possible stroke - doubt stroke giving generalized weakness - initial CT with no acute abnormalities -Repeated CT negative for stroke  -No MRI due to pacemaker -AMS could be secondary to patient pneumonia noted on checks x-ray - daughter report difficulty swallowing and eating the previous week -ASA 325 -Neurology recommendations appreciated -Swallow eval pending  Aspiration pneumonia - difficulty swallowing  -Continue IV antibiotic Zosyn -Follow-up cultures - still pending   Chronic systolic CHF - CXR show signs of fluid overload  Will start IV lasix 40 mg BID  Other meds on hold given patient obtunded  Elevated trop and hx of CAD Likely demand ischemia Radiology recommendations appreciated - given advanced patient functional status no further workup  Hypertension - stable Monitor BP  Atrial Flutter/Fib - heart rate well controlled, on home Eliquis, CHADVASC 4 Given currently nothing by mouth Eliquis is on hold IV heparin indicated at this  time Continue to monitor in telemetry  CKD (chronic kidney disease), stage III: - Improved with IV fluid Cr worsening - likely cardiorenal syndrome   Case discussed with daughter by phone, patient showing signs of multiorgan failure, explained that prognosis is poor. Given patients comorbidities, inability to eat chances to recover are low. Will continue to treat medically, and monitor daily.   DVT prophylaxis: Sq Heparin  Code Status: DNR Family Communication: Discussed with daughter via phone Disposition Plan: Home vs SNF when medically clear   Consultants:   Neurology   Cardiology   Procedures:   ECHO   Carotid Doppler   Antimicrobials:  Zosyn 08/14/16   Objective: Vitals:   08/16/16 1004 08/16/16 1342 08/16/16 1415 08/16/16 1649  BP: (!) 130/94 124/73 128/69 (!) 144/98  Pulse: 66 (!) 57 60 64  Resp: 20 16  18   Temp: 97.8 F (36.6 C) (!) 92.5 F (33.6 C) (!) 94.5 F (34.7 C) (!) 94.9 F (34.9 C)  TempSrc: Axillary Axillary Rectal Rectal  SpO2: 99% 99% 99% 100%  Weight:      Height:        Intake/Output Summary (Last 24 hours) at 08/16/16 1715 Last data filed at 08/16/16 1500  Gross per 24 hour  Intake           798.34 ml  Output             1300 ml  Net          -501.66 ml   Filed Weights   08/14/16 2009  Weight: 78.9 kg (174 lb)    Examination:  General exam: Frail not responsive  Respiratory system: BS diminished, crackles throughout  Cardiovascular system:  S1S2 Irr Irr S4 4/6 SM No JVD, rubs or gallops Central nervous system: Unable to perform, patient lethargic and not following commands  Extremities: No pedal edema  Data Reviewed: I have personally reviewed following labs and imaging studies  CBC:  Recent Labs Lab 08/14/16 2102 08/14/16 2116 08/16/16 0354  WBC 7.1  --  9.0  NEUTROABS 4.9  --  6.2  HGB 14.3 15.3 14.4  HCT 40.1 45.0 41.5  MCV 82.9  --  82.8  PLT 195  --  178   Basic Metabolic Panel:  Recent Labs Lab  08/14/16 2102 08/14/16 2116 08/16/16 0354  NA 136 138 139  K 3.9 3.9 3.9  CL 103 103 105  CO2 23  --  19*  GLUCOSE 112* 107* 122*  BUN 19 23* 21*  CREATININE 1.41* 1.30* 1.61*  CALCIUM 9.8  --  9.6   GFR: Estimated Creatinine Clearance: 30.1 mL/min (by C-G formula based on SCr of 1.61 mg/dL (H)). Liver Function Tests:  Recent Labs Lab 08/14/16 2102  AST 27  ALT 22  ALKPHOS 113  BILITOT 0.6  PROT 7.3  ALBUMIN 3.8   No results for input(s): LIPASE, AMYLASE in the last 168 hours. No results for input(s): AMMONIA in the last 168 hours. Coagulation Profile:  Recent Labs Lab 08/14/16 2102  INR 1.23   Cardiac Enzymes:  Recent Labs Lab 08/14/16 0018 08/14/16 2106 08/15/16 0447 08/15/16 1141  CKTOTAL  --  77  --   --   TROPONINI 0.12*  --  0.12* 0.13*   BNP (last 3 results) No results for input(s): PROBNP in the last 8760 hours. HbA1C:  Recent Labs  08/15/16 0447  HGBA1C 6.2*   CBG:  Recent Labs Lab 08/14/16 2008  GLUCAP 110*   Lipid Profile:  Recent Labs  08/15/16 0447  CHOL 138  HDL 52  LDLCALC 78  TRIG 39  CHOLHDL 2.7   Thyroid Function Tests: No results for input(s): TSH, T4TOTAL, FREET4, T3FREE, THYROIDAB in the last 72 hours. Anemia Panel: No results for input(s): VITAMINB12, FOLATE, FERRITIN, TIBC, IRON, RETICCTPCT in the last 72 hours. Sepsis Labs: No results for input(s): PROCALCITON, LATICACIDVEN in the last 168 hours.  No results found for this or any previous visit (from the past 240 hour(s)).    Radiology Studies: Ct Head Wo Contrast  Result Date: 08/16/2016 CLINICAL DATA:  Stroke with sleepiness this morning. EXAM: CT HEAD WITHOUT CONTRAST TECHNIQUE: Contiguous axial images were obtained from the base of the skull through the vertex without intravenous contrast. COMPARISON:  08/14/2016 FINDINGS: Brain: No evidence of acute infarction, hemorrhage, hydrocephalus, extra-axial collection or mass lesion/mass effect. Atrophy,  especially prominent in the mesial temporal lobes. In the setting of dementia, this pattern is seen with Alzheimer's disease. Chronic microvascular disease with ischemic gliosis symmetric in the bilateral cerebral white matter. Vascular: No hyperdense vessel.  Atherosclerotic calcification. Skull: No acute or aggressive finding. Sinuses/Orbits: Bilateral cataract resection.  No acute finding. IMPRESSION: 1. No acute finding or change from 2 days prior. 2. Atrophy as described above.  Chronic microvascular disease. Electronically Signed   By: Marnee Spring M.D.   On: 08/16/2016 08:36   Ct Head Wo Contrast  Result Date: 08/14/2016 CLINICAL DATA:  Stroke-like symptoms beginning 3 days ago. Lethargy and speech disturbance. History of dementia. EXAM: CT HEAD WITHOUT CONTRAST TECHNIQUE: Contiguous axial images were obtained from the base of the skull through the vertex without intravenous contrast. COMPARISON:  02/13/2015 FINDINGS: Brain: There is no  evidence of acute cortical infarct, intracranial hemorrhage, mass, midline shift, or extra-axial fluid collection. Moderate cerebral atrophy is unchanged. Periventricular white matter hypodensities are unchanged and nonspecific but compatible with chronic small vessel ischemic disease, mild for age. Vascular: Calcified atherosclerosis at the skullbase. No hyperdense vessel. Skull: No fracture or suspicious osseous lesion. Sinuses/Orbits: Partially visualized chronic right maxillary sinusitis. Clear mastoid air cells. No acute abnormality in the imaged portions of the orbits. Other: Scattered tiny locules of gas in the cavernous sinuses and infratemporal fossae, possibly related to recent venipuncture. IMPRESSION: 1. No evidence of acute intracranial abnormality. 2. Unchanged cerebral atrophy and chronic small vessel ischemia. Electronically Signed   By: Sebastian Ache M.D.   On: 08/14/2016 20:35   Dg Chest Port 1 View  Result Date: 08/16/2016 CLINICAL DATA:   Aspiration pneumonia. EXAM: PORTABLE CHEST 1 VIEW COMPARISON:  08/14/2016. FINDINGS: Cardiac pacer with lead tip projected right ventricle. Cardiomegaly with pulmonary vascular prominence and mild bilateral interstitial prominence. Small bilateral effusions. Findings consistent with CHF. IMPRESSION: 1. Cardiac pacer with lead tip over the right ventricle. 2. Congestive heart failure with mild bilateral pulmonary interstitial edema. Pneumonitis cannot be excluded. Small bilateral pleural effusions. These findings have increased slightly from prior exam. Electronically Signed   By: Maisie Fus  Register   On: 08/16/2016 07:45   Dg Chest Port 1 View  Result Date: 08/14/2016 CLINICAL DATA:  Altered mental status EXAM: PORTABLE CHEST 1 VIEW COMPARISON:  06/19/2015 FINDINGS: Left-sided single lead pacing device with lead over the right ventricle. Mild left greater than right bibasilar opacities, atelectasis versus mild infiltrates. Stable cardiomegaly with atherosclerosis. No pneumothorax. IMPRESSION: Mild left greater than right bibasilar opacities suspicious for infiltrates. Stable mild cardiomegaly Electronically Signed   By: Jasmine Pang M.D.   On: 08/14/2016 21:17    Scheduled Meds: . aspirin  300 mg Rectal Daily   Or  . aspirin  325 mg Oral Daily  . famotidine (PEPCID) IV  20 mg Intravenous Q12H  . heparin  5,000 Units Subcutaneous Q8H  . levothyroxine  25 mcg Intravenous Daily  . piperacillin-tazobactam (ZOSYN)  IV  3.375 g Intravenous Q8H   Continuous Infusions:   LOS: 1 day    Latrelle Dodrill, MD [facility] Pager [phone]  If 7PM-7AM, please contact night-coverage [email or web address] Password [email or web address] 08/16/2016, 5:15 PM

## 2016-08-16 NOTE — Care Management Note (Signed)
Case Management Note  Patient Details  Name: Craig [name] MRN: [id number] Date of Birth: 1919-05-01  Subjective/Objective:                  Patient was admitted from home with acute encephalopathy. CM will follow for discharge needs pending PT/OT evals and physician orders.   Action/Plan:   Expected Discharge Date:                  Expected Discharge Plan:     In-House Referral:     Discharge planning Services     Post Acute Care Choice:    Choice offered to:     DME Arranged:    DME Agency:     HH Arranged:    HH Agency:     Status of Service:     If discussed at [employer] Length [employer] Stay Meetings, dates discussed:    Additional Comments:  Anda KraftRobarge, Jaymz Traywick C, RN 08/16/2016, 10:52 AM

## 2016-08-17 DIAGNOSIS — G9340 Encephalopathy, unspecified: Secondary | ICD-10-CM

## 2016-08-17 LAB — BLOOD GAS, ARTERIAL
Acid-base deficit: 3.3 mmol/L — ABNORMAL HIGH (ref 0.0–2.0)
Bicarbonate: 19.6 mmol/L — ABNORMAL LOW (ref 20.0–28.0)
Drawn by: 270221
FIO2: 0.21
O2 Saturation: 96.6 %
PH ART: 7.51 — AB (ref 7.350–7.450)
Patient temperature: 95.5
pCO2 arterial: 24.3 mmHg — ABNORMAL LOW (ref 32.0–48.0)
pO2, Arterial: 78.5 mmHg — ABNORMAL LOW (ref 83.0–108.0)

## 2016-08-17 LAB — BASIC METABOLIC PANEL
Anion gap: 14 (ref 5–15)
BUN: 22 mg/dL — ABNORMAL HIGH (ref 6–20)
CHLORIDE: 107 mmol/L (ref 101–111)
CO2: 20 mmol/L — AB (ref 22–32)
Calcium: 9.6 mg/dL (ref 8.9–10.3)
Creatinine, Ser: 2 mg/dL — ABNORMAL HIGH (ref 0.61–1.24)
GFR calc Af Amer: 31 mL/min — ABNORMAL LOW (ref >60)
GFR calc non Af Amer: 27 mL/min — ABNORMAL LOW (ref >60)
GLUCOSE: 98 mg/dL (ref 65–99)
POTASSIUM: 3.8 mmol/L (ref 3.5–5.1)
SODIUM: 141 mmol/L (ref 135–145)

## 2016-08-17 LAB — CBC
HEMATOCRIT: 43.4 % (ref 39.0–52.0)
HEMOGLOBIN: 15.2 g/dL (ref 13.0–17.0)
MCH: 28.6 pg (ref 26.0–34.0)
MCHC: 35 g/dL (ref 30.0–36.0)
MCV: 81.7 fL (ref 78.0–100.0)
Platelets: 156 10*3/uL (ref 150–400)
RBC: 5.31 MIL/uL (ref 4.22–5.81)
RDW: 15.8 % — ABNORMAL HIGH (ref 11.5–15.5)
WBC: 7.8 10*3/uL (ref 4.0–10.5)

## 2016-08-17 MED ORDER — FAMOTIDINE IN NACL 20-0.9 MG/50ML-% IV SOLN
20.0000 mg | INTRAVENOUS | Status: DC
  Administered 2016-08-18 – 2016-08-19 (×2): 20 mg via INTRAVENOUS
  Filled 2016-08-17 (×2): qty 50

## 2016-08-17 NOTE — [place] FL2 (Signed)
[place] MEDICA[place]2 LEVEL OF CARE SCREENING TOOL     IDENTIFICATION  Patient Name: Craig [name] Birthdate: [birth year] Sex: male Admission Date (Current Location): 08/14/2016  [facility] and [place] Number:  Producer, television/film/video and Address:  [facility]. [facility], [address] [place], [place] [address]      Provider Number: [id number]  Attending Physician Name and Address:  Lenox Ponds, MD  Relative Name and Phone Number:  Arrie Eastern, [phone]    Current Level of Care: Hospital Recommended Level of Care: Skilled Nursing Facility Prior Approval Number:    Date Approved/Denied:   PASRR Number: [id number]  Discharge Plan: SNF    Current Diagnoses: Patient Active Problem List   Diagnosis Date Noted  . Acute encephalopathy 08/14/2016  . Elevated troponin 08/14/2016  . CKD (chronic kidney disease), stage III 08/14/2016  . Aspiration pneumonia (HCC) 08/14/2016  . Acute renal failure superimposed on stage 3 chronic kidney disease (HCC) 08/31/2015  . Sepsis (HCC) 08/31/2015  . Shingles 08/31/2015  . BPH (benign prostatic hyperplasia) 08/31/2015  . Hypothyroidism 08/31/2015  . HCAP (healthcare-associated pneumonia) 04/09/2014  . Respiratory failure with hypoxia (HCC) 04/09/2014  . Atrial fibrillation (HCC) 01/10/2014  . Symptomatic bradycardia 01/10/2014  . Fatigue 12/17/2013  . Atrial flutter (HCC) 10/18/2012  . Midsternal chest pain 10/18/2012  . Chronic systolic CHF (congestive heart failure) (HCC)   . Chest discomfort 04/08/2012  . Sinus bradycardia 10/21/2011  . Acute on chronic combined systolic and diastolic heart failure (HCC) 10/20/2011  . SOB (shortness of breath) on exertion 08/30/2011  . Coronary artery disease   . MI, old   . Mitral insufficiency   . Hypertension   . Hyperlipidemia   . Mobitz (type) I (Wenckebach's) atrioventricular block   . CAD (coronary artery disease)   . Raynaud phenomenon   . Dizziness      Orientation RESPIRATION BLADDER Height & Weight      (Disoriented x4)  Normal Continent Weight: 78.9 kg (174 lb) Height:  6' (182.9 cm)  BEHAVIORAL SYMPTOMS/MOOD NEUROLOGICAL BOWEL NUTRITION STATUS      Continent Diet (Please see DC Summary)  AMBULATORY STATUS COMMUNICATION OF NEEDS Skin   Extensive Assist Verbally Normal                       Personal Care Assistance Level of Assistance  Bathing, Feeding, Dressing Bathing Assistance: Maximum assistance Feeding assistance: Maximum assistance Dressing Assistance: Maximum assistance     Functional Limitations Info             SPECIAL CARE FACTORS FREQUENCY  PT (By licensed PT)     PT Frequency: 5x/week              Contractures      Additional Factors Info  Code Status, Allergies Code Status Info: DNR Allergies Info: Zocor Simvastatin           Current Medications (08/17/2016):  This is the current hospital active medication list Current Facility-Administered Medications  Medication Dose Route Frequency Provider Last Rate Last Dose  . acetaminophen (TYLENOL) tablet 650 mg  650 mg Oral Q4H PRN Lorretta Harp, MD       Or  . acetaminophen (TYLENOL) solution 650 mg  650 mg Per Tube Q4H PRN Lorretta Harp, MD       Or  . acetaminophen (TYLENOL) suppository 650 mg  650 mg Rectal Q4H PRN Lorretta Harp, MD      . albuterol (  PROVENTIL) (2.5 MG/3ML) 0.083% nebulizer solution 2.5 mg  2.5 mg Nebulization Q6H PRN Lorretta HarpXilin Niu, MD      . aspirin suppository 300 mg  300 mg Rectal Daily Lorretta HarpXilin Niu, MD   300 mg at 08/16/16 1052   Or  . aspirin tablet 325 mg  325 mg Oral Daily Lorretta HarpXilin Niu, MD      . famotidine (PEPCID) IVPB 20 mg premix  20 mg Intravenous Q12H Lorretta HarpXilin Niu, MD 100 mL/hr at 08/16/16 2111 20 mg at 08/16/16 2111  . furosemide (LASIX) injection 40 mg  40 mg Intravenous BID Lenox PondsEdwin Silva Zapata, MD   40 mg at 08/16/16 1755  . heparin injection 5,000 Units  5,000 Units Subcutaneous Q8H Lorretta HarpXilin Niu, MD   5,000 Units at 08/17/16 0218   . hydrALAZINE (APRESOLINE) injection 5 mg  5 mg Intravenous Q2H PRN Lorretta HarpXilin Niu, MD      . levothyroxine (SYNTHROID, LEVOTHROID) injection 25 mcg  25 mcg Intravenous Daily Lorretta HarpXilin Niu, MD   25 mcg at 08/16/16 1053  . piperacillin-tazobactam (ZOSYN) IVPB 3.375 g  3.375 g Intravenous Q8H Titus MouldCaron G Amend, RPH 12.5 mL/hr at 08/17/16 [id number] 3.375 g at 08/17/16 [id number]     Discharge Medications: Please see discharge summary for a list of discharge medications.  Relevant Imaging Results:  Relevant Lab Results:   Additional Information SSN: [id number]  [place] Dragoo S Bridge[place], [place]WA

## 2016-08-17 NOTE — Progress Notes (Signed)
Notified attending about pt annul temp is 95.2 and 95.5 but his temps were lower during the day.

## 2016-08-17 NOTE — Progress Notes (Signed)
Speech Language Pathology Treatment: Dysphagia  Patient Details Name: Craig [name] MRN: [id number] DOB: 1918-11-17 Today's Date: 08/17/2016 Time: [id number] SLP Time Calculation (min) (ACUTE ONLY): 12 min  Assessment / Plan / Recommendation Clinical Impression  Patient presents with improved alertness, vocalizes in response to question but does not form words. Displayed oral defensiveness as SLP attempted oral care. Presented ice chip to lips, anterior tongue; patient displayed moderate lingual movement. Does not initiate swallow despite max tactile/verbal cues. Patient refuses presentation of bolus to oral cavity. Terminated session; SLP will continue to follow for PO readiness.   HPI        SLP Plan  Continue with current plan of care     Recommendations  Diet recommendations: NPO Medication Administration: Via alternative means                Oral Care Recommendations: Oral care QID Follow up Recommendations: Other (comment) Plan: Continue with current plan of care       GO              Craig BatonMary Beth Luddie Boghosian, MS CF-SLP Speech-Language Pathologist [phone]  Craig [name] 08/17/2016, 9:41 AM

## 2016-08-17 NOTE — Clinical Social Work Note (Signed)
Clinical Social Work Assessment  Patient Details  Name: Craig [name] MRN: [id number] Date of Birth: 22-May-1919  Date of referral:  08/17/16               Reason for consult:  Facility Placement                Permission sought to share information with:  Facility Medical sales representativeContact Representative, Family Supports Permission granted to share information::  No (Disoriented)  Name::     Higher education careers adviserBernett  Agency::  SNFs  Relationship::  Daughter  Contact Information:  [phone]  Housing/Transportation Living arrangements for the past 2 months:  Single Family Home Source of Information:  Adult Children Patient Interpreter Needed:  None Criminal Activity/Legal Involvement Pertinent to Current Situation/Hospitalization:  No - Comment as needed Significant Relationships:  Adult Children Lives with:  Adult Children Do you feel safe going back to the place where you live?  No Need for family participation in patient care:  Yes (Comment)  Care giving concerns:  CSW received consult for possible SNF placement at time of discharge. CSW spoke with patient's daughter at regarding PT recommendation of SNF placement at time of discharge. Per patient's sdaughter, she is not sure if she is able to care for patient at their home given patient's current physical needs and fall risk. Patient's daughter expressed understanding of PT recommendation and may be agreeable to SNF placement at time of discharge, but she wants to see how he does before making a decision. Patient's daughter gave CSW permission to send out referral. CSW to continue to follow and assist with discharge planning needs.   Social Worker assessment / plan:  CSW spoke with patient's daughter [facility].  Employment status:  Retired Health and safety inspectornsurance information:  Medicare PT Recommendations:  Skilled Nursing Facility Information / Referral to community resources:  Skilled Nursing Facility  Patient/Family's  Response to care:  Patient's daughter recognizes need for rehab before returning home and may be agree[place].   Patient/Family's Understanding of and Emotional Response to Diagnosis, Current Treatment, and Prognosis:  Patient/family is realistic regarding therapy needs and expressed being hopeful for patient's improvement. Patient's daughter expressed understanding of CSW role and discharge process. No questions/concerns about plan or treatment.    Emotional Assessment Appearance:  Appears stated age Attitude/Demeanor/Rapport:  Unable to Assess Affect (typically observed):  Unable to Assess Orientation:   (Disoriented x4) Alcohol / Substance use:  Not Applicable Psych involvement (Current and /or in the community):  No (Comment)  Discharge Needs  Concerns to be addressed:  Care Coordination Readmission within the last 30 days:  No Current discharge risk:  None Barriers to Discharge:  Continued Medical Work up   [employer] S Brodie Scovell, LCSWA 08/17/2016, 9:42 AM

## 2016-08-17 NOTE — Progress Notes (Signed)
MD notified rectal temp 95.3. No new orders given. Will continue to monitor

## 2016-08-17 NOTE — Progress Notes (Signed)
PROGRESS NOTE [facility]   BORA BRONER   RUE:[id number] DOB: 1918-11-15  DOA: 08/14/2016 PCP: [name], MD   Brief Narrative:  83 year old male with past medical history of hypertension, hypothyroidism, CAD, CKD stage III, systolic CHF, AV block status post PPM presented today the after being found by his daughter with AMS and generalized weakness. In the ED CT head negative for acute intracranial abnormalities but MRI could not be done because of pacemaker. Chest x-ray show bilateral basilar opacity. Patient admitted for stroke workup and antibiotics for possible aspiration pneumonia.  I discussed patient with daughter, she reported that patient has been weak for about a week or so with decrease on oral intake. Patient was having difficulty swallowing and one day prior to admission he could not walk at all.  Subjective: Patient more awake and alert today, still confuse, not able to follow commands and non verbal   Assessment & Plan: Acute encephalopathy and possible stroke - doubt stroke giving generalized weakness - initial CT with no acute abnormalities -Repeated CT negative for stroke  -No MRI due to pacemaker -AMS could be secondary to patient pneumonia noted on checks x-ray - daughter report difficulty swallowing and eating the previous week -ASA 325 -Neurology recommendations appreciated they have signed off  -Swallow eval recommended NPO   Aspiration pneumonia - difficulty swallowing  -Continue IV antibiotic Zosyn -Follow-up cultures - no growth in 2 days   Chronic systolic CHF - CXR show signs of fluid overload  Continue  IV lasix 40 mg BID  Other meds on hold given patient obtunded  Elevated trop and hx of CAD Likely demand ischemia Cardiology recommendations appreciated - given advanced patient functional status no further workup  Hypertension - stable Monitor BP  Atrial Flutter/Fib - heart rate well controlled, on home Eliquis, CHADVASC  4 Given currently nothing by mouth Eliquis is on hold IV heparin indicated at this time Continue to monitor in telemetry  CKD (chronic kidney disease), stage III: - Improved with IV fluid Cr worsening - likely cardiorenal syndrome   Again I have discussed the prognosis of this patient with the daughter, she understands that patient is very sick and he could not recover from this hospitalization. I have offered palliative care consultation, family members agree. At this point we'll continue treating infection and see day by day to see patient's response. Palliative care consulted  DVT prophylaxis: Sq Heparin  Code Status: DNR Family Communication: Discussed with daughter via phone Disposition Plan: Palliative care   Consultants:   Neurology   Cardiology   Procedures:   ECHO   Carotid Doppler   Antimicrobials:  Zosyn 08/14/16   Objective: Vitals:   08/17/16 0724 08/17/16 1008 08/17/16 1441 08/17/16 1722  BP:  136/81 137/80 (!) 134/97  Pulse:  65 (!) 58 64  Resp:  16 15 16   Temp: (!) 95.3 F (35.2 C)     TempSrc: Rectal     SpO2:  98% 99% 98%  Weight:      Height:        Intake/Output Summary (Last 24 hours) at 08/17/16 1917 Last data filed at 08/17/16 0219  Gross per 24 hour  Intake                0 ml  Output             1000 ml  Net            -1000 ml   [employer]  08/14/16 2009  Weight: 78.9 kg (174 lb)    Examination:  General exam: Frail, Awake and alert Respiratory system: BS diminished, crackles throughout  Cardiovascular system: S1S2 Irr Irr S4 4/6 SM No JVD, rubs or gallops Central nervous system: Unable to perform, patient lethargic and not following commands  Extremities: No pedal edema  Data Reviewed: I have personally reviewed following labs and imaging studies  CBC:  Recent Labs Lab 08/14/16 2102 08/14/16 2116 08/16/16 0354 08/17/16 0209  WBC 7.1  --  9.0 7.8  NEUTROABS 4.9  --  6.2  --   HGB 14.3 15.3 14.4 15.2  HCT 40.1  45.0 41.5 43.4  MCV 82.9  --  82.8 81.7  PLT 195  --  178 156   Basic Metabolic Panel:  Recent Labs Lab 08/14/16 2102 08/14/16 2116 08/16/16 0354 08/17/16 0526  NA 136 138 139 141  K 3.9 3.9 3.9 3.8  CL 103 103 105 107  CO2 23  --  19* 20*  GLUCOSE 112* 107* 122* 98  BUN 19 23* 21* 22*  CREATININE 1.41* 1.30* 1.61* 2.00*  CALCIUM 9.8  --  9.6 9.6   GFR: Estimated Creatinine Clearance: 24.3 mL/min (by C-G formula based on SCr of 2 mg/dL (H)). Liver Function Tests:  Recent Labs Lab 08/14/16 2102  AST 27  ALT 22  ALKPHOS 113  BILITOT 0.6  PROT 7.3  ALBUMIN 3.8   No results for input(s): LIPASE, AMYLASE in the last 168 hours. No results for input(s): AMMONIA in the last 168 hours. Coagulation Profile:  Recent Labs Lab 08/14/16 2102  INR 1.23   Cardiac Enzymes:  Recent Labs Lab 08/14/16 0018 08/14/16 2106 08/15/16 0447 08/15/16 1141  CKTOTAL  --  77  --   --   TROPONINI 0.12*  --  0.12* 0.13*   BNP (last 3 results) No results for input(s): PROBNP in the last 8760 hours. HbA1C:  Recent Labs  08/15/16 0447  HGBA1C 6.2*   CBG:  Recent Labs Lab 08/14/16 2008  GLUCAP 110*   Lipid Profile:  Recent Labs  08/15/16 0447  CHOL 138  HDL 52  LDLCALC 78  TRIG 39  CHOLHDL 2.7   Thyroid Function Tests: No results for input(s): TSH, T4TOTAL, FREET4, T3FREE, THYROIDAB in the last 72 hours. Anemia Panel: No results for input(s): VITAMINB12, FOLATE, FERRITIN, TIBC, IRON, RETICCTPCT in the last 72 hours. Sepsis Labs: No results for input(s): PROCALCITON, LATICACIDVEN in the last 168 hours.  Recent Results (from the past 240 hour(s))  Culture, blood (routine x 2) Call MD if unable to obtain prior to antibiotics being given     Status: None (Preliminary result)   Collection Time: 08/14/16 11:50 PM  Result Value Ref Range Status   Specimen Description BLOOD LEFT HAND  Final   Special Requests   Final    BOTTLES DRAWN AEROBIC AND ANAEROBIC 7CC AER,5CC  ANA   Culture NO GROWTH 2 DAYS  Final   Report Status PENDING  Incomplete  Culture, blood (routine x 2) Call MD if unable to obtain prior to antibiotics being given     Status: None (Preliminary result)   Collection Time: 08/14/16 11:58 PM  Result Value Ref Range Status   Specimen Description BLOOD RIGHT ARM  Final   Special Requests BOTTLES DRAWN AEROBIC ONLY 5CC  Final   Culture NO GROWTH 2 DAYS  Final   Report Status PENDING  Incomplete      Radiology Studies: Ct Head Wo Contrast  Result Date: 08/16/2016 CLINICAL DATA:  Stroke with sleepiness this morning. EXAM: CT HEAD WITHOUT CONTRAST TECHNIQUE: Contiguous axial images were obtained from the base of the skull through the vertex without intravenous contrast. COMPARISON:  08/14/2016 FINDINGS: Brain: No evidence of acute infarction, hemorrhage, hydrocephalus, extra-axial collection or mass lesion/mass effect. Atrophy, especially prominent in the mesial temporal lobes. In the setting of dementia, this pattern is seen with Alzheimer's disease. Chronic microvascular disease with ischemic gliosis symmetric in the bilateral cerebral white matter. Vascular: No hyperdense vessel.  Atherosclerotic calcification. Skull: No acute or aggressive finding. Sinuses/Orbits: Bilateral cataract resection.  No acute finding. IMPRESSION: 1. No acute finding or change from 2 days prior. 2. Atrophy as described above.  Chronic microvascular disease. Electronically Signed   By: Marnee SpringJonathon  Watts M.D.   On: 08/16/2016 08:36   Dg Chest Port 1 View  Result Date: 08/16/2016 CLINICAL DATA:  Aspiration pneumonia. EXAM: PORTABLE CHEST 1 VIEW COMPARISON:  08/14/2016. FINDINGS: Cardiac pacer with lead tip projected right ventricle. Cardiomegaly with pulmonary vascular prominence and mild bilateral interstitial prominence. Small bilateral effusions. Findings consistent with CHF. IMPRESSION: 1. Cardiac pacer with lead tip over the right ventricle. 2. Congestive heart failure with  mild bilateral pulmonary interstitial edema. Pneumonitis cannot be excluded. Small bilateral pleural effusions. These findings have increased slightly from prior exam. Electronically Signed   By: Maisie Fushomas  Register   On: 08/16/2016 07:45    Scheduled Meds: . aspirin  300 mg Rectal Daily   Or  . aspirin  325 mg Oral Daily  . [START ON 08/18/2016] famotidine (PEPCID) IV  20 mg Intravenous Q24H  . furosemide  40 mg Intravenous BID  . heparin  5,000 Units Subcutaneous Q8H  . levothyroxine  25 mcg Intravenous Daily  . piperacillin-tazobactam (ZOSYN)  IV  3.375 g Intravenous Q8H   Continuous Infusions:   LOS: 2 days    Latrelle DodrillEdwin Silva, MD [facility] Pager [phone]  If 7PM-7AM, please contact night-coverage [email or web address] Password [facility] 08/17/2016, 7:17 PM

## 2016-08-18 LAB — CBC
HEMATOCRIT: 44.3 % (ref 39.0–52.0)
Hemoglobin: 15.4 g/dL (ref 13.0–17.0)
MCH: 29.4 pg (ref 26.0–34.0)
MCHC: 34.8 g/dL (ref 30.0–36.0)
MCV: 84.7 fL (ref 78.0–100.0)
Platelets: 165 10*3/uL (ref 150–400)
RBC: 5.23 MIL/uL (ref 4.22–5.81)
RDW: 16.3 % — AB (ref 11.5–15.5)
WBC: 9.8 10*3/uL (ref 4.0–10.5)

## 2016-08-18 LAB — BASIC METABOLIC PANEL
Anion gap: 16 — ABNORMAL HIGH (ref 5–15)
BUN: 29 mg/dL — AB (ref 6–20)
CO2: 21 mmol/L — ABNORMAL LOW (ref 22–32)
Calcium: 9.7 mg/dL (ref 8.9–10.3)
Chloride: 109 mmol/L (ref 101–111)
Creatinine, Ser: 2.55 mg/dL — ABNORMAL HIGH (ref 0.61–1.24)
GFR calc Af Amer: 23 mL/min — ABNORMAL LOW (ref >60)
GFR calc non Af Amer: 20 mL/min — ABNORMAL LOW (ref >60)
GLUCOSE: 90 mg/dL (ref 65–99)
POTASSIUM: 4 mmol/L (ref 3.5–5.1)
Sodium: 146 mmol/L — ABNORMAL HIGH (ref 135–145)

## 2016-08-18 MED ORDER — CHLORHEXIDINE GLUCONATE 0.12 % MT SOLN
15.0000 mL | Freq: Two times a day (BID) | OROMUCOSAL | Status: DC
  Administered 2016-08-18 – 2016-08-24 (×13): 15 mL via OROMUCOSAL
  Filled 2016-08-18 (×8): qty 15

## 2016-08-18 MED ORDER — ORAL CARE MOUTH RINSE
15.0000 mL | Freq: Two times a day (BID) | OROMUCOSAL | Status: DC
Start: 2016-08-18 — End: 2016-08-24
  Administered 2016-08-18 – 2016-08-24 (×10): 15 mL via OROMUCOSAL

## 2016-08-18 MED ORDER — DEXTROSE-NACL 5-0.45 % IV SOLN
INTRAVENOUS | Status: DC
  Administered 2016-08-18: 14:00:00 via INTRAVENOUS

## 2016-08-18 MED ORDER — PIPERACILLIN-TAZOBACTAM IN DEX 2-0.25 GM/50ML IV SOLN
2.2500 g | Freq: Three times a day (TID) | INTRAVENOUS | Status: DC
  Administered 2016-08-18 – 2016-08-19 (×4): 2.25 g via INTRAVENOUS
  Filled 2016-08-18 (×4): qty 50

## 2016-08-18 NOTE — Progress Notes (Signed)
Pharmacy Antibiotic Note  Craig [name] is a 83 y.o. male admitted on 08/14/2016 with possible asp PNA.  Pharmacy has been consulted for Zosyn dosing.  Day # 5 of Zosyn therapy Blood cultures negative to date,  Scr continues to worsen  Plan: Decrease Zosyn to 2.25 grams iv Q 8 hours Add stop date for 7 to 8 days of therapy?  Height: 6' (182.9 cm) Weight: 174 lb (78.9 kg) IBW/kg (Calculated) : 77.6  Temp (24hrs), Avg:96.3 F (35.7 C), Min:96.1 F (35.6 C), Max:96.5 F (35.8 C)   Recent Labs Lab 08/14/16 2102 08/14/16 2116 08/16/16 0354 08/17/16 0209 08/17/16 0526 08/18/16 0427  WBC 7.1  --  9.0 7.8  --  9.8  CREATININE 1.41* 1.30* 1.61*  --  2.00* 2.55*    Estimated Creatinine Clearance: 19 mL/min (by C-G formula based on SCr of 2.55 mg/dL (H)).    Allergies  Allergen Reactions  . Zocor [Simvastatin] Other (See Comments)    Unknown reaction per pt   Thank you Okey RegalLisa Dietrick Barris, PharmD [phone] 08/18/2016 11:06 AM

## 2016-08-18 NOTE — Progress Notes (Addendum)
Speech Language Pathology Treatment: Dysphagia  Patient Details Name: Craig PhenesWilbur R Trombly MRN: [id number] DOB: 06-13-1919 Today's Date: 08/18/2016 Time: [id number] SLP Time Calculation (min) (ACUTE ONLY): 13 min  Assessment / Plan / Recommendation Clinical Impression  RN reports patient with improved alertness, has been tolerating mouth care. Patient alert upon SLP entry, mouth care performed. Improved lingual movement, however patient is unable to initiate swallow sequence despite verbal and tactile cues. Presented with ice chip; anterior bolus loss x2. Patient makes minimal motions for bolus manipulation/containment. Ceased PO trials; removed ice chip from oral cavity. Provided education to patient regarding swallow, recommendations. Patient makes eye contact and blinks once to indicate yes. RN reports palliative consult pending. Recommend continuing NPO, medications via alternative means. SLP will continue to follow.    HPI HPI: 83 y.o. male with medical history significant of hypertension, hyperlipidemia, asthma, hypothyroidism, gout, pacemaker placement, mitral valve insufficiency, CAD, PTSD, chronic kidney disease-stage III, systolic congestive heart failure with EF for 45-50 percent, atrial fibrillation on Eliquis, BPH, shingles, who presents with AMS, poor balance and weakness, cough      SLP Plan  Continue with current plan of care     Recommendations  Diet recommendations: NPO Medication Administration: Via alternative means                Oral Care Recommendations: Oral care QID Follow up Recommendations: Other (comment) (TBD) Plan: Continue with current plan of care       GO              Rondel BatonMary Beth Trentyn Boisclair, [place]MS CF-SLP Speech-Language Pathologist [phone]  Arlana LindauMary E Boni Maclellan 08/18/2016, 3:33 PM

## 2016-08-18 NOTE — Progress Notes (Addendum)
PROGRESS NOTE [facility]   Craig [name]   ONG:[id number] DOB: 04/19/1919  DOA: 08/14/2016 PCP: [name], MD   Brief Narrative:  83 year old male with past medical history of hypertension, hypothyroidism, CAD, CKD stage III, systolic CHF, AV block status post PPM presented today the after being found by his daughter with AMS and generalized weakness. In the ED CT head negative for acute intracranial abnormalities but MRI could not be done because of pacemaker. Chest x-ray show bilateral basilar opacity. Patient admitted for stroke workup and antibiotics for possible aspiration pneumonia.  I discussed patient with daughter, she reported that patient has been weak for about a week or so with decrease on oral intake. Patient was having difficulty swallowing and one day prior to admission he could not walk at all.  Subjective: Continues to be alert today, blink when indicated, not really following commands   Assessment & Plan: Acute encephalopathy and possible stroke - doubt stroke giving generalized weakness - initial CT with no acute abnormalities -Repeated CT negative for stroke  -No MRI due to pacemaker -AMS could be secondary to patient pneumonia noted on chest x-ray - daughter report difficulty swallowing and eating the previous week - likely causing aspiration  -Deconditioning, worsening in dementia vs natural decline in cognition -ASA 325 -Neurology recommendations appreciated they have signed off  -Swallow eval recommended NPO   -Continue to monitor  -Warm blankets   Aspiration pneumonia - difficulty swallowing  -Continue IV antibiotic Zosyn treatment for 7 days de-escalate treatment - will defer to pharmacy  -Recommendations appreciated  -Follow-up cultures - no growth in 2 days   Chronic systolic CHF - CXR show signs of fluid overload  Continue  IV lasix 40 mg BID  Other meds on hold given patient obtunded  Elevated trop and hx of CAD Likely  demand ischemia Cardiology recommendations appreciated - given advanced patient functional status no further workup  Hypertension - stable Monitor BP  Atrial Flutter/Fib - heart rate well controlled, on home Eliquis, CHADVASC 4 Given currently nothing by mouth Eliquis is on hold IV heparin indicated at this time Continue to monitor in telemetry  AcoCKD (chronic kidney disease), stage III: - Iikely due to Lasix  Will give gentle hydration, patient also NPO dehydration component as well   Again I have discussed the prognosis of this patient with the daughter, she understands that patient is very sick and he could not recover from this hospitalization. I have offered palliative care consultation, family members agree. At this point we'll continue treating infection and see day by day to see patient's response. Palliative care consulted  DVT prophylaxis: Sq Heparin  Code Status: DNR Family Communication: Discussed with daughter via phone Disposition Plan: Palliative care   Consultants:   Neurology   Cardiology   Procedures:   ECHO   Carotid Doppler   Antimicrobials:  Zosyn 08/14/16   Objective: Vitals:   08/17/16 2110 08/18/16 0127 08/18/16 0548 08/18/16 0920  BP: 113/79 (!) 142/77 (!) 150/84 124/87  Pulse: 60 72 (!) 109 (!) 59  Resp: (!) 24  20 (!) 22  Temp: (!) 96.4 F (35.8 C) (!) 96.5 F (35.8 C) (!) 96.2 F (35.7 C) (!) 96.1 F (35.6 C)  TempSrc: Rectal Rectal Rectal Rectal  SpO2: 100% 100% 97% 100%  Weight:      Height:        Intake/Output Summary (Last 24 hours) at 08/18/16 1650 Last data filed at 08/18/16 0604  Gross per 24 hour  Intake              300 ml  Output              525 ml  Net             -225 ml   Filed Weights   08/14/16 2009  Weight: 78.9 kg (174 lb)    Examination:  General exam: Frail, alert Respiratory system: Improve in aeration, mild diffuse rales  Cardiovascular system: S1S2 Irr Irr S4 4/6 SM No JVD, rubs or  gallops Central nervous system: confuse, not follow commands.  Extremities: No pedal edema  Data Reviewed: I have personally reviewed following labs and imaging studies  CBC:  Recent Labs Lab 08/14/16 2102 08/14/16 2116 08/16/16 0354 08/17/16 0209 08/18/16 0427  WBC 7.1  --  9.0 7.8 9.8  NEUTROABS 4.9  --  6.2  --   --   HGB 14.3 15.3 14.4 15.2 15.4  HCT 40.1 45.0 41.5 43.4 44.3  MCV 82.9  --  82.8 81.7 84.7  PLT 195  --  178 156 165   Basic Metabolic Panel:  Recent Labs Lab 08/14/16 2102 08/14/16 2116 08/16/16 0354 08/17/16 0526 08/18/16 0427  NA 136 138 139 141 146*  K 3.9 3.9 3.9 3.8 4.0  CL 103 103 105 107 109  CO2 23  --  19* 20* 21*  GLUCOSE 112* 107* 122* 98 90  BUN 19 23* 21* 22* 29*  CREATININE 1.41* 1.30* 1.61* 2.00* 2.55*  CALCIUM 9.8  --  9.6 9.6 9.7   GFR: Estimated Creatinine Clearance: 19 mL/min (by C-G formula based on SCr of 2.55 mg/dL (H)). Liver Function Tests:  Recent Labs Lab 08/14/16 2102  AST 27  ALT 22  ALKPHOS 113  BILITOT 0.6  PROT 7.3  ALBUMIN 3.8   No results for input(s): LIPASE, AMYLASE in the last 168 hours. No results for input(s): AMMONIA in the last 168 hours. Coagulation Profile:  Recent Labs Lab 08/14/16 2102  INR 1.23   Cardiac Enzymes:  Recent Labs Lab 08/14/16 0018 08/14/16 2106 08/15/16 0447 08/15/16 1141  CKTOTAL  --  77  --   --   TROPONINI 0.12*  --  0.12* 0.13*   BNP (last 3 results) No results for input(s): PROBNP in the last 8760 hours. HbA1C: No results for input(s): HGBA1C in the last 72 hours. CBG:  Recent Labs Lab 08/14/16 2008  GLUCAP 110*   Lipid Profile: No results for input(s): CHOL, HDL, LDLCALC, TRIG, CHOLHDL, LDLDIRECT in the last 72 hours. Thyroid Function Tests: No results for input(s): TSH, T4TOTAL, FREET4, T3FREE, THYROIDAB in the last 72 hours. Anemia Panel: No results for input(s): VITAMINB12, FOLATE, FERRITIN, TIBC, IRON, RETICCTPCT in the last 72 hours. Sepsis  Labs: No results for input(s): PROCALCITON, LATICACIDVEN in the last 168 hours.  Recent Results (from the past 240 hour(s))  Culture, blood (routine x 2) Call MD if unable to obtain prior to antibiotics being given     Status: None (Preliminary result)   Collection Time: 08/14/16 11:50 PM  Result Value Ref Range Status   Specimen Description BLOOD LEFT HAND  Final   Special Requests   Final    BOTTLES DRAWN AEROBIC AND ANAEROBIC 7CC AER,5CC ANA   Culture NO GROWTH 3 DAYS  Final   Report Status PENDING  Incomplete  Culture, blood (routine x 2) Call MD if unable to obtain prior to antibiotics being given     Status: None (Preliminary  result)   Collection Time: 08/14/16 11:58 PM  Result Value Ref Range Status   Specimen Description BLOOD RIGHT ARM  Final   Special Requests BOTTLES DRAWN AEROBIC ONLY 5CC  Final   Culture NO GROWTH 3 DAYS  Final   Report Status PENDING  Incomplete      Radiology Studies: No results found.  Scheduled Meds: . aspirin  300 mg Rectal Daily   Or  . aspirin  325 mg Oral Daily  . chlorhexidine  15 mL Mouth Rinse BID  . famotidine (PEPCID) IV  20 mg Intravenous Q24H  . heparin  5,000 Units Subcutaneous Q8H  . levothyroxine  25 mcg Intravenous Daily  . mouth rinse  15 mL Mouth Rinse q12n4p  . piperacillin-tazobactam (ZOSYN)  IV  2.25 g Intravenous Q8H   Continuous Infusions: . dextrose 5 % and 0.45% NaCl 42 mL/hr at 08/18/16 1344     LOS: 3 days    Latrelle Dodrill, MD [facility] Pager [phone]  If 7PM-7AM, please contact night-coverage [email or web address] Password [facility] 08/18/2016, 4:50 PM

## 2016-08-19 DIAGNOSIS — Z515 Encounter for palliative care: Secondary | ICD-10-CM

## 2016-08-19 DIAGNOSIS — Z7189 Other specified counseling: Secondary | ICD-10-CM

## 2016-08-19 MED ORDER — HALOPERIDOL 1 MG PO TABS: 0.5000 mg | ORAL_TABLET | ORAL | Status: DC | PRN

## 2016-08-19 MED ORDER — MORPHINE SULFATE (CONCENTRATE) 10 MG/0.5ML PO SOLN: 5.0000 mg | ORAL | Status: DC | PRN

## 2016-08-19 MED ORDER — POLYVINYL ALCOHOL 1.4 % OP SOLN
1.0000 [drp] | Freq: Four times a day (QID) | OPHTHALMIC | Status: DC | PRN
  Filled 2016-08-19: qty 15

## 2016-08-19 MED ORDER — GLYCOPYRROLATE 0.2 MG/ML IJ SOLN: 0.2000 mg | INTRAMUSCULAR | Status: DC | PRN

## 2016-08-19 MED ORDER — ACETAMINOPHEN 325 MG PO TABS: 650.0000 mg | ORAL_TABLET | Freq: Four times a day (QID) | ORAL | Status: DC | PRN

## 2016-08-19 MED ORDER — BIOTENE DRY MOUTH MT LIQD: 15.0000 mL | OROMUCOSAL | Status: DC | PRN

## 2016-08-19 MED ORDER — ONDANSETRON HCL 4 MG/2ML IJ SOLN
4.0000 mg | Freq: Four times a day (QID) | INTRAMUSCULAR | Status: DC | PRN
Start: 2016-08-19 — End: 2016-08-24

## 2016-08-19 MED ORDER — ACETAMINOPHEN 650 MG RE SUPP: 650.0000 mg | Freq: Four times a day (QID) | RECTAL | Status: DC | PRN

## 2016-08-19 MED ORDER — GLYCOPYRROLATE 0.2 MG/ML IJ SOLN
0.2000 mg | INTRAMUSCULAR | Status: DC | PRN
  Administered 2016-08-24: 0.2 mg via INTRAVENOUS
  Filled 2016-08-19: qty 1

## 2016-08-19 MED ORDER — SODIUM CHLORIDE 0.9 % IV SOLN: INTRAVENOUS | Status: DC

## 2016-08-19 MED ORDER — GLYCOPYRROLATE 1 MG PO TABS: 1.0000 mg | ORAL_TABLET | ORAL | Status: DC | PRN

## 2016-08-19 MED ORDER — HALOPERIDOL LACTATE 5 MG/ML IJ SOLN: 0.5000 mg | INTRAMUSCULAR | Status: DC | PRN

## 2016-08-19 MED ORDER — ONDANSETRON 4 MG PO TBDP: 4.0000 mg | ORAL_TABLET | Freq: Four times a day (QID) | ORAL | Status: DC | PRN

## 2016-08-19 MED ORDER — HALOPERIDOL LACTATE 2 MG/ML PO CONC
0.5000 mg | ORAL | Status: DC | PRN
  Filled 2016-08-19: qty 0.3

## 2016-08-19 NOTE — Progress Notes (Signed)
Speech Language Pathology Treatment: Dysphagia  Patient Details Name: Camelia PhenesWilbur R Cannella MRN: [id number] DOB: 09-28-1918 Today's Date: 08/19/2016 Time: [id number] SLP Time Calculation (min) (ACUTE ONLY): 20 min  Assessment / Plan / Recommendation Clinical Impression  Skilled treatment session focused on dysphagia goals. SLP facilitated the session by providing oral care followed by Max multimodal cues for oral awareness of ice chip. Pt was alert but didn't demonstrate any oral response to ice chip and no swallow initiation in response to Max A multimodal cues. Ice chip was removed by SLP. Recommend pt remain NPO. Nursing notified.    HPI HPI: 83 y.o. male with medical history significant of hypertension, hyperlipidemia, asthma, hypothyroidism, gout, pacemaker placement, mitral valve insufficiency, CAD, PTSD, chronic kidney disease-stage III, systolic congestive heart failure with EF for 45-50 percent, atrial fibrillation on Eliquis, BPH, shingles, who presents with AMS, poor balance and weakness, cough      SLP Plan  Continue with current plan of care     Recommendations  Diet recommendations: NPO Medication Administration: Via alternative means                Oral Care Recommendations: Oral care QID Follow up Recommendations:  (TBD) Plan: Continue with current plan of care       GO             Alasha Mcguinness B. Dreama Saaverton, M.S., CCC-SLP Speech-Language Pathologist   Haldon Carley 08/19/2016, 10:16 AM

## 2016-08-19 NOTE — Progress Notes (Addendum)
PROGRESS NOTE [facility]   Craig [name]   ZOX:[id number] DOB: 01/19/1919  DOA: 08/14/2016 PCP: [name], MD   Brief Narrative:  83 year old male with past medical history of hypertension, hypothyroidism, CAD, CKD stage III, systolic CHF, AV block status post PPM presented today the after being found by his daughter with AMS and generalized weakness. In the ED CT head negative for acute intracranial abnormalities but MRI could not be done because of pacemaker. Chest x-ray show bilateral basilar opacity. Patient admitted for stroke workup and antibiotics for possible aspiration pneumonia.  I discussed patient with daughter, she reported that patient has been weak for about a week or so with decrease on oral intake. Patient was having difficulty swallowing and one day prior to admission he could not walk at all.  Subjective: Alert and awake today, follow some commands. Patient is probably actively dying, and prognosis is poor. Palliative care have discussed with family and decision was made for comfort measures   Assessment & Plan: Acute encephalopathy - doubt stroke giving generalized weakness - initial CT with no acute abnormalities - Comfort care - patient would benefit from hospice facility  -Repeated CT negative for stroke  -No MRI due to pacemaker -Encephalopathy could be secondary to patient pneumonia noted on chest x-ray - daughter report difficulty swallowing and eating the previous week - likely causing aspiration  -Deconditioning, worsening in dementia vs natural decline in cognition -Neurology recommendations appreciated they have signed off   Aspiration pneumonia - difficulty swallowing  -Completed 5 days of Zosyn with minimal improvement - continues to have difficulty with swallow  -Ongoing swallow daily eval - please evaluated for comfort feeds -Follow-up cultures - no growth in 4 days   Chronic systolic CHF - CXR show signs of fluid overload    Lasix d/ed 08/18/10 Comfort measures only   Elevated trop and hx of CAD Likely demand ischemia Cardiology recommendations appreciated - given advanced patient functional status no further workup  Hypertension - stable Monitor BP  Atrial Flutter/Fib - heart rate well controlled, on home Eliquis, CHADVASC 4 Given currently nothing by mouth Eliquis is on hold IV heparin not indicated at this time Continue to monitor in telemetry  AcoCKD (chronic kidney disease), stage III: - Iikely due to Lasix  Despite hydration Cr continues to worsen   Discussed comfort care with family, they in agreement of hospice facility. No more blood draws.   DVT prophylaxis: Sq Heparin  Code Status: DNR Family Communication: Discussed with daughter at bedside Disposition Plan: Hospice    Consultants:   Neurology   Cardiology   Procedures:   ECHO   Carotid Doppler   Antimicrobials:  Zosyn 08/14/16   Objective: Vitals:   08/19/16 0155 08/19/16 0544 08/19/16 1002 08/19/16 1209  BP: (!) 147/90 123/80 131/64   Pulse: 64 60 66   Resp: (!) 22 (!) 22 (!) 24   Temp: (!) 96.6 F (35.9 C)   97.1 F (36.2 C)  TempSrc: Rectal Rectal  Rectal  SpO2: 100% 100% 100%   Weight:      Height:        Intake/Output Summary (Last 24 hours) at 08/19/16 1608 Last data filed at 08/19/16 1208  Gross per 24 hour  Intake            883.2 ml  Output             1250 ml  Net           -366.8 ml  Filed Weights   08/14/16 2009  Weight: 78.9 kg (174 lb)    Examination:  General exam: Frail, alert respond to name  Respiratory system: Good air entry, mild diffuse rales  Cardiovascular system: S1S2 Irr Irr S4 4/6 SM No JVD, rubs or gallops Central nervous system: Non verbal, follow some commands.  Extremities: No pedal edema  Data Reviewed: I have personally reviewed following labs and imaging studies  CBC:  Recent Labs Lab 08/14/16 2102 08/14/16 2116 08/16/16 0354 08/17/16 0209 08/18/16 0427   WBC 7.1  --  9.0 7.8 9.8  NEUTROABS 4.9  --  6.2  --   --   HGB 14.3 15.3 14.4 15.2 15.4  HCT 40.1 45.0 41.5 43.4 44.3  MCV 82.9  --  82.8 81.7 84.7  PLT 195  --  178 156 165   Basic Metabolic Panel:  Recent Labs Lab 08/14/16 2102 08/14/16 2116 08/16/16 0354 08/17/16 0526 08/18/16 0427  NA 136 138 139 141 146*  K 3.9 3.9 3.9 3.8 4.0  CL 103 103 105 107 109  CO2 23  --  19* 20* 21*  GLUCOSE 112* 107* 122* 98 90  BUN 19 23* 21* 22* 29*  CREATININE 1.41* 1.30* 1.61* 2.00* 2.55*  CALCIUM 9.8  --  9.6 9.6 9.7   GFR: Estimated Creatinine Clearance: 19 mL/min (by C-G formula based on SCr of 2.55 mg/dL (H)). Liver Function Tests:  Recent Labs Lab 08/14/16 2102  AST 27  ALT 22  ALKPHOS 113  BILITOT 0.6  PROT 7.3  ALBUMIN 3.8   No results for input(s): LIPASE, AMYLASE in the last 168 hours. No results for input(s): AMMONIA in the last 168 hours. Coagulation Profile:  Recent Labs Lab 08/14/16 2102  INR 1.23   Cardiac Enzymes:  Recent Labs Lab 08/14/16 0018 08/14/16 2106 08/15/16 0447 08/15/16 1141  CKTOTAL  --  77  --   --   TROPONINI 0.12*  --  0.12* 0.13*   BNP (last 3 results) No results for input(s): PROBNP in the last 8760 hours. HbA1C: No results for input(s): HGBA1C in the last 72 hours. CBG:  Recent Labs Lab 08/14/16 2008  GLUCAP 110*   Lipid Profile: No results for input(s): CHOL, HDL, LDLCALC, TRIG, CHOLHDL, LDLDIRECT in the last 72 hours. Thyroid Function Tests: No results for input(s): TSH, T4TOTAL, FREET4, T3FREE, THYROIDAB in the last 72 hours. Anemia Panel: No results for input(s): VITAMINB12, FOLATE, FERRITIN, TIBC, IRON, RETICCTPCT in the last 72 hours. Sepsis Labs: No results for input(s): PROCALCITON, LATICACIDVEN in the last 168 hours.  Recent Results (from the past 240 hour(s))  Culture, blood (routine x 2) Call MD if unable to obtain prior to antibiotics being given     Status: None (Preliminary result)   Collection Time:  08/14/16 11:50 PM  Result Value Ref Range Status   Specimen Description BLOOD LEFT HAND  Final   Special Requests   Final    BOTTLES DRAWN AEROBIC AND ANAEROBIC 7CC AER,5CC ANA   Culture NO GROWTH 4 DAYS  Final   Report Status PENDING  Incomplete  Culture, blood (routine x 2) Call MD if unable to obtain prior to antibiotics being given     Status: None (Preliminary result)   Collection Time: 08/14/16 11:58 PM  Result Value Ref Range Status   Specimen Description BLOOD RIGHT ARM  Final   Special Requests BOTTLES DRAWN AEROBIC ONLY 5CC  Final   Culture NO GROWTH 4 DAYS  Final   Report  Status PENDING  Incomplete      Radiology Studies: No results found.  Scheduled Meds: . chlorhexidine  15 mL Mouth Rinse BID  . mouth rinse  15 mL Mouth Rinse q12n4p   Continuous Infusions: . sodium chloride       LOS: 4 days    Latrelle DodrillEdwin Silva, MD [facility] Pager [phone]  If 7PM-7AM, please contact night-coverage [email or web address] Password [email or web address] 08/19/2016, 4:08 PM

## 2016-08-19 NOTE — Care Management Important Message (Signed)
Important Message  Patient Details  Name: Craig PhenesWilbur R [name] MRN: [id number] Date of Birth: [birth year]   Medicare Important Message Given:  Yes    [name] [name] 08/19/2016, 4:40 PM

## 2016-08-19 NOTE — Consult Note (Signed)
Consultation Note Date: 08/19/2016   Patient Name: Craig [name]  DOB: Feb 28, 1919  MRN: [id number]  Age / Sex: 83 y.o., male  PCP: Elias Else, MD Referring Physician: Lenox Ponds, MD  Reason for Consultation: Establishing goals of care and Hospice Evaluation  HPI/Patient Profile: 83 y.o. male  with past medical history of ASCVD, AFib, AS admitted on 08/14/2016 with altered mental status, aspiration PNA-acute decline from his baseline.  Has received 7 days of IV antibiotics and supportive care with minimal improvement.    Clinical Assessment and Goals of Care: Patient appears to be actively dying- he isn't swallowing or responding appropriately. Extremely lethargic and weak.  HCPOA- daughter Ms. Ferrrell   I spoke with his daughter at length- comfort is the primary goal. They are open to hospice options. She is calling in the family who are geographically dispered-including a son in [place].   SUMMARY OF RECOMMENDATIONS    Code Status/Advance Care Planning:  DNR    Symptom Management:   PRN Morphine  EOL order set  Comfort feeding  Palliative Prophylaxis:   Aspiration, Delirium Protocol, Frequent Pain Assessment, Oral Care and Turn Reposition  Additional Recommendations (Limitations, Scope, Preferences):  Full Comfort Care  Psycho-social/Spiritual:   Desire for further Chaplaincy support:no  Additional Recommendations: Caregiving  Support/Resources  Prognosis:   < 2 weeks  Discharge Planning: Hospice facility      Primary Diagnoses: Present on Admission: . Atrial fibrillation (HCC) . BPH (benign prostatic hyperplasia) . CAD (coronary artery disease) . Chronic systolic CHF (congestive heart failure) (HCC) . Hypertension . Hypothyroidism . Acute encephalopathy . Elevated troponin . CKD (chronic kidney disease), stage III . Aspiration pneumonia (HCC)   I  have reviewed the medical record, interviewed the patient and family, and examined the patient. The following aspects are pertinent.  Past Medical History:  Diagnosis Date  . Asthma   . 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 Chronic systolic CHF (congestive heart failure) (HCC)    a. 10/2011 Echo: EF 40-45%, inf AK, PASP .  . CKD (chronic kidney disease), stage III   . Coronary artery disease    a. Remote lateral infarct;  b. 09/2011 MV: large dense inferolat/lat scar - ? very slight peri-infarcrt isch.  No signif change c/w 09/2010 scan.  . Gout   . Hard of hearing    bilaterally  . Hyperlipidemia   . Hypertension   . Hypothyroidism   . Mitral insufficiency    chronically moderate to severe  . Noncompliance with medication regimen   . Persistent atrial fibrillation (HCC)   . PTSD (post-traumatic stress disorder)    "being treated for it now but not a big degree"  . Raynaud's disease /phenomenon    Social History   Social History  . Marital status: Widowed    Spouse name: N/A  . Number of children: 8  . Years of education: N/A   Occupational History  . Chief Technology Officer Retired   Social History Main Topics  .  Smoking status: Former Smoker    Packs/day: 1.50    Years: 20.00    Quit date: 12/12/1970  . Smokeless tobacco: Never Used  . Alcohol use No  . Drug use: No  . Sexual activity: No   Other Topics Concern  . None   Social History Narrative   [place]ives in [place] - currently by himself as his wife is in a nsg home.  He is active and visits his wife multiple x/day.   Family History  Problem Relation Age of Onset  . Heart attack Brother   . Other Mother     died in her late 2780's - old age.  . Alcohol abuse Father     died in his late 2450's   Scheduled Meds: . chlorhexidine  15 mL Mouth Rinse BID  . mouth rinse  15 mL Mouth Rinse q12n4p   Continuous Infusions: . sodium chloride     PRN Meds:.[DISCONTINUED] acetaminophen **OR** acetaminophen  (TYLENOL) oral liquid 160 mg/5 mL **OR** acetaminophen, acetaminophen **OR** acetaminophen, albuterol, antiseptic oral rinse, glycopyrrolate **OR** glycopyrrolate **OR** glycopyrrolate, haloperidol **OR** haloperidol **OR** haloperidol lactate, morphine CONCENTRATE **OR** morphine CONCENTRATE, ondansetron **OR** ondansetron (ZOFRAN) IV, polyvinyl alcohol Medications Prior to Admission:  Prior to Admission medications   Medication Sig Start Date End Date Taking? Authorizing Provider  allopurinol (ZYLOPRIM) 300 MG tablet Take 450 mg by mouth daily.    Yes Historical Provider, MD  apixaban (ELIQUIS) 2.5 MG TABS tablet Take 1 tablet (2.5 mg total) by mouth 2 (two) times daily. 08/12/14  Yes Peter M SwazilandJordan, MD  Cholecalciferol (VITAMIN D-3) 1000 UNITS CAPS Take 1,000 Units by mouth 2 (two) times daily.    Yes Historical Provider, MD  finasteride (PROSCAR) 5 MG tablet Take 5 mg by mouth every evening.    Yes Historical Provider, MD  furosemide (LASIX) 20 MG tablet Take 2 tablets (40 mg total) by mouth daily. 09/02/15  Yes Calvert CantorSaima Rizwan, MD  isosorbide mononitrate (IMDUR) 60 MG 24 hr tablet TAKE 1 TABLET BY MOUTH EVERY DAY 06/03/16  Yes Peter M Swazil[place]Jordan, MD  levothyroxine (SYNTHROID, LEVOTHROID) 88 MCG tablet Take 88 mcg by mouth daily. 07/19/16  Yes Historical Provider, MD  losartan (COZAAR) 50 MG tablet Take 50 mg by mouth daily. 05/23/16  Yes Historical Provider, MD  nitroGLYCERIN (NITROSTAT) 0.4 MG SL tablet Place 1 tablet (0.4 mg total) under the tongue every 5 (five) minutes as needed. x3 doses as needed for chest pain 10/18/12  Yes Ok Anishristopher R Berge, NP  omeprazole (PRILOSEC) 20 MG capsule Take 20 mg by mouth daily.    Yes Historical Provider, MD  ranitidine (ZANTAC) 300 MG capsule Take 300 mg by mouth every evening.   Yes Historical Provider, MD   Allergies  Allergen Reactions  . Zocor [Simvastatin] Other (See Comments)    Unknown reaction per pt   Review of Systems  Physical Exam  Vital Signs:  BP 131/64 (BP Location: Right Arm)   Pulse 66   Temp 97.1 F (36.2 C) (Rectal)   Resp (!) 24   Ht 6' (1.829 m)   Wt 78.9 kg (174 lb)   SpO2 100%   BMI 23.60 kg/m  Pain Assessment: Faces   Pain Score: Asleep   SpO2: SpO2: 100 % O2 Device:SpO2: 100 % O2 Flow Rate: .O2 Flow Rate (L/min): 2 L/min  IO: Intake/output summary:  Intake/Output Summary (Last 24 hours) at 08/19/16 1335 Last data filed at 08/19/16 1208  Gross per 24 hour  Intake  883.2 ml  Output             1250 ml  Net           -366.8 ml    LBM: Last BM Date: 08/19/16 Baseline Weight: Weight: 78.9 kg (174 lb) Most recent weight: Weight: 78.9 kg (174 lb)     Palliative Assessment/Data:   Flowsheet Rows   Flowsheet Row Most Recent Value  Intake Tab  Referral Department  Hospitalist  Unit at Time of Referral  Med/Surg Unit  Palliative Care Primary Diagnosis  Cardiac  Date Notified  08/17/16  Palliative Care Type  New Palliative care  Reason for referral  Clarify Goals of Care  Date of Admission  08/14/16  # of days IP prior to Palliative referral  3  Clinical Assessment  Psychosocial & Spiritual Assessment  Palliative Care Outcomes      Time Total: 50 min Greater than 50%  of this time was spent counseling and coordinating care related to the above assessment and plan.  Signed by: Anderson Malta, DO   Please contact Palliative Medicine Team phone at [phone] for questions and concerns.  For individual provider: See Loretha Stapler

## 2016-08-19 NOTE — Progress Notes (Signed)
Nutrition Brief Note  Patient identified due to a low Braden Score.   Per chart, palliative care have discussed with family and decision was made for comfort measures. Labs and medications reviewed. No nutrition interventions warranted at this time. If nutrition needs arise, please consult RD.   Dorothea Ogleeanne Joselynne Killam RD, CSP, LDN Inpatient Clinical Dietitian Pager: [phone] After Hours Pager: [phone]

## 2016-08-19 NOTE — Progress Notes (Signed)
Palliative Medicine consult noted. Due to high referral volume, there may be a delay seeing this patient. Please call the Palliative Medicine Team office at [phone] if recommendations are needed in the interim.  Thank you for inviting us to see this patient.  Margret ChanceMelanie G. Jasiel Belisle, RN, BSN, [facility] 08/19/2016 9:12 AM Cell [phone] 8:00-4:00 Monday-Friday Office [phone]

## 2016-08-20 LAB — CULTURE, BLOOD (ROUTINE X 2)
CULTURE: NO GROWTH
Culture: NO GROWTH

## 2016-08-20 MED ORDER — WHITE PETROLATUM GEL
Status: AC
  Administered 2016-08-20: 20:00:00
  Filled 2016-08-20: qty 1

## 2016-08-20 NOTE — Progress Notes (Signed)
PROGRESS NOTE [facility]   Craig [name]   ZOX:[id number] DOB: 08-Oct-1918  DOA: 08/14/2016 PCP: Lolita PatellaEADE,ROBERT ALEXANDER, MD   Brief Narrative:  83 year old male with past medical history of hypertension, hypothyroidism, CAD, CKD stage III, systolic CHF, AV block status post PPM presented today the after being found by his daughter with AMS and generalized weakness. In the ED CT head negative for acute intracranial abnormalities but MRI could not be done because of pacemaker. Chest x-ray show bilateral basilar opacity. Patient admitted for stroke workup and antibiotics for possible aspiration pneumonia.  I discussed patient with daughter, she reported that patient has been weak for about a week or so with decrease on oral intake. Patient was having difficulty swallowing and one day prior to admission he could not walk at all.  Patient was treated with antibiotics with minimal to no response, family had meeting with palliative care an decision to made patient comfort care was made.   Subjective: Continues to be alert and awake, but confuse, responds to simple commands nodding his head, otherwise non verbal. Speech therapy evaluated again for comfort feeds but patient aspirate easily.   Assessment & Plan: - Comfort Care   Acute encephalopathy - doubt stroke giving generalized weakness - initial CT with no acute abnormalities - Comfort care - patient would benefit from hospice facility  -Repeated CT negative for stroke  -No MRI due to pacemaker -Encephalopathy could be secondary to patient pneumonia noted on chest x-ray - daughter report difficulty swallowing and eating the previous week - likely causing aspiration  -Deconditioning, worsening in dementia vs natural decline in cognition -Neurology recommendations appreciated they have signed off   Aspiration pneumonia - difficulty swallowing  -Completed 5 days of Zosyn with minimal improvement - continues to have difficulty with  swallow  -Ongoing swallow daily eval - please evaluated for comfort feeds -Follow-up cultures - negative    Chronic systolic CHF - CXR show signs of fluid overload  Lasix d/ed 08/18/10 Comfort measures only   Elevated trop and hx of CAD Likely demand ischemia Cardiology recommendations appreciated - given advanced patient functional status no further workup  Hypertension - stable Monitor BP  Atrial Flutter/Fib - heart rate well controlled, on home Eliquis, CHADVASC 4 Given currently nothing by mouth Eliquis is on hold IV heparin not indicated at this time  AcoCKD (chronic kidney disease), stage III: - Iikely due to Lasix  Despite hydration Cr continues to worsen  Comfort measures   Discussed comfort care with family, they in agreement of hospice facility. No more blood draws.   DVT prophylaxis: Sq Heparin  Code Status: DNR Family Communication: Discussed with daughter at bedside Disposition Plan: Hospice    Consultants:   Neurology   Cardiology   Procedures:   ECHO   Carotid Doppler   Antimicrobials:  Zosyn 08/14/16   Objective: Vitals:   08/19/16 1002 08/19/16 1209 08/19/16 1837 08/20/16 0817  BP: 131/64  125/75 (!) 147/85  Pulse: 66  (!) 56 62  Resp: (!) 24  20   Temp:  97.1 F (36.2 C)    TempSrc:  Rectal    SpO2: 100%  100% 97%  Weight:      Height:        Intake/Output Summary (Last 24 hours) at 08/20/16 1359 Last data filed at 08/20/16 0800  Gross per 24 hour  Intake                0 ml  Output  300 ml  Net             -300 ml   Filed Weights   08/14/16 2009  Weight: 78.9 kg (174 lb)    Examination:  General exam: Frail, alert respond to name  Respiratory system: Good air entry Cardiovascular system: S1S2 Irr Irr S4 4/6 SM No JVD, rubs or gallops Central nervous system: Non verbal, follow some commands.  Extremities: No pedal edema  Data Reviewed: I have personally reviewed following labs and imaging  studies  CBC:  Recent Labs Lab 08/14/16 2102 08/14/16 2116 08/16/16 0354 08/17/16 0209 08/18/16 0427  WBC 7.1  --  9.0 7.8 9.8  NEUTROABS 4.9  --  6.2  --   --   HGB 14.3 15.3 14.4 15.2 15.4  HCT 40.1 45.0 41.5 43.4 44.3  MCV 82.9  --  82.8 81.7 84.7  PLT 195  --  178 156 165   Basic Metabolic Panel:  Recent Labs Lab 08/14/16 2102 08/14/16 2116 08/16/16 0354 08/17/16 0526 08/18/16 0427  NA 136 138 139 141 146*  K 3.9 3.9 3.9 3.8 4.0  CL 103 103 105 107 109  CO2 23  --  19* 20* 21*  GLUCOSE 112* 107* 122* 98 90  BUN 19 23* 21* 22* 29*  CREATININE 1.41* 1.30* 1.61* 2.00* 2.55*  CALCIUM 9.8  --  9.6 9.6 9.7   GFR: Estimated Creatinine Clearance: 19 mL/min (by C-G formula based on SCr of 2.55 mg/dL (H)). Liver Function Tests:  Recent Labs Lab 08/14/16 2102  AST 27  ALT 22  ALKPHOS 113  BILITOT 0.6  PROT 7.3  ALBUMIN 3.8   No results for input(s): LIPASE, AMYLASE in the last 168 hours. No results for input(s): AMMONIA in the last 168 hours. Coagulation Profile:  Recent Labs Lab 08/14/16 2102  INR 1.23   Cardiac Enzymes:  Recent Labs Lab 08/14/16 0018 08/14/16 2106 08/15/16 0447 08/15/16 1141  CKTOTAL  --  77  --   --   TROPONINI 0.12*  --  0.12* 0.13*   BNP (last 3 results) No results for input(s): PROBNP in the last 8760 hours. HbA1C: No results for input(s): HGBA1C in the last 72 hours. CBG:  Recent Labs Lab 08/14/16 2008  GLUCAP 110*   Lipid Profile: No results for input(s): CHOL, HDL, LDLCALC, TRIG, CHOLHDL, LDLDIRECT in the last 72 hours. Thyroid Function Tests: No results for input(s): TSH, T4TOTAL, FREET4, T3FREE, THYROIDAB in the last 72 hours. Anemia Panel: No results for input(s): VITAMINB12, FOLATE, FERRITIN, TIBC, IRON, RETICCTPCT in the last 72 hours. Sepsis Labs: No results for input(s): PROCALCITON, LATICACIDVEN in the last 168 hours.  Recent Results (from the past 240 hour(s))  Culture, blood (routine x 2) Call MD  if unable to obtain prior to antibiotics being given     Status: None (Preliminary result)   Collection Time: 08/14/16 11:50 PM  Result Value Ref Range Status   Specimen Description BLOOD LEFT HAND  Final   Special Requests   Final    BOTTLES DRAWN AEROBIC AND ANAEROBIC 7CC AER,5CC ANA   Culture NO GROWTH 4 DAYS  Final   Report Status PENDING  Incomplete  Culture, blood (routine x 2) Call MD if unable to obtain prior to antibiotics being given     Status: None (Preliminary result)   Collection Time: 08/14/16 11:58 PM  Result Value Ref Range Status   Specimen Description BLOOD RIGHT ARM  Final   Special Requests BOTTLES DRAWN AEROBIC  ONLY 5CC  Final   Culture NO GROWTH 4 DAYS  Final   Report Status PENDING  Incomplete      Radiology Studies: No results found.  Scheduled Meds: . chlorhexidine  15 mL Mouth Rinse BID  . mouth rinse  15 mL Mouth Rinse q12n4p   Continuous Infusions:    LOS: 5 days    Latrelle Dodrill, MD [facility] Pager [phone]  If 7PM-7AM, please contact night-coverage [email or web address] Password [email or web address] 08/20/2016, 1:59 PM

## 2016-08-20 NOTE — Clinical Social Work Note (Signed)
Clinical Social Worker continuing to follow patient and family for support and discharge planning needs.  CSW spoke with patient daughter over the phone who expressed interest in [facility] and [facility].  CSW contacted both facilities and there is currently no bed availability.  Patient daughter updated and agreed to SNF with Hospice following at [facility] - however no available beds at [facility] today.  Patient daughter states that she is hopeful something will open up overnight and if not she is willing to pursue other residential facilities or SNF's for discharge.  CSW remains available for support and to facilitate patient discharge needs once medically stable.  Craig [name], [place] [phone]

## 2016-08-20 NOTE — Progress Notes (Signed)
Speech Language Pathology Treatment: Dysphagia  Patient Details Name: Craig [name] MRN: [id number] DOB: April 02, 1919 Today's Date: 08/20/2016 Time: 1100-1110 SLP Time Calculation (min) (ACUTE ONLY): 10 min  Assessment / Plan / Recommendation Clinical Impression  Skilled treatment session requested by MD for help in determining which food/liquid items would be safest to allow pt for comfort measures. Oral care provided. Ice chip placed in pt's mouth with Max A multimodal cues given by SLP. Pt with no oral responses to ice chip. As the ice chip melted (briefly) no with mild gurgle and coughing. Remaining part of ice chip removed. No hyolaryngeal movement felt at neck. Education given to MD that SLP doesn't have any PO recommendations at this time and continues to recommend NPO given decreased evidenced of swallow initiation. ST to sign off, MD agreeable.    HPI HPI: 83 y.o. male with medical history significant of hypertension, hyperlipidemia, asthma, hypothyroidism, gout, pacemaker placement, mitral valve insufficiency, CAD, PTSD, chronic kidney disease-stage III, systolic congestive heart failure with EF for 45-50 percent, atrial fibrillation on Eliquis, BPH, shingles, who presents with AMS, poor balance and weakness, cough      SLP Plan  Discharge SLP treatment due to (comment) (MD will request comfort food)     Recommendations  Diet recommendations: NPO Medication Administration: Via alternative means                Oral Care Recommendations: Oral care QID Follow up Recommendations: Skilled Nursing facility Plan: Discharge SLP treatment due to (comment) (MD will request comfort food)       GO             Craig [name] B. Dreama Saaverton, M.S., CCC-SLP Speech-Language Pathologist    Makayla Confer 08/20/2016, 11:15 AM

## 2016-08-20 NOTE — Care Management Note (Signed)
Case Management Note  Patient Details  Name: Craig [name] MRN: [id number] Date of Birth: [birth year]  Subjective/Objective:                    Action/Plan: Plan is for residential hospice. CSW aware. CM following.   Expected Discharge Date:                  Expected Discharge Plan:  Hospice Medical Facility  In-House Referral:  Clinical Social Work  Discharge planning Services     Post Acute Care Choice:    Choice offered to:     DME Arranged:    DME Agency:     HH Arranged:    HH Agency:     Status of Service:  In process, will continue to follow  If discussed at Long Length of Stay Meetings, dates discussed:    Additional Comments:  Kermit BaloKelli F Martika Egler, RN 08/20/2016, 12:47 PM

## 2016-08-21 DIAGNOSIS — J690 Pneumonitis due to inhalation of food and vomit: Principal | ICD-10-CM

## 2016-08-21 DIAGNOSIS — N183 Chronic kidney disease, stage 3 (moderate): Secondary | ICD-10-CM

## 2016-08-21 DIAGNOSIS — I2583 Coronary atherosclerosis due to lipid rich plaque: Secondary | ICD-10-CM

## 2016-08-21 DIAGNOSIS — I2510 Atherosclerotic heart disease of native coronary artery without angina pectoris: Secondary | ICD-10-CM

## 2016-08-21 MED ORDER — ONDANSETRON 4 MG PO TBDP: 4.0000 mg | ORAL_TABLET | Freq: Four times a day (QID) | ORAL | 0 refills | Status: AC | PRN

## 2016-08-21 MED ORDER — MORPHINE SULFATE (CONCENTRATE) 10 MG/0.5ML PO SOLN: 5.0000 mg | ORAL | 0 refills | Status: AC | PRN

## 2016-08-21 MED ORDER — HALOPERIDOL 0.5 MG PO TABS: 0.5000 mg | ORAL_TABLET | ORAL | 0 refills | Status: AC | PRN

## 2016-08-21 MED ORDER — ALBUTEROL SULFATE (2.5 MG/3ML) 0.083% IN NEBU: 2.5000 mg | INHALATION_SOLUTION | Freq: Four times a day (QID) | RESPIRATORY_TRACT | 12 refills | Status: AC | PRN

## 2016-08-21 MED ORDER — GLYCOPYRROLATE 1 MG PO TABS: 1.0000 mg | ORAL_TABLET | ORAL | Status: AC | PRN

## 2016-08-21 MED ORDER — ACETAMINOPHEN 325 MG PO TABS: 650.0000 mg | ORAL_TABLET | Freq: Four times a day (QID) | ORAL | Status: AC | PRN

## 2016-08-21 NOTE — Progress Notes (Signed)
[facility]                                                                              Patient Demographics  Craig [name], is a 83 y.o. male, DOB - 06-Dec-1918, ZOX:[id number]  Admit date - 08/14/2016   Admitting Physician Lorretta HarpXilin Niu, MD  Outpatient Primary MD for the patient is Lolita PatellaEADE,ROBERT ALEXANDER, MD  Outpatient specialists:   LOS - 6  days    Chief Complaint  Patient presents with  . Altered Mental Status       Brief summary   83 year old male with past medical history of hypertension, hypothyroidism, CAD, CKD stage III, systolic CHF, AV block status post PPM presented today the after being found by his daughter with AMS and generalized weakness. In the ED CT head negative for acute intracranial abnormalities but MRI could not be done because of pacemaker. Chest x-ray show bilateral basilar opacity. Patient was admitted for stroke workup and antibiotics for possible aspiration pneumonia.   Assessment & Plan    Principal Problem: Acute encephalopathy - doubt stroke giving generalized weakness - initial CT with no acute abnormalities - Comfort care - patient would benefit from hospice facility  -Repeated CT negative for stroke  -No MRI due to pacemaker -Encephalopathy could be secondary to patient pneumonia noted on chest x-ray - daughter reported difficulty swallowing and eating the previous week - likely may have caused aspiration  - Patient may also have worsening in dementia or  natural decline in cognition  Aspiration pneumonia - difficulty swallowing  -Completed 5 days of Zosyn with minimal improvement, continues to have difficulty with swallowing. Speech therapy recommended comfort feeds. -Blood cultures final showed no growth reported  Chronic systolic CHF - CXR show signs of fluid overload  Initially placed on Lasix but discontinued on 1/14, currently on comfort measures only.  Elevated troponins with history of CAD Likely demand  ischemia Cardiology recommendations appreciated - given advanced dementia and poor functional status, no further workup is recommended at this time   Hypertension - stable  Atrial Flutter/Fib - heart rate well controlled, on home Eliquis, CHADVASC 4 Currently comfort care, Eliquis is on hold  AcoCKD (chronic kidney disease), stage III: - Iikely due to Lasix  Despite hydration, Cr continued to worsen   Palliative medicine was consulted and patient's family is in agreement with hospice facility.   Code Status: DNR DVT Prophylaxis:  Comfort care Family Communication: No family member at the bedside   Disposition Plan: Awaiting hospice facility  Time Spent in minutes   15 minutes  Procedures:    Consultants:   Palliative medicine Neurology Cardiology   Antimicrobials:      Medications  Scheduled Meds: . chlorhexidine  15 mL Mouth Rinse BID  . mouth rinse  15 mL Mouth Rinse q12n4p   Continuous Infusions: PRN Meds:.[DISCONTINUED] acetaminophen **OR** acetaminophen (TYLENOL) oral liquid 160 mg/5 mL **OR** acetaminophen, acetaminophen **OR** acetaminophen, albuterol, antiseptic oral rinse, glycopyrrolate **OR** glycopyrrolate **OR** glycopyrrolate, haloperidol **OR** haloperidol **OR** haloperidol lactate, morphine CONCENTRATE **OR** morphine CONCENTRATE, ondansetron **OR** ondansetron (ZOFRAN) IV, polyvinyl alcohol   Antibiotics   Anti-infectives    Start  Dose/Rate Route Frequency Ordered Stop   08/18/16 1400  piperacillin-tazobactam (ZOSYN) IVPB 2.25 g  Status:  Discontinued     2.25 g 100 mL/hr over 30 Minutes Intravenous Every 8 hours 08/18/16 1108 08/19/16 1334   08/15/16 0800  piperacillin-tazobactam (ZOSYN) IVPB 3.375 g  Status:  Discontinued     3.375 g 12.5 mL/hr over 240 Minutes Intravenous Every 8 hours 08/14/16 2324 08/18/16 1107   08/14/16 2330  piperacillin-tazobactam (ZOSYN) IVPB 3.375 g     3.375 g 100 mL/hr over 30 Minutes Intravenous STAT  08/14/16 2324 08/15/16 0226        Subjective:   Terell Kincy was seen and examined today.  Unable to obtain any review of systems from the patient due to advanced dementia  Objective:   Vitals:   08/19/16 1837 08/20/16 0817 08/21/16 0117 08/21/16 0933  BP: 125/75 (!) 147/85 123/80 125/86  Pulse: (!) 56 62 63 65  Resp: 20  16 (!) 22  Temp:   97.3 F (36.3 C) 97.1 F (36.2 C)  TempSrc:   Axillary Axillary  SpO2: 100% 97% 96% 99%  Weight:      Height:        Intake/Output Summary (Last 24 hours) at 08/21/16 1223 Last data filed at 08/20/16 1850  Gross per 24 hour  Intake                0 ml  Output              300 ml  Net             -300 ml     Wt Readings from Last 3 Encounters:  08/14/16 78.9 kg (174 lb)  04/11/16 78.6 kg (173 lb 3.2 oz)  01/04/16 80.3 kg (177 lb)     Exam  General: Alert andAwake, tracking with his eyes, appears comfortable, nonverbal  HEENT:    Neck:   Cardiovascular: S1 S2 auscultated, 4/6 systolic murmur   Respiratory: Clear to auscultation bilaterally, no wheezing, rales or rhonchi  Gastrointestinal: Soft, nontender, nondistended, + bowel sounds  Ext: no cyanosis clubbing or edema  Neuro: did not cooperate  Skin: No rashes  Psych: comfortable   Data Reviewed:  I have personally reviewed following labs and imaging studies  Micro Results Recent Results (from the past 240 hour(s))  Culture, blood (routine x 2) Call MD if unable to obtain prior to antibiotics being given     Status: Craig [name]   Collection Time: 08/14/16 11:50 PM  Result Value Ref Range Status   Specimen Description BLOOD LEFT HAND  Final   Special Requests   Final    BOTTLES DRAWN AEROBIC AND ANAEROBIC 7CC AER,5CC ANA   Culture NO GROWTH 5 DAYS  Final   Report Status 08/20/2016 FINAL  Final  Culture, blood (routine x 2) Call MD if unable to obtain prior to antibiotics being given     Status: Craig [name]   Collection Time: 08/14/16 11:58 PM  Result Value Ref  Range Status   Specimen Description BLOOD RIGHT ARM  Final   Special Requests BOTTLES DRAWN AEROBIC ONLY 5CC  Final   Culture NO GROWTH 5 DAYS  Final   Report Status 08/20/2016 FINAL  Final    Radiology Reports Ct Head Wo Contrast  Result Date: 08/16/2016 CLINICAL DATA:  Stroke with sleepiness this morning. EXAM: CT HEAD WITHOUT CONTRAST TECHNIQUE: Contiguous axial images were obtained from the base of the skull through the vertex without intravenous contrast. COMPARISON:  08/14/2016 FINDINGS: Brain: No evidence of acute infarction, hemorrhage, hydrocephalus, extra-axial collection or mass lesion/mass effect. Atrophy, especially prominent in the mesial temporal lobes. In the setting of dementia, this pattern is seen with Alzheimer's disease. Chronic microvascular disease with ischemic gliosis symmetric in the bilateral cerebral white matter. Vascular: No hyperdense vessel.  Atherosclerotic calcification. Skull: No acute or aggressive finding. Sinuses/Orbits: Bilateral cataract resection.  No acute finding. IMPRESSION: 1. No acute finding or change from 2 days prior. 2. Atrophy as described above.  Chronic microvascular disease. Electronically Signed   By: Marnee Spring M.D.   On: 08/16/2016 08:36   Ct Head Wo Contrast  Result Date: 08/14/2016 CLINICAL DATA:  Stroke-like symptoms beginning 3 days ago. Lethargy and speech disturbance. History of dementia. EXAM: CT HEAD WITHOUT CONTRAST TECHNIQUE: Contiguous axial images were obtained from the base of the skull through the vertex without intravenous contrast. COMPARISON:  02/13/2015 FINDINGS: Brain: There is no evidence of acute cortical infarct, intracranial hemorrhage, mass, midline shift, or extra-axial fluid collection. Moderate cerebral atrophy is unchanged. Periventricular white matter hypodensities are unchanged and nonspecific but compatible with chronic small vessel ischemic disease, mild for age. Vascular: Calcified atherosclerosis at the  skullbase. No hyperdense vessel. Skull: No fracture or suspicious osseous lesion. Sinuses/Orbits: Partially visualized chronic right maxillary sinusitis. Clear mastoid air cells. No acute abnormality in the imaged portions of the orbits. Other: Scattered tiny locules of gas in the cavernous sinuses and infratemporal fossae, possibly related to recent venipuncture. IMPRESSION: 1. No evidence of acute intracranial abnormality. 2. Unchanged cerebral atrophy and chronic small vessel ischemia. Electronically Signed   By: Sebastian Ache M.D.   On: 08/14/2016 20:35   Dg Chest Port 1 View  Result Date: 08/16/2016 CLINICAL DATA:  Aspiration pneumonia. EXAM: PORTABLE CHEST 1 VIEW COMPARISON:  08/14/2016. FINDINGS: Cardiac pacer with lead tip projected right ventricle. Cardiomegaly with pulmonary vascular prominence and mild bilateral interstitial prominence. Small bilateral effusions. Findings consistent with CHF. IMPRESSION: 1. Cardiac pacer with lead tip over the right ventricle. 2. Congestive heart failure with mild bilateral pulmonary interstitial edema. Pneumonitis cannot be excluded. Small bilateral pleural effusions. These findings have increased slightly from prior exam. Electronically Signed   By: Maisie Fus  Register   On: 08/16/2016 07:45   Dg Chest Port 1 View  Result Date: 08/14/2016 CLINICAL DATA:  Altered mental status EXAM: PORTABLE CHEST 1 VIEW COMPARISON:  06/19/2015 FINDINGS: Left-sided single lead pacing device with lead over the right ventricle. Mild left greater than right bibasilar opacities, atelectasis versus mild infiltrates. Stable cardiomegaly with atherosclerosis. No pneumothorax. IMPRESSION: Mild left greater than right bibasilar opacities suspicious for infiltrates. Stable mild cardiomegaly Electronically Signed   By: Jasmine Pang M.D.   On: 08/14/2016 21:17    Lab Data:  CBC:  Recent Labs Lab 08/14/16 2102 08/14/16 2116 08/16/16 0354 08/17/16 0209 08/18/16 0427  WBC 7.1  --   9.0 7.8 9.8  NEUTROABS 4.9  --  6.2  --   --   HGB 14.3 15.3 14.4 15.2 15.4  HCT 40.1 45.0 41.5 43.4 44.3  MCV 82.9  --  82.8 81.7 84.7  PLT 195  --  178 156 165   Basic Metabolic Panel:  Recent Labs Lab 08/14/16 2102 08/14/16 2116 08/16/16 0354 08/17/16 0526 08/18/16 0427  NA 136 138 139 141 146*  K 3.9 3.9 3.9 3.8 4.0  CL 103 103 105 107 109  CO2 23  --  19* 20* 21*  GLUCOSE 112* 107* 122* 98 90  BUN 19 23* 21* 22* 29*  CREATININE 1.41* 1.30* 1.61* 2.00* 2.55*  CALCIUM 9.8  --  9.6 9.6 9.7   GFR: Estimated Creatinine Clearance: 19 mL/min (by C-G formula based on SCr of 2.55 mg/dL (H)). Liver Function Tests:  Recent Labs Lab 08/14/16 2102  AST 27  ALT 22  ALKPHOS 113  BILITOT 0.6  PROT 7.3  ALBUMIN 3.8   No results for input(s): LIPASE, AMYLASE in the last 168 hours. No results for input(s): AMMONIA in the last 168 hours. Coagulation Profile:  Recent Labs Lab 08/14/16 2102  INR 1.23   Cardiac Enzymes:  Recent Labs Lab 08/14/16 2106 08/15/16 0447 08/15/16 1141  CKTOTAL 77  --   --   TROPONINI  --  0.12* 0.13*   BNP (last 3 results) No results for input(s): PROBNP in the last 8760 hours. HbA1C: No results for input(s): HGBA1C in the last 72 hours. CBG:  Recent Labs Lab 08/14/16 2008  GLUCAP 110*   Lipid Profile: No results for input(s): CHOL, HDL, LDLCALC, TRIG, CHOLHDL, LDLDIRECT in the last 72 hours. Thyroid Function Tests: No results for input(s): TSH, T4TOTAL, FREET4, T3FREE, THYROIDAB in the last 72 hours. Anemia Panel: No results for input(s): VITAMINB12, FOLATE, FERRITIN, TIBC, IRON, RETICCTPCT in the last 72 hours. Urine analysis:    Component Value Date/Time   COLORURINE YELLOW 08/14/2016 2145   APPEARANCEUR CLEAR 08/14/2016 2145   LABSPEC 1.010 08/14/2016 2145   PHURINE 6.0 08/14/2016 2145   GLUCOSEU NEGATIVE 08/14/2016 2145   HGBUR NEGATIVE 08/14/2016 2145   BILIRUBINUR NEGATIVE 08/14/2016 2145   KETONESUR NEGATIVE  08/14/2016 2145   PROTEINUR NEGATIVE 08/14/2016 2145   UROBILINOGEN 1.0 02/13/2015 2248   NITRITE NEGATIVE 08/14/2016 2145   LEUKOCYTESUR NEGATIVE 08/14/2016 2145     Evelio Rueda M.D. [facility] 08/21/2016, 12:23 PM  Pager: [phone] Between 7am to 7pm - call Pager - [phone]  After 7pm go to [email or web address] - password [email or web address]  Call night coverage person covering after 7pm

## 2016-08-22 DIAGNOSIS — I5022 Chronic systolic (congestive) heart failure: Secondary | ICD-10-CM

## 2016-08-22 NOTE — Progress Notes (Signed)
No beds at [facility] or [facility] today.  CSW spoke with [facility] who is also unable to offer a bed today.  CSW updated pt dtr, [name], and provided with other SNF options- she will discuss with other family members and call CSW back concerning these options.  CSW will continue to follow  Burna SisJenna H. Edin Kon, LCSW Clinical Social Worker [phone]

## 2016-08-22 NOTE — Progress Notes (Signed)
[facility]                                                                              Patient Demographics  Craig [name], is a 83 y.o. male, DOB - Dec 11, 1918, ZOX:[id number]  Admit date - 08/14/2016   Admitting Physician Lorretta HarpXilin Niu, MD  Outpatient Primary MD for the patient is Lolita PatellaEADE,ROBERT ALEXANDER, MD  Outpatient specialists:   LOS - 7  days    Chief Complaint  Patient presents with  . Altered Mental Status       Brief summary   83 year old male with past medical history of hypertension, hypothyroidism, CAD, CKD stage III, systolic CHF, AV block status post PPM presented today the after being found by his daughter with AMS and generalized weakness. In the ED CT head negative for acute intracranial abnormalities but MRI could not be done because of pacemaker. Chest x-ray show bilateral basilar opacity. Patient was admitted for stroke workup and antibiotics for possible aspiration pneumonia.   Assessment & Plan    Principal Problem: Acute encephalopathy - doubt stroke giving generalized weakness - initial CT with no acute abnormalities - Comfort care - patient would benefit from hospice facility  -Repeated CT negative for stroke  -No MRI due to pacemaker -Encephalopathy could be secondary to patient pneumonia noted on chest x-ray - daughter reported difficulty swallowing and eating in the previous week - likely may have caused aspiration  - Patient may also have worsening in dementia or  natural decline in cognition  Aspiration pneumonia - difficulty swallowing  -Completed 5 days of Zosyn with minimal improvement, continues to have difficulty with swallowing. Speech therapy recommended comfort feeds. -Blood cultures final showed no growth reported  Chronic systolic CHF - CXR show signs of fluid overload  Initially placed on Lasix but discontinued on 1/14, currently on comfort measures only.  Elevated troponins with history of CAD Likely demand  ischemia Cardiology recommendations appreciated - given advanced dementia and poor functional status, no further workup is recommended at this time   Hypertension - stable  Atrial Flutter/Fib - heart rate well controlled, on home Eliquis, CHADVASC 4 Currently comfort care, Eliquis is on hold  AcoCKD (chronic kidney disease), stage III: - Iikely due to Lasix  Despite hydration, Cr continued to worsen   Palliative medicine was consulted and patient's family is in agreement with hospice facility.   Code Status: DNR DVT Prophylaxis:  Comfort care Family Communication: No family member at the bedside   Disposition Plan: Awaiting hospice facility  Time Spent in minutes   15 minutes  Procedures:    Consultants:   Palliative medicine Neurology Cardiology   Antimicrobials:      Medications  Scheduled Meds: . chlorhexidine  15 mL Mouth Rinse BID  . mouth rinse  15 mL Mouth Rinse q12n4p   Continuous Infusions: PRN Meds:.[DISCONTINUED] acetaminophen **OR** acetaminophen (TYLENOL) oral liquid 160 mg/5 mL **OR** acetaminophen, acetaminophen **OR** acetaminophen, albuterol, antiseptic oral rinse, glycopyrrolate **OR** glycopyrrolate **OR** glycopyrrolate, haloperidol **OR** haloperidol **OR** haloperidol lactate, morphine CONCENTRATE **OR** morphine CONCENTRATE, ondansetron **OR** ondansetron (ZOFRAN) IV, polyvinyl alcohol   Antibiotics   Anti-infectives    Start  Dose/Rate Route Frequency Ordered Stop   08/18/16 1400  piperacillin-tazobactam (ZOSYN) IVPB 2.25 g  Status:  Discontinued     2.25 g 100 mL/hr over 30 Minutes Intravenous Every 8 hours 08/18/16 1108 08/19/16 1334   08/15/16 0800  piperacillin-tazobactam (ZOSYN) IVPB 3.375 g  Status:  Discontinued     3.375 g 12.5 mL/hr over 240 Minutes Intravenous Every 8 hours 08/14/16 2324 08/18/16 1107   08/14/16 2330  piperacillin-tazobactam (ZOSYN) IVPB 3.375 g     3.375 g 100 mL/hr over 30 Minutes Intravenous STAT  08/14/16 2324 08/15/16 0226        Subjective:   Harlin Mazzoni was seen and examined today.  Unable to obtain any review of systems from the patient due to advanced dementia. Alert and awake and appears to be comfortable, no acute issues overnight, afebrile.  Objective:   Vitals:   08/20/16 0817 08/21/16 0117 08/21/16 0933 08/21/16 2103  BP: (!) 147/85 123/80 125/86 (!) 141/84  Pulse: 62 63 65 61  Resp:  16 (!) 22 20  Temp:  97.3 F (36.3 C) 97.1 F (36.2 C) 97 F (36.1 C)  TempSrc:  Axillary Axillary Axillary  SpO2: 97% 96% 99% 98%  Weight:      Height:        Intake/Output Summary (Last 24 hours) at 08/22/16 1248 Last data filed at 08/21/16 2305  Gross per 24 hour  Intake                0 ml  Output              250 ml  Net             -250 ml     Wt Readings from Last 3 Encounters:  08/14/16 78.9 kg (174 lb)  04/11/16 78.6 kg (173 lb 3.2 oz)  01/04/16 80.3 kg (177 lb)     Exam  General: Alert andAwake, tracking with his eyes, appears comfortable, nonverbal  HEENT:    Neck:   Cardiovascular: S1 S2 auscultated, 4/6 systolic murmur   Respiratory: CTAB  Gastrointestinal: Soft, nontender, nondistended, + bowel sounds  Ext: no cyanosis clubbing or edema  Neuro:   Skin: No rashes  Psych: comfortable   Data Reviewed:  I have personally reviewed following labs and imaging studies  Micro Results Recent Results (from the past 240 hour(s))  Culture, blood (routine x 2) Call MD if unable to obtain prior to antibiotics being given     Status: None   Collection Time: 08/14/16 11:50 PM  Result Value Ref Range Status   Specimen Description BLOOD LEFT HAND  Final   Special Requests   Final    BOTTLES DRAWN AEROBIC AND ANAEROBIC 7CC AER,5CC ANA   Culture NO GROWTH 5 DAYS  Final   Report Status 08/20/2016 FINAL  Final  Culture, blood (routine x 2) Call MD if unable to obtain prior to antibiotics being given     Status: None   Collection Time: 08/14/16  11:58 PM  Result Value Ref Range Status   Specimen Description BLOOD RIGHT ARM  Final   Special Requests BOTTLES DRAWN AEROBIC ONLY 5CC  Final   Culture NO GROWTH 5 DAYS  Final   Report Status 08/20/2016 FINAL  Final    Radiology Reports Ct Head Wo Contrast  Result Date: 08/16/2016 CLINICAL DATA:  Stroke with sleepiness this morning. EXAM: CT HEAD WITHOUT CONTRAST TECHNIQUE: Contiguous axial images were obtained from the base of the skull through  the vertex without intravenous contrast. COMPARISON:  08/14/2016 FINDINGS: Brain: No evidence of acute infarction, hemorrhage, hydrocephalus, extra-axial collection or mass lesion/mass effect. Atrophy, especially prominent in the mesial temporal lobes. In the setting of dementia, this pattern is seen with Alzheimer's disease. Chronic microvascular disease with ischemic gliosis symmetric in the bilateral cerebral white matter. Vascular: No hyperdense vessel.  Atherosclerotic calcification. Skull: No acute or aggressive finding. Sinuses/Orbits: Bilateral cataract resection.  No acute finding. IMPRESSION: 1. No acute finding or change from 2 days prior. 2. Atrophy as described above.  Chronic microvascular disease. Electronically Signed   By: Marnee Spring M.D.   On: 08/16/2016 08:36   Ct Head Wo Contrast  Result Date: 08/14/2016 CLINICAL DATA:  Stroke-like symptoms beginning 3 days ago. Lethargy and speech disturbance. History of dementia. EXAM: CT HEAD WITHOUT CONTRAST TECHNIQUE: Contiguous axial images were obtained from the base of the skull through the vertex without intravenous contrast. COMPARISON:  02/13/2015 FINDINGS: Brain: There is no evidence of acute cortical infarct, intracranial hemorrhage, mass, midline shift, or extra-axial fluid collection. Moderate cerebral atrophy is unchanged. Periventricular white matter hypodensities are unchanged and nonspecific but compatible with chronic small vessel ischemic disease, mild for age. Vascular: Calcified  atherosclerosis at the skullbase. No hyperdense vessel. Skull: No fracture or suspicious osseous lesion. Sinuses/Orbits: Partially visualized chronic right maxillary sinusitis. Clear mastoid air cells. No acute abnormality in the imaged portions of the orbits. Other: Scattered tiny locules of gas in the cavernous sinuses and infratemporal fossae, possibly related to recent venipuncture. IMPRESSION: 1. No evidence of acute intracranial abnormality. 2. Unchanged cerebral atrophy and chronic small vessel ischemia. Electronically Signed   By: Sebastian Ache M.D.   On: 08/14/2016 20:35   Dg Chest Port 1 View  Result Date: 08/16/2016 CLINICAL DATA:  Aspiration pneumonia. EXAM: PORTABLE CHEST 1 VIEW COMPARISON:  08/14/2016. FINDINGS: Cardiac pacer with lead tip projected right ventricle. Cardiomegaly with pulmonary vascular prominence and mild bilateral interstitial prominence. Small bilateral effusions. Findings consistent with CHF. IMPRESSION: 1. Cardiac pacer with lead tip over the right ventricle. 2. Congestive heart failure with mild bilateral pulmonary interstitial edema. Pneumonitis cannot be excluded. Small bilateral pleural effusions. These findings have increased slightly from prior exam. Electronically Signed   By: Maisie Fus  Register   On: 08/16/2016 07:45   Dg Chest Port 1 View  Result Date: 08/14/2016 CLINICAL DATA:  Altered mental status EXAM: PORTABLE CHEST 1 VIEW COMPARISON:  06/19/2015 FINDINGS: Left-sided single lead pacing device with lead over the right ventricle. Mild left greater than right bibasilar opacities, atelectasis versus mild infiltrates. Stable cardiomegaly with atherosclerosis. No pneumothorax. IMPRESSION: Mild left greater than right bibasilar opacities suspicious for infiltrates. Stable mild cardiomegaly Electronically Signed   By: Jasmine Pang M.D.   On: 08/14/2016 21:17    Lab Data:  CBC:  Recent Labs Lab 08/16/16 0354 08/17/16 0209 08/18/16 0427  WBC 9.0 7.8 9.8    NEUTROABS 6.2  --   --   HGB 14.4 15.2 15.4  HCT 41.5 43.4 44.3  MCV 82.8 81.7 84.7  PLT 178 156 165   Basic Metabolic Panel:  Recent Labs Lab 08/16/16 0354 08/17/16 0526 08/18/16 0427  NA 139 141 146*  K 3.9 3.8 4.0  CL 105 107 109  CO2 19* 20* 21*  GLUCOSE 122* 98 90  BUN 21* 22* 29*  CREATININE 1.61* 2.00* 2.55*  CALCIUM 9.6 9.6 9.7   GFR: Estimated Creatinine Clearance: 19 mL/min (by C-G formula based on SCr of 2.55 mg/dL (  H)). Liver Function Tests: No results for input(s): AST, ALT, ALKPHOS, BILITOT, PROT, ALBUMIN in the last 168 hours. No results for input(s): LIPASE, AMYLASE in the last 168 hours. No results for input(s): AMMONIA in the last 168 hours. Coagulation Profile: No results for input(s): INR, PROTIME in the last 168 hours. Cardiac Enzymes: No results for input(s): CKTOTAL, CKMB, CKMBINDEX, TROPONINI in the last 168 hours. BNP (last 3 results) No results for input(s): PROBNP in the last 8760 hours. HbA1C: No results for input(s): HGBA1C in the last 72 hours. CBG: No results for input(s): GLUCAP in the last 168 hours. Lipid Profile: No results for input(s): CHOL, HDL, LDLCALC, TRIG, CHOLHDL, LDLDIRECT in the last 72 hours. Thyroid Function Tests: No results for input(s): TSH, T4TOTAL, FREET4, T3FREE, THYROIDAB in the last 72 hours. Anemia Panel: No results for input(s): VITAMINB12, FOLATE, FERRITIN, TIBC, IRON, RETICCTPCT in the last 72 hours. Urine analysis:    Component Value Date/Time   COLORURINE YELLOW 08/14/2016 2145   APPEARANCEUR CLEAR 08/14/2016 2145   LABSPEC 1.010 08/14/2016 2145   PHURINE 6.0 08/14/2016 2145   GLUCOSEU NEGATIVE 08/14/2016 2145   HGBUR NEGATIVE 08/14/2016 2145   BILIRUBINUR NEGATIVE 08/14/2016 2145   KETONESUR NEGATIVE 08/14/2016 2145   PROTEINUR NEGATIVE 08/14/2016 2145   UROBILINOGEN 1.0 02/13/2015 2248   NITRITE NEGATIVE 08/14/2016 2145   LEUKOCYTESUR NEGATIVE 08/14/2016 2145     RAI,RIPUDEEP M.D. [facility] 08/22/2016, 12:48 PM  Pager: [phone] Between 7am to 7pm - call Pager - [phone]  After 7pm go to [email or web address] - password [email or web address]  Call night coverage person covering after 7pm

## 2016-08-23 NOTE — Progress Notes (Signed)
[facility]                                                                              Patient Demographics  Craig [name], is a 83 y.o. male, DOB - [birth year], ZOX:[id number]  Admit date - 08/14/2016   Admitting Physician Lorretta Harp, MD  Outpatient Primary MD for the patient is [name], MD  Outpatient specialists:   LOS - 8  days    Chief Complaint  Patient presents with  . Altered Mental Status       Brief summary   83 year old male with past medical history of hypertension, hypothyroidism, CAD, CKD stage III, systolic CHF, AV block status post PPM presented today the after being found by his daughter with AMS and generalized weakness. In the ED CT head negative for acute intracranial abnormalities but MRI could not be done because of pacemaker. Chest x-ray show bilateral basilar opacity. Patient was admitted for stroke workup and antibiotics for possible aspiration pneumonia.   Assessment & Plan    Principal Problem: Acute encephalopathy - doubt stroke giving generalized weakness - initial CT with no acute abnormalities - Comfort care - patient would benefit from hospice facility  -Repeated CT negative for stroke  -No MRI due to pacemaker -Encephalopathy could be secondary to patient pneumonia noted on chest x-ray - daughter reported difficulty swallowing and eating in the previous week - likely may have caused aspiration  - Patient may also have worsening in dementia or  natural decline in cognition  Aspiration pneumonia - difficulty swallowing  -Completed 5 days of Zosyn with minimal improvement, continues to have difficulty with swallowing. Speech therapy recommended comfort feeds. -Blood cultures final showed no growth reported  Chronic systolic CHF - CXR show signs of fluid overload  Initially placed on Lasix but discontinued on 1/14, currently on comfort measures only.  Elevated troponins with history of CAD Likely demand  ischemia Cardiology recommendations appreciated - given advanced dementia and poor functional status, no further workup is recommended at this time   Hypertension - stable  Atrial Flutter/Fib - heart rate well controlled, on home Eliquis, CHADVASC 4 Currently comfort care, Eliquis is on hold  AcoCKD (chronic kidney disease), stage III: - Iikely due to Lasix  Despite hydration, Cr continued to worsen   Palliative medicine was consulted and patient's family is in agreement with hospice facility.   Code Status: DNR DVT Prophylaxis:  Comfort care Family Communication: No family member at the bedside   Disposition Plan: Awaiting hospice facility, no changes   Time Spent in minutes   15 minutes  Procedures:    Consultants:   Palliative medicine Neurology Cardiology   Antimicrobials:      Medications  Scheduled Meds: . chlorhexidine  15 mL Mouth Rinse BID  . mouth rinse  15 mL Mouth Rinse q12n4p   Continuous Infusions: PRN Meds:.[DISCONTINUED] acetaminophen **OR** acetaminophen (TYLENOL) oral liquid 160 mg/5 mL **OR** acetaminophen, acetaminophen **OR** acetaminophen, albuterol, antiseptic oral rinse, glycopyrrolate **OR** glycopyrrolate **OR** glycopyrrolate, haloperidol **OR** haloperidol **OR** haloperidol lactate, morphine CONCENTRATE **OR** morphine CONCENTRATE, ondansetron **OR** ondansetron (ZOFRAN) IV, polyvinyl alcohol   Antibiotics   Anti-infectives  Start     Dose/Rate Route Frequency Ordered Stop   08/18/16 1400  piperacillin-tazobactam (ZOSYN) IVPB 2.25 g  Status:  Discontinued     2.25 g 100 mL/hr over 30 Minutes Intravenous Every 8 hours 08/18/16 1108 08/19/16 1334   08/15/16 0800  piperacillin-tazobactam (ZOSYN) IVPB 3.375 g  Status:  Discontinued     3.375 g 12.5 mL/hr over 240 Minutes Intravenous Every 8 hours 08/14/16 2324 08/18/16 1107   08/14/16 2330  piperacillin-tazobactam (ZOSYN) IVPB 3.375 g     3.375 g 100 mL/hr over 30 Minutes  Intravenous STAT 08/14/16 2324 08/15/16 0226        Subjective:   Tres Grzywacz was seen and examined today. Comfortable, unable to obtain any review of systems from the patient due to advanced dementia.   Objective:   Vitals:   08/21/16 2103 08/22/16 1353 08/22/16 2208 08/23/16 1045  BP: (!) 141/84 (!) 144/85 (!) 141/71 (!) 148/92  Pulse: 61 61 (!) 54 (!) 55  Resp: 20 20 (!) 22 19  Temp: 97 F (36.1 C) 97.6 F (36.4 C)  97.8 F (36.6 C)  TempSrc: Axillary Oral  Axillary  SpO2: 98% 96% 97% 94%  Weight:      Height:        Intake/Output Summary (Last 24 hours) at 08/23/16 1330 Last data filed at 08/23/16 0650  Gross per 24 hour  Intake                0 ml  Output              650 ml  Net             -650 ml     Wt Readings from Last 3 Encounters:  08/14/16 78.9 kg (174 lb)  04/11/16 78.6 kg (173 lb 3.2 oz)  01/04/16 80.3 kg (177 lb)     Exam  General: Alert and Awake, tracking with his eyes, appears comfortable, nonverbal  HEENT:    Neck:   Cardiovascular: S1 S2 auscultated, 4/6 systolic murmur   Respiratory: CTAB  Gastrointestinal: Soft, nontender, nondistended, + bowel sounds  Ext: no cyanosis clubbing or edema  Neuro:   Skin: No rashes  Psych: comfortable   Data Reviewed:  I have personally reviewed following labs and imaging studies  Micro Results Recent Results (from the past 240 hour(s))  Culture, blood (routine x 2) Call MD if unable to obtain prior to antibiotics being given     Status: None   Collection Time: 08/14/16 11:50 PM  Result Value Ref Range Status   Specimen Description BLOOD LEFT HAND  Final   Special Requests   Final    BOTTLES DRAWN AEROBIC AND ANAEROBIC 7CC AER,5CC ANA   Culture NO GROWTH 5 DAYS  Final   Report Status 08/20/2016 FINAL  Final  Culture, blood (routine x 2) Call MD if unable to obtain prior to antibiotics being given     Status: None   Collection Time: 08/14/16 11:58 PM  Result Value Ref Range Status     Specimen Description BLOOD RIGHT ARM  Final   Special Requests BOTTLES DRAWN AEROBIC ONLY 5CC  Final   Culture NO GROWTH 5 DAYS  Final   Report Status 08/20/2016 FINAL  Final    Radiology Reports Ct Head Wo Contrast  Result Date: 08/16/2016 CLINICAL DATA:  Stroke with sleepiness this morning. EXAM: CT HEAD WITHOUT CONTRAST TECHNIQUE: Contiguous axial images were obtained from the base of the skull through the  vertex without intravenous contrast. COMPARISON:  08/14/2016 FINDINGS: Brain: No evidence of acute infarction, hemorrhage, hydrocephalus, extra-axial collection or mass lesion/mass effect. Atrophy, especially prominent in the mesial temporal lobes. In the setting of dementia, this pattern is seen with Alzheimer's disease. Chronic microvascular disease with ischemic gliosis symmetric in the bilateral cerebral white matter. Vascular: No hyperdense vessel.  Atherosclerotic calcification. Skull: No acute or aggressive finding. Sinuses/Orbits: Bilateral cataract resection.  No acute finding. IMPRESSION: 1. No acute finding or change from 2 days prior. 2. Atrophy as described above.  Chronic microvascular disease. Electronically Signed   By: Marnee SpringJonathon  Watts M.D.   On: 08/16/2016 08:36   Ct Head Wo Contrast  Result Date: 08/14/2016 CLINICAL DATA:  Stroke-like symptoms beginning 3 days ago. Lethargy and speech disturbance. History of dementia. EXAM: CT HEAD WITHOUT CONTRAST TECHNIQUE: Contiguous axial images were obtained from the base of the skull through the vertex without intravenous contrast. COMPARISON:  02/13/2015 FINDINGS: Brain: There is no evidence of acute cortical infarct, intracranial hemorrhage, mass, midline shift, or extra-axial fluid collection. Moderate cerebral atrophy is unchanged. Periventricular white matter hypodensities are unchanged and nonspecific but compatible with chronic small vessel ischemic disease, mild for age. Vascular: Calcified atherosclerosis at the skullbase. No  hyperdense vessel. Skull: No fracture or suspicious osseous lesion. Sinuses/Orbits: Partially visualized chronic right maxillary sinusitis. Clear mastoid air cells. No acute abnormality in the imaged portions of the orbits. Other: Scattered tiny locules of gas in the cavernous sinuses and infratemporal fossae, possibly related to recent venipuncture. IMPRESSION: 1. No evidence of acute intracranial abnormality. 2. Unchanged cerebral atrophy and chronic small vessel ischemia. Electronically Signed   By: Sebastian AcheAllen  Grady M.D.   On: 08/14/2016 20:35   Dg Chest Port 1 View  Result Date: 08/16/2016 CLINICAL DATA:  Aspiration pneumonia. EXAM: PORTABLE CHEST 1 VIEW COMPARISON:  08/14/2016. FINDINGS: Cardiac pacer with lead tip projected right ventricle. Cardiomegaly with pulmonary vascular prominence and mild bilateral interstitial prominence. Small bilateral effusions. Findings consistent with CHF. IMPRESSION: 1. Cardiac pacer with lead tip over the right ventricle. 2. Congestive heart failure with mild bilateral pulmonary interstitial edema. Pneumonitis cannot be excluded. Small bilateral pleural effusions. These findings have increased slightly from prior exam. Electronically Signed   By: Maisie Fushomas  Register   On: 08/16/2016 07:45   Dg Chest Port 1 View  Result Date: 08/14/2016 CLINICAL DATA:  Altered mental status EXAM: PORTABLE CHEST 1 VIEW COMPARISON:  06/19/2015 FINDINGS: Left-sided single lead pacing device with lead over the right ventricle. Mild left greater than right bibasilar opacities, atelectasis versus mild infiltrates. Stable cardiomegaly with atherosclerosis. No pneumothorax. IMPRESSION: Mild left greater than right bibasilar opacities suspicious for infiltrates. Stable mild cardiomegaly Electronically Signed   By: Jasmine PangKim  Fujinaga M.D.   On: 08/14/2016 21:17    Lab Data:  CBC:  Recent Labs Lab 08/17/16 0209 08/18/16 0427  WBC 7.8 9.8  HGB 15.2 15.4  HCT 43.4 44.3  MCV 81.7 84.7  PLT 156 165     Basic Metabolic Panel:  Recent Labs Lab 08/17/16 0526 08/18/16 0427  NA 141 146*  K 3.8 4.0  CL 107 109  CO2 20* 21*  GLUCOSE 98 90  BUN 22* 29*  CREATININE 2.00* 2.55*  CALCIUM 9.6 9.7   GFR: Estimated Creatinine Clearance: 19 mL/min (by C-G formula based on SCr of 2.55 mg/dL (H)). Liver Function Tests: No results for input(s): AST, ALT, ALKPHOS, BILITOT, PROT, ALBUMIN in the last 168 hours. No results for input(s): LIPASE, AMYLASE in the  last 168 hours. No results for input(s): AMMONIA in the last 168 hours. Coagulation Profile: No results for input(s): INR, PROTIME in the last 168 hours. Cardiac Enzymes: No results for input(s): CKTOTAL, CKMB, CKMBINDEX, TROPONINI in the last 168 hours. BNP (last 3 results) No results for input(s): PROBNP in the last 8760 hours. HbA1C: No results for input(s): HGBA1C in the last 72 hours. CBG: No results for input(s): GLUCAP in the last 168 hours. Lipid Profile: No results for input(s): CHOL, HDL, LDLCALC, TRIG, CHOLHDL, LDLDIRECT in the last 72 hours. Thyroid Function Tests: No results for input(s): TSH, T4TOTAL, FREET4, T3FREE, THYROIDAB in the last 72 hours. Anemia Panel: No results for input(s): VITAMINB12, FOLATE, FERRITIN, TIBC, IRON, RETICCTPCT in the last 72 hours. Urine analysis:    Component Value Date/Time   COLORURINE YELLOW 08/14/2016 2145   APPEARANCEUR CLEAR 08/14/2016 2145   LABSPEC 1.010 08/14/2016 2145   PHURINE 6.0 08/14/2016 2145   GLUCOSEU NEGATIVE 08/14/2016 2145   HGBUR NEGATIVE 08/14/2016 2145   BILIRUBINUR NEGATIVE 08/14/2016 2145   KETONESUR NEGATIVE 08/14/2016 2145   PROTEINUR NEGATIVE 08/14/2016 2145   UROBILINOGEN 1.0 02/13/2015 2248   NITRITE NEGATIVE 08/14/2016 2145   LEUKOCYTESUR NEGATIVE 08/14/2016 2145     Alyra Patty M.D. [facility] 08/23/2016, 1:30 PM  Pager: [phone] Between 7am to 7pm - call Pager - [phone]  After 7pm go to [email or web address] - password [email or web address]  Call  night coverage person covering after 7pm

## 2016-08-23 NOTE — Care Management Important Message (Signed)
Important Message  Patient Details  Name: Craig [name] MRN: [id number] Date of Birth: 01-30-1919   Medicare Important Message Given:  Yes    Tsuneo Faison 08/23/2016, 1:17 PM

## 2016-08-23 NOTE — Consult Note (Signed)
[facility] Liaison  Received request from CSW Eileen StanfordJenna 08/22/16 for family interest in [facility]. At that time no [facility] availability and that continues to be true. Stopped by room several times today with no family present. [facility] will continue to update CSW with updates re availability.   Thank you,  Forrestine Himva Davis, LCSW [phone]  [facility] hospital liaisons are listed on AMION or call [phone] for assistance.

## 2016-08-24 MED ORDER — MORPHINE SULFATE (PF) 2 MG/ML IV SOLN
1.0000 mg | INTRAVENOUS | Status: DC | PRN
  Administered 2016-08-24: 1 mg via INTRAVENOUS
  Filled 2016-08-24: qty 1

## 2016-08-24 NOTE — Clinical Social Work Placement (Signed)
   CLINICAL SOCIAL WORK PLACEMENT  NOTE  Date:  08/24/2016  Patient Details  Name: Craig [name] R Gallien MRN: [id number] Date of Birth: [birth year]  Clinical Social Work is seeking post-discharge placement for this patient at the Skilled  Nursing Facility level of care (*CSW will initial, date and re-position this form in  chart as items are completed):      Patient/family provided with [facility] Clinical Social Work Department's list of facilities offering this level of care within the geographic area requested by the patient (or if unable, by the patient's family).      Patient/family informed of their freedom to choose among providers that offer the needed level of care, that participate in Medicare, Medicaid or managed care program needed by the patient, have an available bed and are willing to accept the patient.      Patient/family informed of [place]'s ownership interest in [facility] and [facility], as well as of the fact that they are under no obligation to receive care at these facilities.  PASRR submitted to EDS on 08/17/16     PASRR number received on 08/17/16     Existing PASRR number confirmed on       FL2 transmitted to all facilities in geographic area requested by pt/family on 08/17/16     FL2 transmitted to all facilities within larger geographic area on       Patient informed that his/her managed care company has contracts with or will negotiate with certain facilities, including the following:        Yes   Patient/family informed of bed offers received.  [facility]     Physician recommends and patient [facility]    [facility] on 08/24/16.  Patient to be transferred to facility by Ambulance     Patient family notified on 08/24/16 of transfer.  Name of family member notified:  Patient daughter Rosezetta Schlatter- Burnette     PHYSICIAN Please sign FL2, Please sign DNR     Additional  Comment:    Macario GoldsJesse Jocelyn Lowery, LCSW [phone]

## 2016-08-24 NOTE — Progress Notes (Signed)
IV medication for labored breathing will be entered by Dr. Burnadette PeterLynch. RN will continue to monitor.

## 2016-08-24 NOTE — Discharge Summary (Signed)
Physician Discharge Summary   Patient ID: Craig [name] MRN: [id number] DOB/AGE: [birth year] 81 y.o.  Admit date: 08/14/2016 Discharge date: 08/24/2016  Primary Care Physician:  Lolita PatellaEADE,ROBERT ALEXANDER, MD  Discharge Diagnoses:    . Acute encephalopathy . Atrial fibrillation (HCC) . BPH (benign prostatic hyperplasia) . CAD (coronary artery disease) . Chronic systolic CHF (congestive heart failure) (HCC) . Hypertension . Hypothyroidism . Aspiration pneumonia . Elevated troponin . CKD (chronic kidney disease), stage III . Aspiration pneumonia [facility](HCC)   Consults: Palliative Medicine Neurology Cardiology  Recommendations for Outpatient Follow-up:  1. Comfort care   DIET: Comfort food    Allergies:   Allergies  Allergen Reactions  . Zocor [Simvastatin] Other (See Comments)    Unknown reaction per pt     DISCHARGE MEDICATIONS: Current Discharge Medication List    START taking these medications   Details  acetaminophen (TYLENOL) 325 MG tablet Take 2 tablets (650 mg total) by mouth every 6 (six) hours as needed for mild pain (or Fever >/= 101).    albuterol (PROVENTIL) (2.5 MG/3ML) 0.083% nebulizer solution Take 3 mLs (2.5 mg total) by nebulization every 6 (six) hours as needed for wheezing or shortness of breath. Qty: 75 mL, Refills: 12    glycopyrrolate (ROBINUL) 1 MG tablet Take 1 tablet (1 mg total) by mouth every 4 (four) hours as needed (excessive secretions).    haloperidol (HALDOL) 0.5 MG tablet Take 1 tablet (0.5 mg total) by mouth every 4 (four) hours as needed for agitation (or delirium). Qty: 15 tablet, Refills: 0    Morphine Sulfate (MORPHINE CONCENTRATE) 10 MG/0.5ML SOLN concentrated solution Take 0.25 mLs (5 mg total) by mouth every 2 (two) hours as needed for moderate pain (or dyspnea). Qty: 30 mL, Refills: 0    ondansetron (ZOFRAN-ODT) 4 MG disintegrating tablet Take 1 tablet (4 mg total) by mouth every 6 (six) hours as needed for nausea. Qty: 20  tablet, Refills: 0      STOP taking these medications     allopurinol (ZYLOPRIM) 300 MG tablet      apixaban (ELIQUIS) 2.5 MG TABS tablet      Cholecalciferol (VITAMIN D-3) 1000 UNITS CAPS      finasteride (PROSCAR) 5 MG tablet      furosemide (LASIX) 20 MG tablet      isosorbide mononitrate (IMDUR) 60 MG 24 hr tablet      levothyroxine (SYNTHROID, LEVOTHROID) 88 MCG tablet      losartan (COZAAR) 50 MG tablet      nitroGLYCERIN (NITROSTAT) 0.4 MG SL tablet      omeprazole (PRILOSEC) 20 MG capsule      ranitidine (ZANTAC) 300 MG capsule          Brief H and P: For complete details please refer to admission H and P, but in brief 83 year old male with past medical history of hypertension, hypothyroidism, CAD, CKD stage III, systolic CHF, AV block status post PPM presented today the after being found by his daughter with AMS and generalized weakness. In the ED CT head negative for acute intracranial abnormalities but MRI could not be done because of pacemaker. Chest x-ray show bilateral basilar opacity. Patient was admitted for stroke workup and antibiotics for possible aspiration pneumonia.  Hospital Course:  Acute encephalopathy - doubt stroke giving generalized weakness - initial CT with no acute abnormalities - Comfort care  -Repeated CT was negative for stroke, no MRI due to pacemaker -Encephalopathycould be secondary to patient pneumonia noted on chest x-ray - daughter  reported difficulty with swallowing and eating in the previous week, likely may have caused aspiration.  Patient may also have worsening in dementia or  natural decline in cognition.  Aspiration pneumonia - difficulty swallowing  -Completed 5 days of Zosyn with minimal improvement, however continued to have difficulty with swallowing. Speech therapy recommended comfort feeds. -Blood cultures final showed no growth reported  Chronic systolic CHF - CXR show signs of fluid overload  Initially placed on  Lasix but discontinued on 1/14, currently on comfort measures only.  Elevated troponins with history of CAD Likely demand ischemia Cardiology was consulted. Cardiology recommendations appreciated - given advanced dementia and poor functional status, no further workup is recommended at this time   Hypertension - stable  Atrial Flutter/Fib - heart rate well controlled, on home Eliquis, CHADVASC 4 Currently comfort care, Eliquis now discontinued  AcoCKD (chronic kidney disease), stage III: - Iikely due to Lasix  Despite hydration, Cr continued to worsen      Day of Discharge BP 113/76 (BP Location: Right Arm)   Pulse 67   Temp 97.6 F (36.4 C) (Axillary)   Resp 16   Ht 6' (1.829 m)   Wt 78.9 kg (174 lb)   SpO2 100%   BMI 23.60 kg/m   Physical Exam: General: Lethargic, appears comfortable HEENT: anicteric sclera, pupils reactive to light and accommodation CVS: S1-S2 clear no murmur rubs or gallops Chest: Somewhat coarse breath sounds at the bases Abdomen: soft nontender, nondistended, normal bowel sounds Extremities: no cyanosis, clubbing or edema noted bilaterally    The results of significant diagnostics from this hospitalization (including imaging, microbiology, ancillary and laboratory) are listed below for reference.    LAB RESULTS: Basic Metabolic Panel:  Recent Labs Lab 08/18/16 0427  NA 146*  K 4.0  CL 109  CO2 21*  GLUCOSE 90  BUN 29*  CREATININE 2.55*  CALCIUM 9.7   Liver Function Tests: No results for input(s): AST, ALT, ALKPHOS, BILITOT, PROT, ALBUMIN in the last 168 hours. No results for input(s): LIPASE, AMYLASE in the last 168 hours. No results for input(s): AMMONIA in the last 168 hours. CBC:  Recent Labs Lab 08/18/16 0427  WBC 9.8  HGB 15.4  HCT 44.3  MCV 84.7  PLT 165   Cardiac Enzymes: No results for input(s): CKTOTAL, CKMB, CKMBINDEX, TROPONINI in the last 168 hours. BNP: Invalid input(s): POCBNP CBG: No results for  input(s): GLUCAP in the last 168 hours.  Significant Diagnostic Studies:  Ct Head Wo Contrast  Result Date: 08/14/2016 CLINICAL DATA:  Stroke-like symptoms beginning 3 days ago. Lethargy and speech disturbance. History of dementia. EXAM: CT HEAD WITHOUT CONTRAST TECHNIQUE: Contiguous axial images were obtained from the base of the skull through the vertex without intravenous contrast. COMPARISON:  02/13/2015 FINDINGS: Brain: There is no evidence of acute cortical infarct, intracranial hemorrhage, mass, midline shift, or extra-axial fluid collection. Moderate cerebral atrophy is unchanged. Periventricular white matter hypodensities are unchanged and nonspecific but compatible with chronic small vessel ischemic disease, mild for age. Vascular: Calcified atherosclerosis at the skullbase. No hyperdense vessel. Skull: No fracture or suspicious osseous lesion. Sinuses/Orbits: Partially visualized chronic right maxillary sinusitis. Clear mastoid air cells. No acute abnormality in the imaged portions of the orbits. Other: Scattered tiny locules of gas in the cavernous sinuses and infratemporal fossae, possibly related to recent venipuncture. IMPRESSION: 1. No evidence of acute intracranial abnormality. 2. Unchanged cerebral atrophy and chronic small vessel ischemia. Electronically Signed   By: Jolaine Click.D.  On: 08/14/2016 20:35   Dg Chest Port 1 View  Result Date: 08/14/2016 CLINICAL DATA:  Altered mental status EXAM: PORTABLE CHEST 1 VIEW COMPARISON:  06/19/2015 FINDINGS: Left-sided single lead pacing device with lead over the right ventricle. Mild left greater than right bibasilar opacities, atelectasis versus mild infiltrates. Stable cardiomegaly with atherosclerosis. No pneumothorax. IMPRESSION: Mild left greater than right bibasilar opacities suspicious for infiltrates. Stable mild cardiomegaly Electronically Signed   By: Jasmine Pang M.D.   On: 08/14/2016 21:17    2D ECHO: Study Conclusions  -  Left ventricle: The cavity size was normal. Wall thickness was   increased in a pattern of mild LVH. Systolic function was   moderately reduced. The estimated ejection fraction was in the   range of 35% to 40%. Akinesis of the lateral and inferolateral   myocardium. - Mitral valve: There was moderate to severe regurgitation. - Left atrium: The atrium was moderately to severely dilated. - Right atrium: The atrium was dilated. - Pulmonary arteries: Systolic pressure was severely increased. PA   peak pressure: 89 mm Hg (S).   Disposition and Follow-up:    DISPOSITION:  Residential hospice   DISCHARGE FOLLOW-UP  comfort care   Time spent on Discharge: 25 minutes  Signed:   RAI,RIPUDEEP M.D. [facility] 08/24/2016, 10:47 AM Pager: [phone]

## 2016-08-24 NOTE — Clinical Social Work Note (Signed)
Clinical Social Worker facilitated patient discharge including contacting patient family and facility to confirm patient discharge plans.  Clinical information faxed to facility and family agreeable with plan.  CSW arranged ambulance transport via [facility] to [facility].  RN to call report prior to discharge.  Clinical Social Worker will sign off for now as social work intervention is no longer needed. Please consult us again if new need arises.  Jesse Andreika Vandagriff, LCSW [phone] 

## 2016-08-24 NOTE — Progress Notes (Signed)
[facility] [place] [facility] here.

## 2016-08-26 NOTE — Consult Note (Signed)
            [facility] CM Primary Care Navigator  08/26/2016  [name] 04-12-1919 [id number]    Attempted to see patient at the bedside to identify possible discharge needsbut staff reports that patient had been discharged [facility] this weekend. Palliative medicine was consulted, given advanced dementia and poor functional status, patient's family was in agreement with hospice facility.    For additional questions please contact:  Karin GoldenLorraine A. Fe Okubo, BSN, RN-BC [facility] PRIMARY CARE Navigator Cell: [phone]

## 2016-09-05 DEATH — deceased

## 2017-06-03 IMAGING — CT CT HEAD W/O CM
1 series · 16 of 29 positions shown, 20 images · non-contrast
Comparison: 06/11/2014, prior MRI 08/05/2011

CLINICAL DATA: [AGE] male with a history of weakness

EXAM:
CT HEAD WITHOUT CONTRAST
TECHNIQUE: Contiguous axial images were obtained from the base of the skull
through the vertex without intravenous contrast.

[Series 2: head 5.0 h30s · axial · 0.47mm/px · z∈[+1261,+1391]mm · 16 of 29 slices shown, 20 images]
[im 2/29  brain]
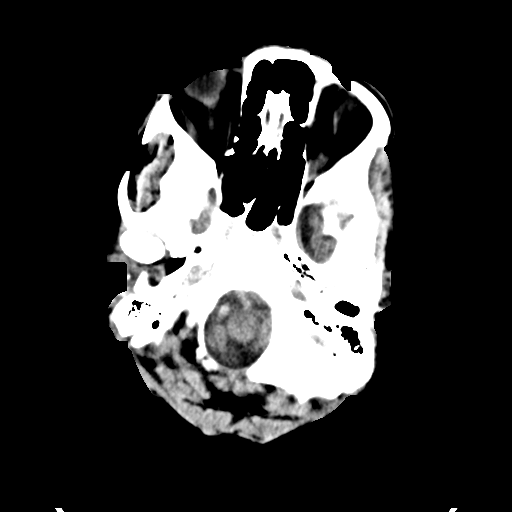
[im 2/29  bone]
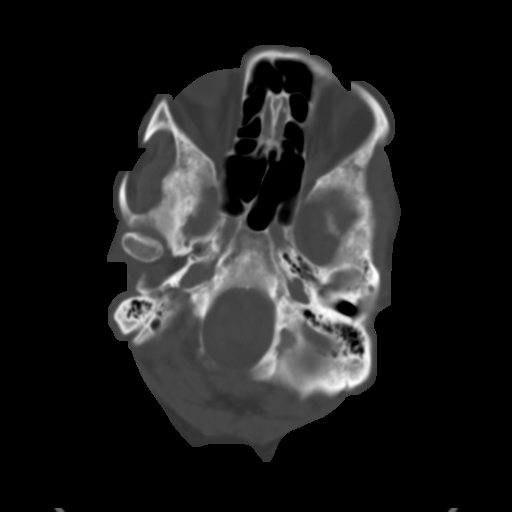
[im 4/29  brain]
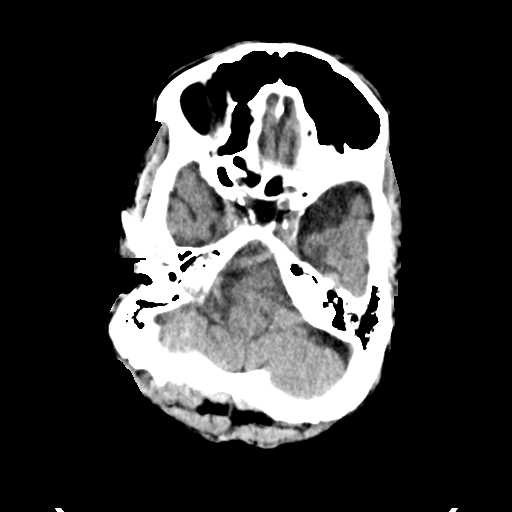
[im 6/29  brain]
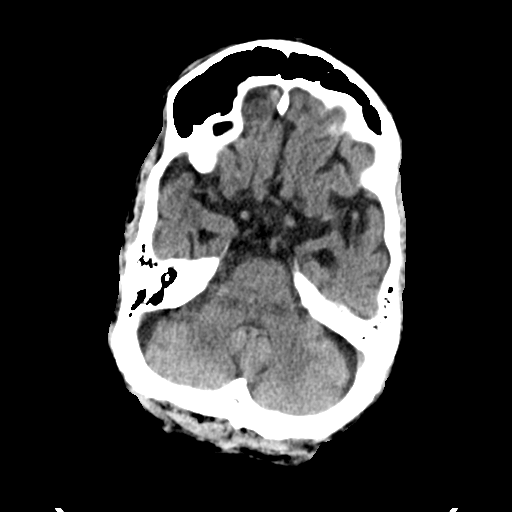
[im 7/29  brain]
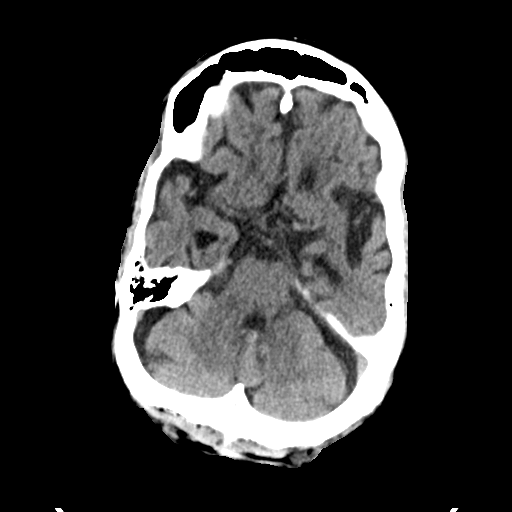
[im 9/29  brain]
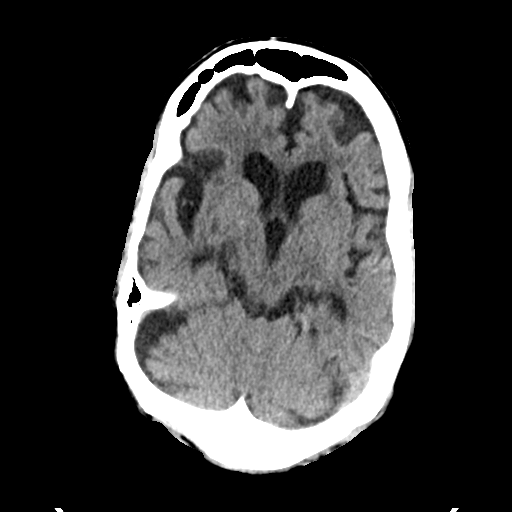
[im 9/29  bone]
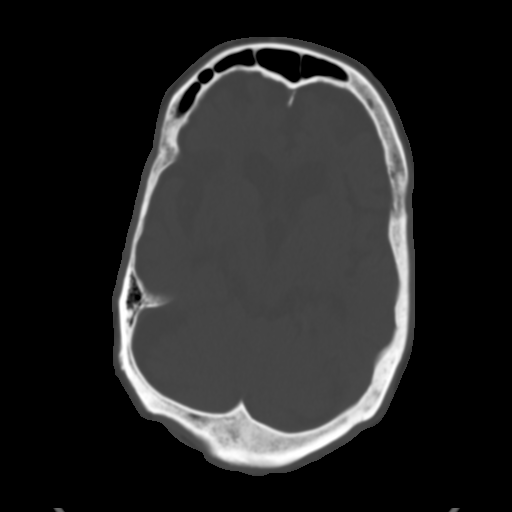
[im 11/29  brain]
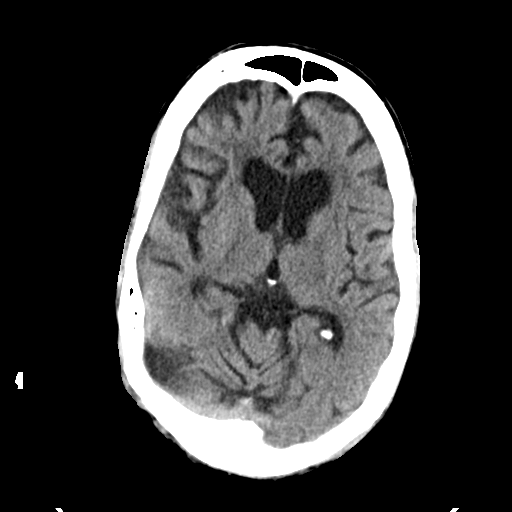
[im 12/29  brain]
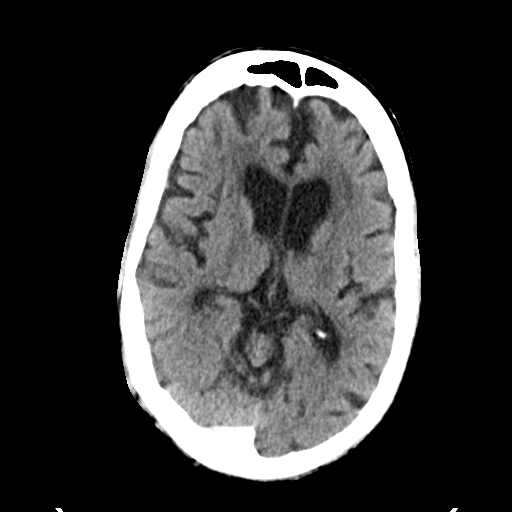
[im 14/29  brain]
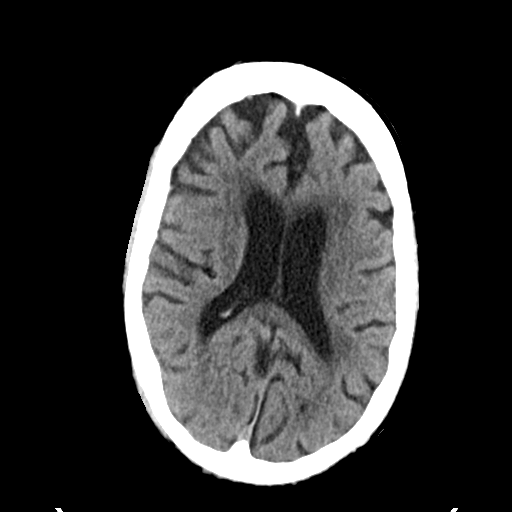
[im 16/29  brain]
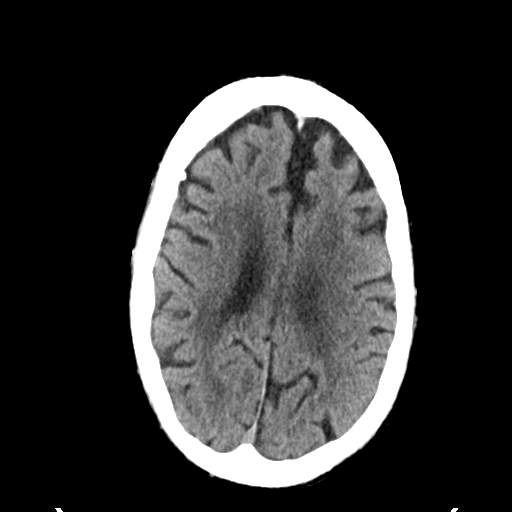
[im 16/29  bone]
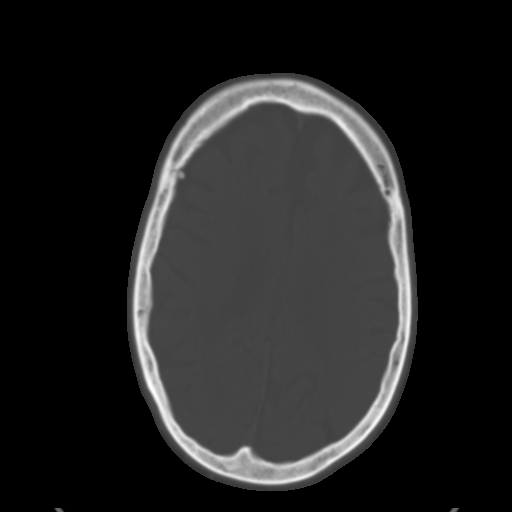
[im 18/29  brain]
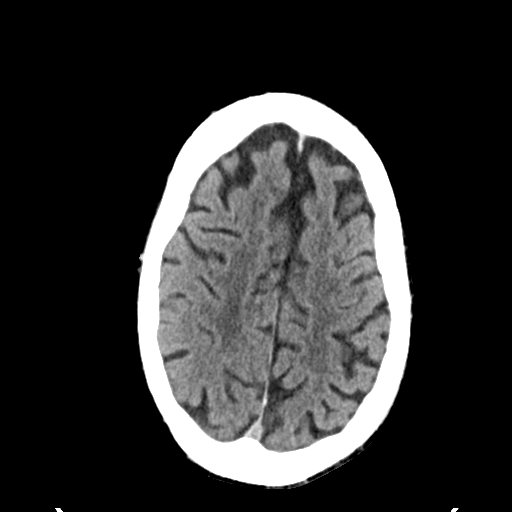
[im 19/29  brain]
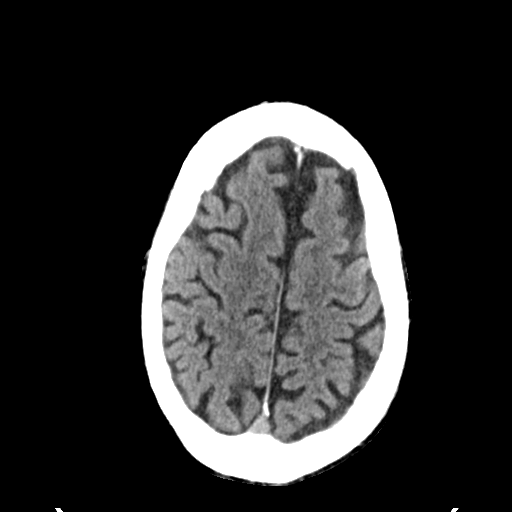
[im 21/29  brain]
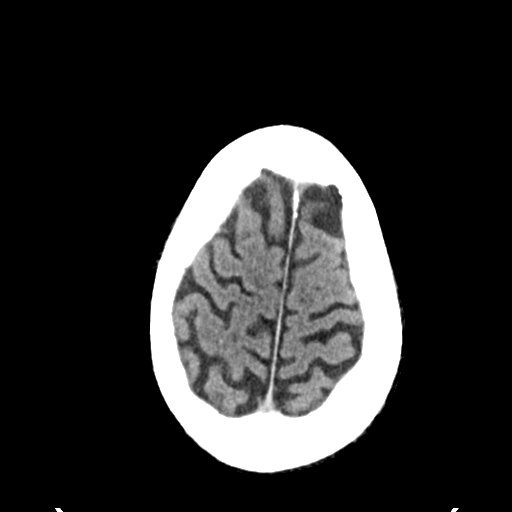
[im 23/29  brain]
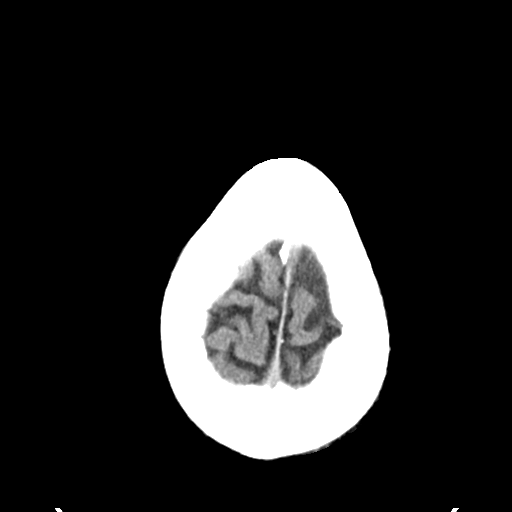
[im 23/29  bone]
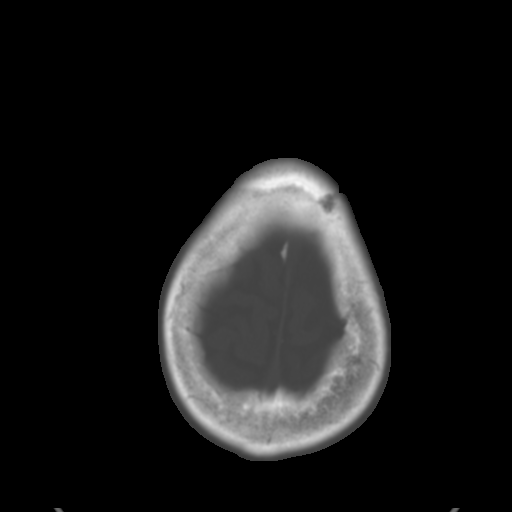
[im 24/29  brain]
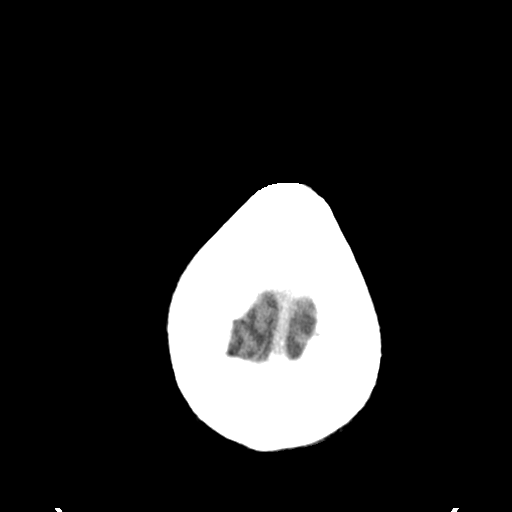
[im 26/29  brain]
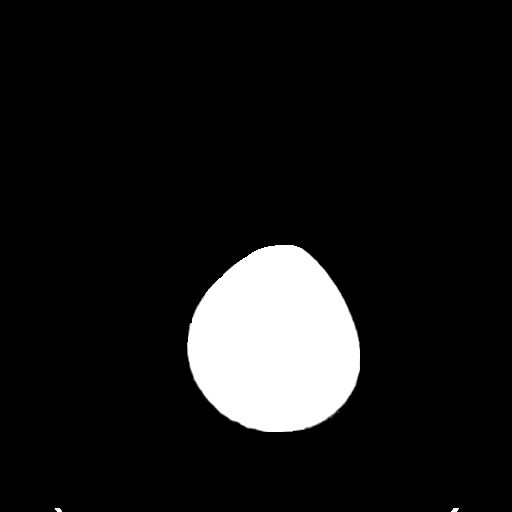
[im 28/29  brain]
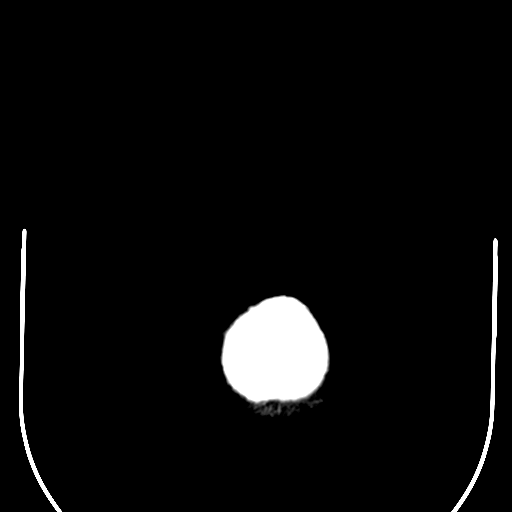

[16 of 29 positions shown; findings below may reference images not displayed]

FINDINGS: Unremarkable appearance of the calvarium without acute fracture or
aggressive lesion.

Unremarkable appearance of the scalp soft tissues.

Unremarkable appearance of the bilateral orbits.

Mastoid air cells are clear.

No significant paranasal sinus disease

No acute intracranial hemorrhage.  No midline shift or mass effect.

Diffuse brain volume loss.

Configuration the ventricles is unchanged, with dilation that is not
out of proportion to the degree of sulcal prominence. Confluent
hypodensity in the periventricular white matter.

Gray-white differentiation is maintained. Intracranial
atherosclerotic calcifications.
IMPRESSION: No CT evidence of acute intracranial abnormality.

Brain volume loss with changes of chronic microvascular ischemic
disease, similar to prior.
